# Patient Record
Sex: Female | Born: 1985 | Race: White | Hispanic: No | Smoking: Never smoker
Health system: Southern US, Community
[De-identification: ages and names within clinical notes are randomized; demographics above are authoritative.]

## PROBLEM LIST (undated history)

## (undated) DIAGNOSIS — F99 Mental disorder, not otherwise specified: Secondary | ICD-10-CM

## (undated) DIAGNOSIS — J45909 Unspecified asthma, uncomplicated: Secondary | ICD-10-CM

## (undated) DIAGNOSIS — F32A Depression, unspecified: Secondary | ICD-10-CM

## (undated) DIAGNOSIS — T7840XA Allergy, unspecified, initial encounter: Secondary | ICD-10-CM

## (undated) DIAGNOSIS — Z9889 Other specified postprocedural states: Secondary | ICD-10-CM

## (undated) DIAGNOSIS — F419 Anxiety disorder, unspecified: Secondary | ICD-10-CM

## (undated) DIAGNOSIS — R87629 Unspecified abnormal cytological findings in specimens from vagina: Secondary | ICD-10-CM

## (undated) HISTORY — DX: Unspecified asthma, uncomplicated: J45.909

## (undated) HISTORY — PX: WISDOM TOOTH EXTRACTION: SHX21

## (undated) HISTORY — DX: Allergy, unspecified, initial encounter: T78.40XA

## (undated) HISTORY — DX: Unspecified abnormal cytological findings in specimens from vagina: R87.629

## (undated) HISTORY — DX: Depression, unspecified: F32.A

## (undated) HISTORY — DX: Anxiety disorder, unspecified: F41.9

## (undated) HISTORY — DX: Mental disorder, not otherwise specified: F99

## (undated) HISTORY — PX: BUNIONECTOMY: SHX129

## (undated) HISTORY — DX: Other specified postprocedural states: Z98.890

---

## 2009-09-20 ENCOUNTER — Ambulatory Visit: Payer: Self-pay | Admitting: Psychiatry

## 2009-09-25 ENCOUNTER — Ambulatory Visit (HOSPITAL_COMMUNITY): Admission: RE | Admit: 2009-09-25 | Discharge: 2009-09-25 | Payer: Self-pay | Admitting: Psychiatry

## 2009-09-26 ENCOUNTER — Other Ambulatory Visit (HOSPITAL_COMMUNITY): Admission: RE | Admit: 2009-09-26 | Discharge: 2009-10-05 | Payer: Self-pay | Admitting: Psychiatry

## 2009-09-28 ENCOUNTER — Ambulatory Visit: Payer: Self-pay | Admitting: Psychiatry

## 2010-06-29 ENCOUNTER — Emergency Department (HOSPITAL_COMMUNITY)
Admission: EM | Admit: 2010-06-29 | Discharge: 2010-06-29 | Disposition: A | Discharge status: Home / Self Care | Payer: PRIVATE HEALTH INSURANCE | Attending: Emergency Medicine | Admitting: Emergency Medicine

## 2010-06-29 DIAGNOSIS — R0609 Other forms of dyspnea: Secondary | ICD-10-CM | POA: Insufficient documentation

## 2010-06-29 DIAGNOSIS — F319 Bipolar disorder, unspecified: Secondary | ICD-10-CM | POA: Insufficient documentation

## 2010-06-29 DIAGNOSIS — R0989 Other specified symptoms and signs involving the circulatory and respiratory systems: Secondary | ICD-10-CM | POA: Insufficient documentation

## 2010-06-29 DIAGNOSIS — J45909 Unspecified asthma, uncomplicated: Secondary | ICD-10-CM | POA: Insufficient documentation

## 2010-06-29 DIAGNOSIS — J3489 Other specified disorders of nose and nasal sinuses: Secondary | ICD-10-CM | POA: Insufficient documentation

## 2010-06-29 DIAGNOSIS — Z79899 Other long term (current) drug therapy: Secondary | ICD-10-CM | POA: Insufficient documentation

## 2018-05-06 ENCOUNTER — Ambulatory Visit (INDEPENDENT_AMBULATORY_CARE_PROVIDER_SITE_OTHER): Payer: BC Managed Care – PPO | Admitting: Advanced Practice Midwife

## 2018-05-06 ENCOUNTER — Encounter: Payer: Self-pay | Admitting: Advanced Practice Midwife

## 2018-05-06 VITALS — BP 133/85 | HR 102 | Ht 65.0 in | Wt 252.0 lb

## 2018-05-06 DIAGNOSIS — N898 Other specified noninflammatory disorders of vagina: Secondary | ICD-10-CM | POA: Diagnosis not present

## 2018-05-06 DIAGNOSIS — N760 Acute vaginitis: Secondary | ICD-10-CM

## 2018-05-06 DIAGNOSIS — Z1151 Encounter for screening for human papillomavirus (HPV): Secondary | ICD-10-CM | POA: Diagnosis not present

## 2018-05-06 DIAGNOSIS — Z01419 Encounter for gynecological examination (general) (routine) without abnormal findings: Secondary | ICD-10-CM | POA: Diagnosis not present

## 2018-05-06 DIAGNOSIS — Z3009 Encounter for other general counseling and advice on contraception: Secondary | ICD-10-CM | POA: Insufficient documentation

## 2018-05-06 DIAGNOSIS — Z124 Encounter for screening for malignant neoplasm of cervix: Secondary | ICD-10-CM | POA: Diagnosis not present

## 2018-05-06 DIAGNOSIS — Z113 Encounter for screening for infections with a predominantly sexual mode of transmission: Secondary | ICD-10-CM | POA: Diagnosis not present

## 2018-05-06 DIAGNOSIS — B9689 Other specified bacterial agents as the cause of diseases classified elsewhere: Secondary | ICD-10-CM

## 2018-05-06 MED ORDER — NORGESTIMATE-ETH ESTRADIOL 0.25-35 MG-MCG PO TABS: 1.0000 | ORAL_TABLET | Freq: Every day | ORAL | 11 refills | Status: DC

## 2018-05-06 NOTE — Patient Instructions (Signed)
Preventive Care 18-39 Years, Female Preventive care refers to lifestyle choices and visits with your health care provider that can promote health and wellness. What does preventive care include?   A yearly physical exam. This is also called an annual well check.  Dental exams once or twice a year.  Routine eye exams. Ask your health care provider how often you should have your eyes checked.  Personal lifestyle choices, including: ? Daily care of your teeth and gums. ? Regular physical activity. ? Eating a healthy diet. ? Avoiding tobacco and drug use. ? Limiting alcohol use. ? Practicing safe sex. ? Taking vitamin and mineral supplements as recommended by your health care provider. What happens during an annual well check? The services and screenings done by your health care provider during your annual well check will depend on your age, overall health, lifestyle risk factors, and family history of disease. Counseling Your health care provider may ask you questions about your:  Alcohol use.  Tobacco use.  Drug use.  Emotional well-being.  Home and relationship well-being.  Sexual activity.  Eating habits.  Work and work environment.  Method of birth control.  Menstrual cycle.  Pregnancy history. Screening You may have the following tests or measurements:  Height, weight, and BMI.  Diabetes screening. This is done by checking your blood sugar (glucose) after you have not eaten for a while (fasting).  Blood pressure.  Lipid and cholesterol levels. These may be checked every 5 years starting at age 20.  Skin check.  Hepatitis C blood test.  Hepatitis B blood test.  Sexually transmitted disease (STD) testing.  BRCA-related cancer screening. This may be done if you have a family history of breast, ovarian, tubal, or peritoneal cancers.  Pelvic exam and Pap test. This may be done every 3 years starting at age 21. Starting at age 30, this may be done every 5  years if you have a Pap test in combination with an HPV test. Discuss your test results, treatment options, and if necessary, the need for more tests with your health care provider. Vaccines Your health care provider may recommend certain vaccines, such as:  Influenza vaccine. This is recommended every year.  Tetanus, diphtheria, and acellular pertussis (Tdap, Td) vaccine. You may need a Td booster every 10 years.  Varicella vaccine. You may need this if you have not been vaccinated.  HPV vaccine. If you are 26 or younger, you may need three doses over 6 months.  Measles, mumps, and rubella (MMR) vaccine. You may need at least one dose of MMR. You may also need a second dose.  Pneumococcal 13-valent conjugate (PCV13) vaccine. You may need this if you have certain conditions and were not previously vaccinated.  Pneumococcal polysaccharide (PPSV23) vaccine. You may need one or two doses if you smoke cigarettes or if you have certain conditions.  Meningococcal vaccine. One dose is recommended if you are age 19-21 years and a first-year college student living in a residence hall, or if you have one of several medical conditions. You may also need additional booster doses.  Hepatitis A vaccine. You may need this if you have certain conditions or if you travel or work in places where you may be exposed to hepatitis A.  Hepatitis B vaccine. You may need this if you have certain conditions or if you travel or work in places where you may be exposed to hepatitis B.  Haemophilus influenzae type b (Hib) vaccine. You may need this if you   have certain risk factors. Talk to your health care provider about which screenings and vaccines you need and how often you need them. This information is not intended to replace advice given to you by your health care provider. Make sure you discuss any questions you have with your health care provider. Document Released: 05/14/2001 Document Revised: 10/29/2016  Document Reviewed: 01/17/2015 Elsevier Interactive Patient Education  2019 [employer].

## 2018-05-06 NOTE — Progress Notes (Signed)
GYNECOLOGY ANNUAL PREVENTATIVE CARE ENCOUNTER NOTE  Subjective:   Yolanda Tyler is a 33 y.o. G0P0000 female here for a routine annual gynecologic exam.  Current complaints: none.   Denies abnormal vaginal bleeding, discharge, pelvic pain, problems with intercourse or other gynecologic concerns.    Patient endorses positive relationships and happiness regarding her sexual health. She has separate female and female partners, not currently in a relationship but has regular known partners. Not sure of partner testing status. She is a non-smoker, does not vape. Denies IPV.  Patient does not currently use birth control but is interested in starting it. She does not consider IUDs an option as she thinks her female partner will not have a positive response to it.  Gynecologic History Patient's last menstrual period was 04/22/2018 (within days). Contraception: none Last Pap: Remote. Results were: normal with negative HPV, Patient states prior pap was HPV positive Last mammogram: N/A age 33.   Obstetric History OB History  Gravida Para Term Preterm AB Living  0 0 0 0 0 0  SAB TAB Ectopic Multiple Live Births  0 0 0 0 0    Past Medical History:  Diagnosis Date  . History of colposcopy   . Mental disorder    depression and anxiety  . Vaginal Pap smear, abnormal     Past Surgical History:  Procedure Laterality Date  . BUNIONECTOMY    . WISDOM TOOTH EXTRACTION      Current Outpatient Medications on File Prior to Visit  Medication Sig Dispense Refill  . buPROPion (WELLBUTRIN XL) 300 MG 24 hr tablet Take 300 mg by mouth daily.    Marland Kitchen. desvenlafaxine (PRISTIQ) 50 MG 24 hr tablet Take 50 mg by mouth daily.    Marland Kitchen. lamoTRIgine (LAMICTAL) 100 MG tablet Take 100 mg by mouth daily.     No current facility-administered medications on file prior to visit.     Allergies  Allergen Reactions  . Penicillins Hives    Social History:  reports that she has never smoked. She has never used  smokeless tobacco. She reports previous alcohol use. She reports previous drug use.  History reviewed. No pertinent family history.  The following portions of the patient's history were reviewed and updated as appropriate: allergies, current medications, past family history, past medical history, past social history, past surgical history and problem list.  Review of Systems Pertinent items noted in HPI and remainder of comprehensive ROS otherwise negative.   Objective:  BP 133/85   Pulse (!) 102   Ht 5\' 5"  (1.651 m)   Wt 252 lb (114.3 kg)   LMP 04/22/2018 (Within Days)   BMI 41.93 kg/m  CONSTITUTIONAL: Well-developed, well-nourished female in no acute distress.  HENT:  Normocephalic, atraumatic, External right and left ear normal. Oropharynx is clear and moist EYES: Conjunctivae and EOM are normal. Pupils are equal, round, and reactive to light. No scleral icterus.  NECK: Normal range of motion, supple, no masses.  Normal thyroid.  SKIN: Skin is warm and dry. No rash noted. Not diaphoretic. No erythema. No pallor. MUSCULOSKELETAL: Normal range of motion. No tenderness.  No cyanosis, clubbing, or edema.  2+ distal pulses. NEUROLOGIC: Alert and oriented to person, place, and time. Normal reflexes, muscle tone coordination. No cranial nerve deficit noted. PSYCHIATRIC: Normal mood and affect. Normal behavior. Normal judgment and thought content. CARDIOVASCULAR: Normal heart rate noted, regular rhythm RESPIRATORY: Clear to auscultation bilaterally. Effort and breath sounds normal, no problems with respiration noted. BREASTS: Symmetric  in size. No masses, skin changes, nipple drainage, or lymphadenopathy. ABDOMEN: Soft, normal bowel sounds, no distention noted.  No tenderness, rebound or guarding.  PELVIC: Normal appearing external genitalia; normal appearing vaginal mucosa and cervix.  No abnormal discharge noted.  Pap smear obtained.  Normal uterine size, no other palpable masses, no  uterine or adnexal tenderness.    Assessment and Plan:  1. Well woman exam with routine gynecological exam - Reviewed all forms of birth control options available based on history and BMI as advised by CDC, focusing on IUD, OCPs and condoms per patient request. Risks and benefits reviewed.  Questions were answered.  Information was given to patient to review.  - Rx Sprintec - Cytology - PAP( [place]) - Hepatitis B surface antigen - Hepatitis C antibody - HIV Antibody (routine testing w rflx) - RPR - Cytology - PAP  Will follow up results of pap smear and manage accordingly. Routine preventative health maintenance measures emphasized. Please refer to After Visit Summary for other counseling recommendations.   Clayton BiblesSamantha Jakerria Kingbird, Certified Nurse Midwife [facility], [facility]

## 2018-05-06 NOTE — Progress Notes (Signed)
Would like STD testing Last PAP was 2014 was normal, had previous Pap that was abnormal had a colposcopy done and had normal Pap after.

## 2018-05-07 LAB — HEPATITIS B SURFACE ANTIGEN: Hepatitis B Surface Ag: NEGATIVE

## 2018-05-07 LAB — HIV ANTIBODY (ROUTINE TESTING W REFLEX): HIV Screen 4th Generation wRfx: NONREACTIVE

## 2018-05-07 LAB — HEPATITIS C ANTIBODY

## 2018-05-07 LAB — RPR: RPR Ser Ql: NONREACTIVE

## 2018-05-11 ENCOUNTER — Telehealth: Payer: Self-pay

## 2018-05-11 LAB — CYTOLOGY - PAP
ADEQUACY: ABSENT
Bacterial vaginitis: POSITIVE — AB
Candida vaginitis: NEGATIVE
Chlamydia: NEGATIVE
Diagnosis: NEGATIVE
HPV: NOT DETECTED
Neisseria Gonorrhea: NEGATIVE
Trichomonas: NEGATIVE

## 2018-05-11 MED ORDER — METRONIDAZOLE 500 MG PO TABS: 500.0000 mg | ORAL_TABLET | Freq: Two times a day (BID) | ORAL | 0 refills | Status: DC

## 2018-05-11 NOTE — Telephone Encounter (Signed)
-----   Message from Calvert Cantor, [place] sent at 05/11/2018  9:47 AM EST ----- Regarding: FW: Please notify patient of BV and ask if she'd like treatment. If so she can have flagyl or she can just manage with lactobacillus supplement. Thanks! ----- Message ----- From: [facility] Lab Results In Sent: 05/07/2018   5:41 AM EST To: Calvert Cantor, CNM

## 2018-05-11 NOTE — Telephone Encounter (Signed)
Patient has BV. I have left a message for her to call us back regarding results.

## 2019-03-10 ENCOUNTER — Encounter: Payer: Self-pay | Admitting: Radiology

## 2019-12-29 ENCOUNTER — Ambulatory Visit (INDEPENDENT_AMBULATORY_CARE_PROVIDER_SITE_OTHER): Payer: BC Managed Care – PPO | Admitting: Advanced Practice Midwife

## 2019-12-29 ENCOUNTER — Encounter: Payer: Self-pay | Admitting: Advanced Practice Midwife

## 2019-12-29 ENCOUNTER — Other Ambulatory Visit: Payer: Self-pay

## 2019-12-29 VITALS — BP 148/83 | HR 88 | Ht 65.0 in | Wt 269.8 lb

## 2019-12-29 DIAGNOSIS — Z0000 Encounter for general adult medical examination without abnormal findings: Secondary | ICD-10-CM | POA: Diagnosis not present

## 2019-12-29 DIAGNOSIS — R030 Elevated blood-pressure reading, without diagnosis of hypertension: Secondary | ICD-10-CM

## 2019-12-29 DIAGNOSIS — Z6841 Body Mass Index (BMI) 40.0 and over, adult: Secondary | ICD-10-CM

## 2019-12-29 DIAGNOSIS — Z319 Encounter for procreative management, unspecified: Secondary | ICD-10-CM

## 2019-12-29 DIAGNOSIS — E66813 Obesity, class 3: Secondary | ICD-10-CM

## 2019-12-29 NOTE — Patient Instructions (Signed)
Preventive Care 21-33 Years Old, Female Preventive care refers to visits with your health care provider and lifestyle choices that can promote health and wellness. This includes:  A yearly physical exam. This may also be called an annual well check.  Regular dental visits and eye exams.  Immunizations.  Screening for certain conditions.  Healthy lifestyle choices, such as eating a healthy diet, getting regular exercise, not using drugs or products that contain nicotine and tobacco, and limiting alcohol use. What can I expect for my preventive care visit? Physical exam Your health care provider will check your:  Height and weight. This may be used to calculate body mass index (BMI), which tells if you are at a healthy weight.  Heart rate and blood pressure.  Skin for abnormal spots. Counseling Your health care provider may ask you questions about your:  Alcohol, tobacco, and drug use.  Emotional well-being.  Home and relationship well-being.  Sexual activity.  Eating habits.  Work and work environment.  Method of birth control.  Menstrual cycle.  Pregnancy history. What immunizations do I need?  Influenza (flu) vaccine  This is recommended every year. Tetanus, diphtheria, and pertussis (Tdap) vaccine  You may need a Td booster every 10 years. Varicella (chickenpox) vaccine  You may need this if you have not been vaccinated. Human papillomavirus (HPV) vaccine  If recommended by your health care provider, you may need three doses over 6 months. Measles, mumps, and rubella (MMR) vaccine  You may need at least one dose of MMR. You may also need a second dose. Meningococcal conjugate (MenACWY) vaccine  One dose is recommended if you are age 19-21 years and a first-year college student living in a residence hall, or if you have one of several medical conditions. You may also need additional booster doses. Pneumococcal conjugate (PCV13) vaccine  You may need  this if you have certain conditions and were not previously vaccinated. Pneumococcal polysaccharide (PPSV23) vaccine  You may need one or two doses if you smoke cigarettes or if you have certain conditions. Hepatitis A vaccine  You may need this if you have certain conditions or if you travel or work in places where you may be exposed to hepatitis A. Hepatitis B vaccine  You may need this if you have certain conditions or if you travel or work in places where you may be exposed to hepatitis B. Haemophilus influenzae type b (Hib) vaccine  You may need this if you have certain conditions. You may receive vaccines as individual doses or as more than one vaccine together in one shot (combination vaccines). Talk with your health care provider about the risks and benefits of combination vaccines. What tests do I need?  Blood tests  Lipid and cholesterol levels. These may be checked every 5 years starting at age 20.  Hepatitis C test.  Hepatitis B test. Screening  Diabetes screening. This is done by checking your blood sugar (glucose) after you have not eaten for a while (fasting).  Sexually transmitted disease (STD) testing.  BRCA-related cancer screening. This may be done if you have a family history of breast, ovarian, tubal, or peritoneal cancers.  Pelvic exam and Pap test. This may be done every 3 years starting at age 21. Starting at age 30, this may be done every 5 years if you have a Pap test in combination with an HPV test. Talk with your health care provider about your test results, treatment options, and if necessary, the need for more tests.   Follow these instructions at home: °Eating and drinking ° °· Eat a diet that includes fresh fruits and vegetables, whole grains, lean protein, and low-fat dairy. °· Take vitamin and mineral supplements as recommended by your health care provider. °· Do not drink alcohol if: °? Your health care provider tells you not to drink. °? You are  pregnant, may be pregnant, or are planning to become pregnant. °· If you drink alcohol: °? Limit how much you have to 0-1 drink a day. °? Be aware of how much alcohol is in your drink. In the [place] one drink equals one 12 oz bottle of beer (355 mL), one 5 oz glass of wine (148 mL), or one 1½ oz glass of hard liquor (44 mL). °Lifestyle °· Take daily care of your teeth and gums. °· Stay active. Exercise for at least 30 minutes on 5 or more days each week. °· Do not use any products that contain nicotine or tobacco, such as cigarettes, e-cigarettes, and chewing tobacco. If you need help quitting, ask your health care provider. °· If you are sexually active, practice safe sex. Use a condom or other form of birth control (contraception) in order to prevent pregnancy and STIs (sexually transmitted infections). If you plan to become pregnant, see your health care provider for a preconception visit. °What's next? °· Visit your health care provider once a year for a well check visit. °· Ask your health care provider how often you should have your eyes and teeth checked. °· Stay up to date on all vaccines. °This information is not intended to replace advice given to you by your health care provider. Make sure you discuss any questions you have with your health care provider. °Document Revised: 11/27/2017 Document Reviewed: 11/27/2017 °Elsevier Patient Education © 2020 Elsevier Inc. ° °

## 2019-12-29 NOTE — Progress Notes (Signed)
GYNECOLOGY ANNUAL PREVENTATIVE CARE ENCOUNTER NOTE  History:     Yolanda Tyler is a 34 y.o. G0P0000 female here for a routine annual gynecologic exam.  Current complaints: None.  Desires pregnancy in 2022. Female partner, monogamous x 2 years. Never took Sprintec due to [employer] and absence of female partner. Denies abnormal vaginal bleeding, discharge, pelvic pain, problems with intercourse or other gynecologic concerns.    Gynecologic History Patient's last menstrual period was 12/21/2019 (approximate). Contraception: none Last Pap: 05/06/2018. Results were: normal with negative HPV Last mammogram: N/A age 45.  Obstetric History OB History  Gravida Para Term Preterm AB Living  0 0 0 0 0 0  SAB TAB Ectopic Multiple Live Births  0 0 0 0 0    Past Medical History:  Diagnosis Date  . History of colposcopy   . Mental disorder    depression and anxiety  . Vaginal Pap smear, abnormal     Past Surgical History:  Procedure Laterality Date  . BUNIONECTOMY    . WISDOM TOOTH EXTRACTION      Current Outpatient Medications on File Prior to Visit  Medication Sig Dispense Refill  . buPROPion (WELLBUTRIN XL) 300 MG 24 hr tablet Take 300 mg by mouth daily.    Marland Kitchen desvenlafaxine (PRISTIQ) 50 MG 24 hr tablet Take 50 mg by mouth daily.    Marland Kitchen lamoTRIgine (LAMICTAL) 100 MG tablet Take 100 mg by mouth daily.    . traZODone (DESYREL) 100 MG tablet Take 100 mg by mouth at bedtime.    . metroNIDAZOLE (FLAGYL) 500 MG tablet Take 1 tablet (500 mg total) by mouth 2 (two) times daily. 14 tablet 0  . norgestimate-ethinyl estradiol (ORTHO-CYCLEN,SPRINTEC,PREVIFEM) 0.25-35 MG-MCG tablet Take 1 tablet by mouth daily. 1 Package 11   No current facility-administered medications on file prior to visit.    Allergies  Allergen Reactions  . Metformin And Related Diarrhea  . Penicillins Hives    Social History:  reports that she has never smoked. She has never used smokeless tobacco. She  reports previous alcohol use. She reports previous drug use.  No family history on file.  The following portions of the patient's history were reviewed and updated as appropriate: allergies, current medications, past family history, past medical history, past social history, past surgical history and problem list.  Review of Systems Pertinent items noted in HPI and remainder of comprehensive ROS otherwise negative.  Physical Exam:  BP (!) 148/83   Pulse 88   Ht 5\' 5"  (1.651 m)   Wt 269 lb 12.8 oz (122.4 kg)   LMP 12/21/2019 (Approximate)   BMI 44.90 kg/m  CONSTITUTIONAL: Well-developed, well-nourished female in no acute distress.  HENT:  Normocephalic, atraumatic, External right and left ear normal. Oropharynx is clear and moist EYES: Conjunctivae and EOM are normal. Pupils are equal, round, and reactive to light. No scleral icterus.  NECK: Normal range of motion, supple, no masses.  Normal thyroid.  SKIN: Skin is warm and dry. No rash noted. Not diaphoretic. No erythema. No pallor. MUSCULOSKELETAL: Normal range of motion. No tenderness.  No cyanosis, clubbing, or edema.  2+ distal pulses. NEUROLOGIC: Alert and oriented to person, place, and time. Normal reflexes, muscle tone coordination.  PSYCHIATRIC: Normal mood and affect. Normal behavior. Normal judgment and thought content. CARDIOVASCULAR: Normal heart rate noted, regular rhythm RESPIRATORY: Clear to auscultation bilaterally. Effort and breath sounds normal, no problems with respiration noted. BREASTS: Symmetric in size. No masses, tenderness, skin changes, nipple  drainage, or lymphadenopathy bilaterally. Performed in the presence of a chaperone. ABDOMEN: Soft, no distention noted.  No tenderness, rebound or guarding.    Assessment and Plan:    1. Well woman exam (no gynecological exam) - No concerning findings on physical exam - Pap not due, no GU complaints, pelvic deferred - Encouraged breast self exams - CBC - TSH -  Hemoglobin A1c - Comprehensive metabolic panel - Lipid panel - VITAMIN D 25 Hydroxy (Vit-D Deficiency, Fractures)   2. Class 3 severe obesity without serious comorbidity with body mass index (BMI) of 40.0 to 44.9 in adult, unspecified obesity type (HCC) - Needs PCP - Referral to Nutrition and Diabetes Services - Ambulatory referral to [facility]  3. Elevated blood pressure reading in office without diagnosis of hypertension   4. Patient desires pregnancy --Discussed methods for reducing risk factors before she starts TTC in 2022  Total visit time: 30 minutes. Greater than 50% of visit spent in counseling and coordination of care  Routine preventative health maintenance measures emphasized. Please refer to After Visit Summary for other counseling recommendations.      Clayton Bibles, MSN, CNM Certified Nurse Midwife, [facility] for [employer], [facility] 12/29/19 9:14 AM

## 2019-12-30 ENCOUNTER — Telehealth: Payer: Self-pay

## 2019-12-30 LAB — COMPREHENSIVE METABOLIC PANEL
ALT: 28 IU/L (ref 0–32)
AST: 17 IU/L (ref 0–40)
Albumin/Globulin Ratio: 1.7 (ref 1.2–2.2)
Albumin: 4.3 g/dL (ref 3.8–4.8)
Alkaline Phosphatase: 80 IU/L (ref 44–121)
BUN/Creatinine Ratio: 9 (ref 9–23)
BUN: 7 mg/dL (ref 6–20)
Bilirubin Total: 0.4 mg/dL (ref 0.0–1.2)
CO2: 23 mmol/L (ref 20–29)
Calcium: 9.2 mg/dL (ref 8.7–10.2)
Chloride: 99 mmol/L (ref 96–106)
Creatinine, Ser: 0.78 mg/dL (ref 0.57–1.00)
GFR calc Af Amer: 116 mL/min/{1.73_m2} (ref >59)
GFR calc non Af Amer: 100 mL/min/{1.73_m2} (ref >59)
Globulin, Total: 2.6 g/dL (ref 1.5–4.5)
Glucose: 107 mg/dL — ABNORMAL HIGH (ref 65–99)
Potassium: 4.2 mmol/L (ref 3.5–5.2)
Sodium: 137 mmol/L (ref 134–144)
Total Protein: 6.9 g/dL (ref 6.0–8.5)

## 2019-12-30 LAB — CBC
Hematocrit: 39.9 % (ref 34.0–46.6)
Hemoglobin: 12.7 g/dL (ref 11.1–15.9)
MCH: 27.7 pg (ref 26.6–33.0)
MCHC: 31.8 g/dL (ref 31.5–35.7)
MCV: 87 fL (ref 79–97)
Platelets: 339 10*3/uL (ref 150–450)
RBC: 4.58 x10E6/uL (ref 3.77–5.28)
RDW: 13.1 % (ref 11.7–15.4)
WBC: 7.2 10*3/uL (ref 3.4–10.8)

## 2019-12-30 LAB — LIPID PANEL
Chol/HDL Ratio: 3.5 ratio (ref 0.0–4.4)
Cholesterol, Total: 205 mg/dL — ABNORMAL HIGH (ref 100–199)
HDL: 58 mg/dL (ref >39)
LDL Chol Calc (NIH): 129 mg/dL — ABNORMAL HIGH (ref 0–99)
Triglycerides: 100 mg/dL (ref 0–149)
VLDL Cholesterol Cal: 18 mg/dL (ref 5–40)

## 2019-12-30 LAB — HEMOGLOBIN A1C
Est. average glucose Bld gHb Est-mCnc: 128 mg/dL
Hgb A1c MFr Bld: 6.1 % — ABNORMAL HIGH (ref 4.8–5.6)

## 2019-12-30 LAB — TSH: TSH: 1.37 u[IU]/mL (ref 0.450–4.500)

## 2019-12-30 LAB — VITAMIN D 25 HYDROXY (VIT D DEFICIENCY, FRACTURES): Vit D, 25-Hydroxy: 20.1 ng/mL — ABNORMAL LOW (ref 30.0–100.0)

## 2019-12-30 NOTE — Telephone Encounter (Signed)
Called patient and informed her of results. Patient voiced understanding.

## 2019-12-30 NOTE — Telephone Encounter (Signed)
-----   Message from Calvert Cantor, [place] sent at 12/30/2019  9:04 AM EDT ----- Prediabetic, elevated Cholesterol, already has appointment with Nutrition and referral to establish care with PCP. Can you plz offer daily Vitamin D3 1,000 units daily for low Vitamin D? Thanks! SW

## 2020-01-25 ENCOUNTER — Ambulatory Visit: Payer: BC Managed Care – PPO | Admitting: Skilled Nursing Facility1

## 2020-01-26 ENCOUNTER — Other Ambulatory Visit: Payer: Self-pay

## 2020-01-26 ENCOUNTER — Ambulatory Visit
Admission: RE | Admit: 2020-01-26 | Discharge: 2020-01-26 | Disposition: A | Discharge status: Home / Self Care | Payer: BC Managed Care – PPO | Source: Ambulatory Visit | Attending: Physician Assistant | Admitting: Physician Assistant

## 2020-01-26 ENCOUNTER — Encounter: Payer: Self-pay | Admitting: Physician Assistant

## 2020-01-26 ENCOUNTER — Ambulatory Visit: Payer: BC Managed Care – PPO | Admitting: Physician Assistant

## 2020-01-26 ENCOUNTER — Ambulatory Visit
Admission: RE | Admit: 2020-01-26 | Discharge: 2020-01-26 | Disposition: A | Discharge status: Home / Self Care | Payer: BC Managed Care – PPO | Attending: Physician Assistant | Admitting: Physician Assistant

## 2020-01-26 VITALS — BP 143/88 | HR 90 | Temp 98.2°F | Ht 65.5 in | Wt 262.0 lb

## 2020-01-26 DIAGNOSIS — Z227 Latent tuberculosis: Secondary | ICD-10-CM | POA: Insufficient documentation

## 2020-01-26 DIAGNOSIS — R7303 Prediabetes: Secondary | ICD-10-CM

## 2020-01-26 DIAGNOSIS — R030 Elevated blood-pressure reading, without diagnosis of hypertension: Secondary | ICD-10-CM

## 2020-01-26 DIAGNOSIS — E785 Hyperlipidemia, unspecified: Secondary | ICD-10-CM

## 2020-01-26 MED ORDER — METFORMIN HCL ER 500 MG PO TB24: ORAL_TABLET | ORAL | 0 refills | Status: DC

## 2020-01-26 NOTE — Progress Notes (Signed)
New patient visit   Patient: Yolanda Tyler   DOB: 1986/02/13   34 y.o. Female  MRN: [id number] Visit Date: 01/26/2020  Today's healthcare provider: Trey Sailors, PA-C   Chief Complaint  Patient presents with  . New Patient (Initial Visit)  I,Yolanda Tyler M Yolanda Tyler,acting as a scribe for Trey Sailors, PA-C.,have documented all relevant documentation on the behalf of Trey Sailors, PA-C,as directed by  Trey Sailors, PA-C while in the presence of Trey Sailors, PA-C.  Subjective    Yolanda Tyler is a 34 y.o. female who presents today as a new patient to establish care.  HPI  Patient reports she was seen by her OBGYN doctor and had blood work. She reports that her doctor told her Vitamin D was low and to started OTC vit D medication. Also her blood pressure was a little elevated, cholesterol,lipid and A1C was abnormal/ elevated.  She reports currently taking Vit D supplement OTC 2,000.  History of latent TB, back in [place], one month of treatment but moved to [facility] in March 2017 and stopped treatment .  She currently sees a psychiatrist with [facility] and sees [facility]  and is being treated with wellbutrin, lamictal, and prestiq. She is being treated for depression, dysthymia, anxiety. She sees this provider once a month.   She was recently seen by her OBGYN and diagnosed with high cholesterol, elevated BP and prediabetes. She has an upcoming appointment with nutrition on 01/28/2020 to get on weight loss program. She believes she had a previous trial of metformin but is unable to remember if it was IR or ER. She intends to become pregnant next year.   BP Readings from Last 5 Encounters:  01/26/20 (!) 143/88  12/29/19 (!) 148/83  05/06/18 133/85   Wt Readings from Last 3 Encounters:  01/26/20 262 lb (118.8 kg)  12/29/19 269 lb 12.8 oz (122.4 kg)  05/06/18 252 lb (114.3 kg)   BMI Readings from Last 5 Encounters:  01/26/20 42.94 kg/m  12/29/19 44.90  kg/m  05/06/18 41.93 kg/m     Last vitamin D Lab Results  Component Value Date   VD25OH 20.1 (L) 12/29/2019   Lab Results  Component Value Date   CHOL 205 (H) 12/29/2019   Lab Results  Component Value Date   HDL 58 12/29/2019   Lab Results  Component Value Date   LDLCALC 129 (H) 12/29/2019   Lab Results  Component Value Date   TRIG 100 12/29/2019   Lab Results  Component Value Date   CHOLHDL 3.5 12/29/2019   No results found for: LDLDIRECT Lab Results  Component Value Date   HGBA1C 6.1 (H) 12/29/2019       Past Medical History:  Diagnosis Date  . Allergy   . Anxiety   . Asthma   . Depression   . History of colposcopy   . Mental disorder    depression and anxiety  . Vaginal Pap smear, abnormal    Past Surgical History:  Procedure Laterality Date  . BUNIONECTOMY    . WISDOM TOOTH EXTRACTION     Family Status  Relation Name Status  . Mother  (Not Specified)  . Father  (Not Specified)  . MGF  (Not Specified)  . PGM  (Not Specified)   Family History  Problem Relation Age of Onset  . Graves' disease Mother   . Depression Father   . Bone cancer Maternal Grandfather   . Heart  disease Paternal Grandmother    Social History   Socioeconomic History  . Marital status: Media planner    Spouse name: Not on file  . Number of children: Not on file  . Years of education: Not on file  . Highest education level: Not on file  Occupational History  . Not on file  Tobacco Use  . Smoking status: Never Smoker  . Smokeless tobacco: Never Used  Vaping Use  . Vaping Use: Never used  Substance and Sexual Activity  . Alcohol use: Not Currently  . Drug use: Not Currently  . Sexual activity: Yes    Birth control/protection: None  Other Topics Concern  . Not on file  Social History Narrative  . Not on file   Social Determinants of Health   Financial Resource Strain:   . Difficulty of Paying Living Expenses: Not on file  Food Insecurity:   .  Worried About Programme researcher, broadcasting/film/video in the Last Year: Not on file  . Ran Out of Food in the Last Year: Not on file  Transportation Needs:   . Lack of Transportation (Medical): Not on file  . Lack of Transportation (Non-Medical): Not on file  Physical Activity:   . Days of Exercise per Week: Not on file  . Minutes of Exercise per Session: Not on file  Stress:   . Feeling of Stress : Not on file  Social Connections:   . Frequency of Communication with Friends and Family: Not on file  . Frequency of Social Gatherings with Friends and Family: Not on file  . Attends Religious Services: Not on file  . Active Member of Clubs or Organizations: Not on file  . Attends Banker Meetings: Not on file  . Marital Status: Not on file   Outpatient Medications Prior to Visit  Medication Sig  . buPROPion (WELLBUTRIN XL) 150 MG 24 hr tablet Take 150 mg by mouth daily.  Marland Kitchen desvenlafaxine (PRISTIQ) 100 MG 24 hr tablet Take 100 mg by mouth daily.  Marland Kitchen lamoTRIgine (LAMICTAL) 100 MG tablet Take 100 mg by mouth daily.  . traZODone (DESYREL) 100 MG tablet Take 100 mg by mouth at bedtime.  . traZODone (DESYREL) 25 mg TABS tablet   . buPROPion (WELLBUTRIN XL) 300 MG 24 hr tablet Take 300 mg by mouth daily. (Patient not taking: Reported on 01/26/2020)  . desvenlafaxine (PRISTIQ) 50 MG 24 hr tablet Take 50 mg by mouth daily. (Patient not taking: Reported on 01/26/2020)   No facility-administered medications prior to visit.   Allergies  Allergen Reactions  . Metformin And Related Diarrhea  . Penicillins Hives  . Clindamycin/Lincomycin Rash     There is no immunization history on file for this patient.  Health Maintenance  Topic Date Due  . COVID-19 Vaccine (1) Never done  . TETANUS/TDAP  Never done  . INFLUENZA VACCINE  10/31/2019  . PAP SMEAR-Modifier  05/07/2023  . Hepatitis C Screening  Completed  . HIV Screening  Completed    Patient Care Team: Maryella Shivers as PCP - General  (Physician Assistant)  Review of Systems  Constitutional: Negative.   HENT: Negative.   Eyes: Negative.   Respiratory: Negative.   Cardiovascular: Negative.   Gastrointestinal: Negative.   Endocrine: Negative.   Genitourinary: Negative.   Musculoskeletal: Negative.   Skin: Negative.   Allergic/Immunologic: Negative.   Neurological: Negative.   Hematological: Negative.   Psychiatric/Behavioral: Negative.       Objective    BP Marland Kitchen)  143/88 (BP Location: Left Arm, Patient Position: Sitting, Cuff Size: Large)   Pulse 90   Temp 98.2 F (36.8 C) (Oral)   Ht 5' 5.5" (1.664 m)   Wt 262 lb (118.8 kg)   LMP 01/12/2020   SpO2 100%   BMI 42.94 kg/m  Physical Exam Constitutional:      Appearance: Normal appearance.  HENT:     Right Ear: Tympanic membrane, ear canal and external ear normal.     Left Ear: Tympanic membrane, ear canal and external ear normal.  Cardiovascular:     Rate and Rhythm: Normal rate and regular rhythm.     Pulses: Normal pulses.     Heart sounds: Normal heart sounds.  Pulmonary:     Effort: Pulmonary effort is normal.     Breath sounds: Normal breath sounds.  Abdominal:     General: Abdomen is flat. Bowel sounds are normal.     Palpations: Abdomen is soft.  Skin:    General: Skin is warm and dry.  Neurological:     General: No focal deficit present.     Mental Status: She is alert and oriented to person, place, and time.  Psychiatric:        Mood and Affect: Mood normal.        Behavior: Behavior normal.      Depression Screen PHQ 2/9 Scores 01/28/2020 01/26/2020  PHQ - 2 Score 0 2  PHQ- 9 Score - 7   Results for orders placed or performed in visit on 01/26/20  QuantiFERON-TB Gold Plus  Result Value Ref Range   QuantiFERON Incubation Incubation performed.    QuantiFERON Criteria Comment    QuantiFERON TB1 Ag Value 0.03 IU/mL   QuantiFERON TB2 Ag Value 0.01 IU/mL   QuantiFERON Nil Value 0.02 IU/mL   QuantiFERON Mitogen Value >10.00 IU/mL    QuantiFERON-TB Gold Plus Negative Negative    Assessment & Plan     1. Prediabetes  Will start as below. She is meeting with nutritionist which I think is a wonderful idea. Counseled on metabolic disease with likely some contribution from psychiatric medication and how it contributes to the below issues. Will see her back in 3 months to recheck labs, weight, BP.  - metFORMIN (GLUCOPHAGE XR) 500 MG 24 hr tablet; Start with one pill daily for a week. Then can take one pill twice daily onward.  Dispense: 180 tablet; Refill: 0  2. Hyperlipidemia, unspecified hyperlipidemia type  See #1.  3. TB lung, latent  TB gold and chest xray negative.   - QuantiFERON-TB Gold Plus - DG Chest 2 View; Future  4. Morbid obesity (HCC)   5. Elevated BP without diagnosis of hypertension      I, Trey Sailors, PA-C, have reviewed all documentation for this visit. The documentation on 02/03/20 for the exam, diagnosis, procedures, and orders are all accurate and complete.  The entirety of the information documented in the History of Present Illness, Review of Systems and Physical Exam were personally obtained by me. Portions of this information were initially documented by Anson Oregon, CMA and reviewed by me for thoroughness and accuracy.     Maryella Shivers  [facility] [phone] (phone) [phone] (fax)  [facility]

## 2020-01-26 NOTE — Patient Instructions (Signed)
Prediabetes Prediabetes is the condition of having a blood sugar (blood glucose) level that is higher than it should be, but not high enough for you to be diagnosed with type 2 diabetes. Having prediabetes puts you at risk for developing type 2 diabetes (type 2 diabetes mellitus). Prediabetes may be called impaired glucose tolerance or impaired fasting glucose. Prediabetes usually does not cause symptoms. Your health care provider can diagnose this condition with blood tests. You may be tested for prediabetes if you are overweight and if you have at least one other risk factor for prediabetes. What is blood glucose, and how is it measured? Blood glucose refers to the amount of glucose in your bloodstream. Glucose comes from eating foods that contain sugars and starches (carbohydrates), which the body breaks down into glucose. Your blood glucose level may be measured in mg/dL (milligrams per deciliter) or mmol/L (millimoles per liter). Your blood glucose may be checked with one or more of the following blood tests:  A fasting blood glucose (FBG) test. You will not be allowed to eat (you will fast) for 8 hours or longer before a blood sample is taken. ? A normal range for FBG is 70-100 mg/dl (3.9-5.6 mmol/L).  An A1c (hemoglobin A1c) blood test. This test provides information about blood glucose control over the previous 2?3months.  An oral glucose tolerance test (OGTT). This test measures your blood glucose at two times: ? After fasting. This is your baseline level. ? Two hours after you drink a beverage that contains glucose. You may be diagnosed with prediabetes:  If your FBG is 100?125 mg/dL (5.6-6.9 mmol/L).  If your A1c level is 5.7?6.4%.  If your OGTT result is 140?199 mg/dL (7.8-11 mmol/L). These blood tests may be repeated to confirm your diagnosis. How can this condition affect me? The pancreas produces a hormone (insulin) that helps to move glucose from the bloodstream into cells.  When cells in the body do not respond properly to insulin that the body makes (insulin resistance), excess glucose builds up in the blood instead of going into cells. As a result, high blood glucose (hyperglycemia) can develop, which can cause many complications. Hyperglycemia is a symptom of prediabetes. Having high blood glucose for a long time is dangerous. Too much glucose in your blood can damage your nerves and blood vessels. Long-term damage can lead to complications from diabetes, which may include:  Heart disease.  Stroke.  Blindness.  Kidney disease.  Depression.  Poor circulation in the feet and legs, which could lead to surgical removal (amputation) in severe cases. What can increase my risk? Risk factors for prediabetes include:  Having a family member with type 2 diabetes.  Being overweight or obese.  Being older than age 45.  Being of American Indian, African-American, Hispanic/Latino, or Asian/Pacific Islander descent.  Having an inactive (sedentary) lifestyle.  Having a history of heart disease.  History of gestational diabetes or polycystic ovary syndrome (PCOS), in women.  Having low levels of good cholesterol (HDL-C) or high levels of blood fats (triglycerides).  Having high blood pressure. What actions can I take to prevent diabetes?      Be physically active. ? Do moderate-intensity physical activity for 30 or more minutes on 5 or more days of the week, or as much as told by your health care provider. This could be brisk walking, biking, or water aerobics. ? Ask your health care provider what activities are safe for you. A mix of physical activities may be best, such as   walking, swimming, cycling, and strength training.  Lose weight as told by your health care provider. ? Losing 5-7% of your body weight can reverse insulin resistance. ? Your health care provider can determine how much weight loss is best for you and can help you lose weight  safely.  Follow a healthy meal plan. This includes eating lean proteins, complex carbohydrates, fresh fruits and vegetables, low-fat dairy products, and healthy fats. ? Follow instructions from your health care provider about eating or drinking restrictions. ? Make an appointment to see a diet and nutrition specialist (registered dietitian) to help you create a healthy eating plan that is right for you.  Do not smoke or use any tobacco products, such as cigarettes, chewing tobacco, and e-cigarettes. If you need help quitting, ask your health care provider.  Take over-the-counter and prescription medicines as told by your health care provider. You may be prescribed medicines that help lower the risk of type 2 diabetes.  Keep all follow-up visits as told by your health care provider. This is important. Summary  Prediabetes is the condition of having a blood sugar (blood glucose) level that is higher than it should be, but not high enough for you to be diagnosed with type 2 diabetes.  Having prediabetes puts you at risk for developing type 2 diabetes (type 2 diabetes mellitus).  To help prevent type 2 diabetes, make lifestyle changes such as being physically active and eating a healthy diet. Lose weight as told by your health care provider. This information is not intended to replace advice given to you by your health care provider. Make sure you discuss any questions you have with your health care provider. Document Revised: 07/10/2018 Document Reviewed: 05/09/2015 Elsevier Patient Education  2020 Elsevier Inc.  

## 2020-01-27 ENCOUNTER — Encounter: Payer: Self-pay | Admitting: Physician Assistant

## 2020-01-28 ENCOUNTER — Encounter: Payer: Self-pay | Admitting: Skilled Nursing Facility1

## 2020-01-28 ENCOUNTER — Encounter: Payer: BC Managed Care – PPO | Attending: Advanced Practice Midwife | Admitting: Skilled Nursing Facility1

## 2020-01-28 ENCOUNTER — Other Ambulatory Visit: Payer: Self-pay

## 2020-01-28 DIAGNOSIS — Z6841 Body Mass Index (BMI) 40.0 and over, adult: Secondary | ICD-10-CM | POA: Insufficient documentation

## 2020-01-28 LAB — QUANTIFERON-TB GOLD PLUS
QuantiFERON Mitogen Value: >10 IU/mL
QuantiFERON Nil Value: 0.02 IU/mL
QuantiFERON TB1 Ag Value: 0.03 IU/mL
QuantiFERON TB2 Ag Value: 0.01 IU/mL
QuantiFERON-TB Gold Plus: NEGATIVE

## 2020-01-28 NOTE — Progress Notes (Signed)
°  Assessment:  Primary concerns today: weight management.    Allergies: raw apples, raw pears, grape skin, green banana, macadamia.  Pt states all fruits taste sour and all vegetables taste bitter (describing as gross). Pt states all beer tastes the same. Pt states she is also sensitive to texture. Pt state she has only ever grown up eating chicken tenders and potatoes. Pt states she does have severe depression but states it is under control. Pt admits as soon as she is told to try something new she will feel anxious. Pt states he does avoid planning meals because she does not want to eat because she feels she is fat. Pt states she is a creature of habit. Pt states she wants to lose 100 pounds. Pt states she has been on a perpetual diet since she was a child. Pt states she works a double 2-3 days a week. Pt states she has no self control on sweets. Pt states she wants to increase her activity.  Likes crunchy textures but not slimmy. Pt states she has also not tried a lot of vegetables or fruits.  Pt is willing to try new foods.  A1C 6.1   MEDICATIONS: see list   DIETARY INTAKE:  Usual eating pattern includes 3 meals and 2-3 snacks per day.  Everyday foods include none stated.  Avoided foods include non starchy vegetables.    24-hr recall:  B ( AM): 2 protein waffles + honey butter or breakfast burritos  Snk ( AM): pretzels + string cheese L ( PM): roast beef on tortilla and cheese Snk ( PM): pretzels D ( PM): velveeta  Snk ( PM): keto ice cream bar Beverages: iced coffee + sugar free creamer, water, soda   Usual physical activity: ADL's  Estimated energy needs: 1600 calories  Progress Towards Goal(s):  In progress.    Intervention:  Nutrition counsling. Dietitian educated pt on relationship with food and how mental readiness is important for food changes especially within the context of an anxious personality.   Goals: Get a pill for your vitamin D Get nature made for a  multivitamin; take with food on your stomach later in the day so after dinner Try to get up more often using your standing desk at work 3 works days walking 20 minutes  Aim for 2 of your cups of water Aim for a non starchy vegetable 2 days a week: choose from list Use teriyaki or balsamic vinaigrette or pesto to cover your vegetables; cook them enough to be tender but not soggy: try chopped sugar snap peas in noodles and teriyaki  Add onion to your foods   For next visit: relationship with food handouts Introduce more vegetables   Teaching Method Utilized:  Visual Auditory Hands on  Handouts given during visit include:  Detailed MyPlate  Barriers to learning/adherence to lifestyle change: anxiety/depression  Demonstrated degree of understanding via:  Teach Back   Monitoring/Evaluation:  Dietary intake, exercise,and body weight prn.

## 2020-02-03 DIAGNOSIS — R030 Elevated blood-pressure reading, without diagnosis of hypertension: Secondary | ICD-10-CM | POA: Insufficient documentation

## 2020-02-03 DIAGNOSIS — E1165 Type 2 diabetes mellitus with hyperglycemia: Secondary | ICD-10-CM | POA: Insufficient documentation

## 2020-02-03 DIAGNOSIS — E785 Hyperlipidemia, unspecified: Secondary | ICD-10-CM | POA: Insufficient documentation

## 2020-02-03 DIAGNOSIS — R7303 Prediabetes: Secondary | ICD-10-CM | POA: Insufficient documentation

## 2020-02-15 ENCOUNTER — Encounter: Payer: BC Managed Care – PPO | Attending: Advanced Practice Midwife | Admitting: Skilled Nursing Facility1

## 2020-02-15 ENCOUNTER — Other Ambulatory Visit: Payer: Self-pay

## 2020-02-15 NOTE — Progress Notes (Signed)
Assessment:  Primary concerns today: weight management.    Allergies: raw apples, raw pears, grape skin, green banana, macadamia.  (01/28/2020)Pt states all fruits taste sour and all vegetables taste bitter (describing as gross). Pt states all beer tastes the same. Pt states she is also sensitive to texture. Pt state she has only ever grown up eating chicken tenders and potatoes. Pt states she does have severe depression but states it is under control. Pt admits as soon as she is told to try something new she will feel anxious. Pt states he does avoid planning meals because she does not want to eat because she feels she is fat. Pt states she is a creature of habit. Pt states she wants to lose 100 pounds. Pt states she has been on a perpetual diet since she was a child. Pt states she works a double 2-3 days a week. Pt states she has no self control on sweets. Pt states she wants to increase her activity.  Likes crunchy textures but not slimmy. Pt states she has also not tried a lot of vegetables or fruits.  Pt is willing to try new foods.  A1C 6.1   Pt states she did not do good with trying new foods, doing better with water 1.5 bottles (45 total)., got a capsule multivitamin and vitamin D, did not do well the first week after appt with walking 3 times but did do it last week. Pt states she tried black beans and thought it went well. Pt states she wanted to make roast and wanted to put onions in it but her girlfriend told her she would not like it so don't do it. Pt states her girlfriend is open to couples therapy but has not looked up a therapist yet. Pt states she does not have self control over sweets: Pt then stated she did not eat all of the sweets in one day like she would have previously: Dietitian helped her to see she has made changes with self control then.   Body Composition Scale 02/15/2020  Current Body Weight 254.8  Total Body Fat % 43.9  Visceral Fat 13  Fat-Free Mass % 56   Total  Body Water % 42.5  Muscle-Mass lbs 34.1  BMI 41.6  Body Fat Displacement          Torso  lbs 69.4         Left Leg  lbs 13.8         Right Leg  lbs 13.8         Left Arm  lbs 6.9         Right Arm   lbs 6.9     MEDICATIONS: see list   DIETARY INTAKE:  Usual eating pattern includes 3 meals and 2-3 snacks per day.  Everyday foods include none stated.  Avoided foods include non starchy vegetables.    24-hr recall:  B ( AM): 2 protein waffles + honey butter or breakfast burritos   Snk ( AM): pretzels + string cheese L ( PM): roast beef on tortilla and cheese Snk ( PM): pretzels D ( PM): velveeta  Snk ( PM): keto ice cream bar Beverages: iced coffee + sugar free creamer, water, soda   Usual physical activity: Some walking   Estimated energy needs: 1600 calories  Progress Towards Goal(s):  In progress.    Intervention:  Nutrition counsling. Dietitian educated pt on relationship with food and how mental readiness is important for food changes especially within the  context of an anxious personality.  Encouraged patient to reject traditional diet mentality of "good" vs "bad" foods.  There are no good and bad foods, but rather food is fuel that we need for our bodies considering calorically dense verses nutrient dense foods.  When we don't get enough fuel, our bodies suffer the metabolic consequences.  Encouraged patient to eat whatever foods will satisfy them nutritionally. Avoid eating out of an emotional state; eating any food in this state regardless of its nutritional status would be inappropriate.  We will discuss nutritional values of foods at a subsequent appointment.  Encouraged patient to honor their body's internal hunger and fullness cues.  Throughout the day, check in mentally and rate hunger. Stop eating when satisfied not full regardless of how much food is left on the plate.  Get more if still hungry 20-30 minutes later.  The key is to honor satisfaction so throughout the  meal, rate fullness factor and stop when comfortably satisfied not physically full. The key is to honor hunger and fullness without any feelings of guilt or shame.  Pay attention to what the internal cues are, rather than any external factors. This will enhance the confidence you have in listening to your own body and following those internal cues enabling you to increase how often you eat when you are hungry not out of appetite and stop when you are satisfied not full.   Goals: Continue: Try to get up more often using your standing desk at work Continue: 3 works days walking 20 minutes  Awesome getting in your water! Aim for a non starchy vegetable 2 days a week: choose from list Use teriyaki or balsamic vinaigrette or pesto to cover your vegetables; cook them enough to be tender but not soggy: try chopped sugar snap peas in noodles and teriyaki  Add onion to your foods Get a therapist for yourself and a couples therapist  use your should I eat sheet and mindful meals sheet  For next visit: Introduce more vegetables   Teaching Method Utilized:  Visual Auditory Hands on  Handouts given during visit include:  Detailed MyPlate  Barriers to learning/adherence to lifestyle change: anxiety/depression  Demonstrated degree of understanding via:  Teach Back   Monitoring/Evaluation:  Dietary intake, exercise,and body weight prn.

## 2020-03-01 ENCOUNTER — Encounter: Payer: BC Managed Care – PPO | Attending: Advanced Practice Midwife | Admitting: Skilled Nursing Facility1

## 2020-03-01 ENCOUNTER — Other Ambulatory Visit: Payer: Self-pay

## 2020-03-01 NOTE — Progress Notes (Signed)
Assessment:  Primary concerns today: weight management.    Allergies: raw apples, raw pears, grape skin, green banana, macadamia.  Pt states she is working on a to do list to help her remember. Pt was proud of herself for making changes. Pt tried some new foods: [place] foods,  french onion soup, raspberry and blueberry smoothie: none of this interested her. She liked artichoke dip and broccoli florets and sweetpotato. Pt states green paper was okay. Pt states she did a 5k. Pt states she is drinking more water.  Pt states she doesn't like meal planning stating she doesn't want to spend lot of time thinking around food and eating stating she realized how her parents treated her as child making her think it is not healthy to think about food throughout the day. Dietitian educated pt planning ahead for recipe and grocery is healthy. Pt states her outlook for her weight and food has changed positively realizing she wasted her time worring about her weight in her childhood.  Goals Go grocery shopping today. Try chili chicken and red lentil pasta Try avocado. Keep on doing AWESOME changes you have already made( trying vegtables, drinking water, walking more)    (01/28/2020)Pt states all fruits taste sour and all vegetables taste bitter (describing as gross). Pt states all beer tastes the same. Pt states she is also sensitive to texture. Pt state she has only ever grown up eating chicken tenders and potatoes. Pt states she does have severe depression but states it is under control. Pt admits as soon as she is told to try something new she will feel anxious. Pt states he does avoid planning meals because she does not want to eat because she feels she is fat. Pt states she is a creature of habit. Pt states she wants to lose 100 pounds. Pt states she has been on a perpetual diet since she was a child. Pt states she works a double 2-3 days a week. Pt states she has no self control on sweets. Pt states she  wants to increase her activity.  Likes crunchy textures but not slimmy. Pt states she has also not tried a lot of vegetables or fruits.  Pt is willing to try new foods.  A1C 6.1    Body Composition Scale 02/15/2020  Current Body Weight 254.8  Total Body Fat % 43.9  Visceral Fat 13  Fat-Free Mass % 56   Total Body Water % 42.5  Muscle-Mass lbs 34.1  BMI 41.6  Body Fat Displacement          Torso  lbs 69.4         Left Leg  lbs 13.8         Right Leg  lbs 13.8         Left Arm  lbs 6.9         Right Arm   lbs 6.9     MEDICATIONS: see list   DIETARY INTAKE:  Usual eating pattern includes 3 meals and 2-3 snacks per day.  Everyday foods include none stated.  Avoided foods include non starchy vegetables.    24-hr recall:  B ( AM): 2 protein waffles + honey butter or breakfast burritos   Snk ( AM): pretzels + string cheese L ( PM): roast beef on tortilla and cheese Snk ( PM): pretzels D ( PM): velveeta  Snk ( PM): keto ice cream bar Beverages: iced coffee + sugar free creamer, water, soda   Usual physical activity: Some walking  Estimated energy needs: 1600 calories  Progress Towards Goal(s):  In progress.    Intervention:  Nutrition counsling. Dietitian educated pt on relationship with food and how mental readiness is important for food changes especially within the context of an anxious personality.  Encouraged patient to reject traditional diet mentality of "good" vs "bad" foods.  There are no good and bad foods, but rather food is fuel that we need for our bodies considering calorically dense verses nutrient dense foods.  When we don't get enough fuel, our bodies suffer the metabolic consequences.  Encouraged patient to eat whatever foods will satisfy them nutritionally. Avoid eating out of an emotional state; eating any food in this state regardless of its nutritional status would be inappropriate.  We will discuss nutritional values of foods at a subsequent  appointment.  Encouraged patient to honor their body's internal hunger and fullness cues.  Throughout the day, check in mentally and rate hunger. Stop eating when satisfied not full regardless of how much food is left on the plate.  Get more if still hungry 20-30 minutes later.  The key is to honor satisfaction so throughout the meal, rate fullness factor and stop when comfortably satisfied not physically full. The key is to honor hunger and fullness without any feelings of guilt or shame.  Pay attention to what the internal cues are, rather than any external factors. This will enhance the confidence you have in listening to your own body and following those internal cues enabling you to increase how often you eat when you are hungry not out of appetite and stop when you are satisfied not full.   For next visit: Introduce more vegetables   Teaching Method Utilized:  Visual Auditory Hands on  Handouts given during visit include:  Detailed MyPlate  Barriers to learning/adherence to lifestyle change: anxiety/depression  Demonstrated degree of understanding via:  Teach Back   Monitoring/Evaluation:  Dietary intake, exercise,and body weight prn.

## 2020-03-07 NOTE — Progress Notes (Signed)
Established patient visit   Patient: Yolanda Tyler   DOB: 14-Feb-1986   34 y.o. Female  MRN: [id number] Visit Date: 03/08/2020  Today's healthcare provider: Trey Sailors, PA-C   Chief Complaint  Patient presents with  . Follow-up    Pre diabetes  I,Yolanda Tyler,acting as a scribe for Trey Sailors, PA-C.,have documented all relevant documentation on the behalf of Trey Sailors, PA-C,as directed by  Trey Sailors, PA-C while in the presence of Trey Sailors, PA-C.  Subjective    HPI HPI    Follow-up     Additional comments: Pre diabetes       Last edited by Margo Common, CMA on 03/08/2020  8:18 AM. (History)      Pre-diabetes Patient presents today for 6 week pre diabetes follow-up. Patient was started on Metformin at last visit. She reports that her fasting A1C readings is 109-114. She was started on metformin 500 mg and titrated up to one pill BID which she is taking. Has some mild GI distress. Saw nutritionist and is working on incorporating vegetables into her diet.   Lab Results  Component Value Date   HGBA1C 5.8 (A) 03/08/2020   Wt Readings from Last 3 Encounters:  03/08/20 251 lb 6.4 oz (114 kg)  02/15/20 254 lb 12.8 oz (115.6 kg)  01/26/20 262 lb (118.8 kg)   In the interim, she has had a negative CXR and negative TB GOLD. 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 She was even considered for treatment but moved and ultimately did not start this.       Medications: Outpatient Medications Prior to Visit  Medication Sig  . buPROPion (WELLBUTRIN XL) 150 MG 24 hr tablet Take 150 mg by mouth daily.  Marland Kitchen buPROPion (WELLBUTRIN XL) 300 MG 24 hr tablet Take 300 mg by mouth daily. (Patient not taking: Reported on 01/26/2020)  . desvenlafaxine (PRISTIQ) 100 MG 24 hr tablet Take 100 mg by mouth daily.  Marland Kitchen desvenlafaxine (PRISTIQ) 50 MG 24 hr tablet Take 50 mg by mouth daily. (Patient not taking: Reported  on 01/26/2020)  . lamoTRIgine (LAMICTAL) 100 MG tablet Take 100 mg by mouth daily.  . metFORMIN (GLUCOPHAGE XR) 500 MG 24 hr tablet Start with one pill daily for a week. Then can take one pill twice daily onward.  . traZODone (DESYREL) 100 MG tablet Take 100 mg by mouth at bedtime.  . traZODone (DESYREL) 25 mg TABS tablet    No facility-administered medications prior to visit.    Review of Systems  Constitutional: Negative.   Respiratory: Negative.   Cardiovascular: Negative.   Hematological: Negative.       Objective    BP 124/74 (BP Location: Left Arm, Patient Position: Sitting, Cuff Size: Large)   Pulse (!) 143   Temp 98.3 F (36.8 C) (Oral)   Wt 251 lb 6.4 oz (114 kg)   LMP 03/07/2020 (Exact Date)   SpO2 99%   BMI 41.20 kg/m    Physical Exam Constitutional:      Appearance: Normal appearance.  Cardiovascular:     Rate and Rhythm: Normal rate and regular rhythm.     Heart sounds: Normal heart sounds.  Pulmonary:     Effort: Pulmonary effort is normal.     Breath sounds: Normal breath sounds.  Skin:    General: Skin is warm and dry.  Neurological:     General:  No focal deficit present.     Mental Status: She is alert and oriented to person, place, and time. Mental status is at baseline.  Psychiatric:        Mood and Affect: Mood normal.        Behavior: Behavior normal.       No results found for any visits on 03/08/20.  Assessment & Plan    1. Prediabetes  Improving, congratulated on hard work. F/u 6 months.   - POCT glycosylated hemoglobin (Hb A1C)  2. Morbid obesity (HCC)  See above.   3. Elevated BP without diagnosis of hypertension  Normotensive today.  4. Positive TB test  With most recent negative TB test and CXR I am inclined to think previous test was false positive. However, she is anticipating becoming pregnant so will have her see ID to get cleared.   - Ambulatory referral to Infectious Disease   No follow-ups on file.       ITrey Sailors, PA-C, have reviewed all documentation for this visit. The documentation on 03/08/20 for the exam, diagnosis, procedures, and orders are all accurate and complete.  The entirety of the information documented in the History of Present Illness, Review of Systems and Physical Exam were personally obtained by me. Portions of this information were initially documented by [facility] and reviewed by me for thoroughness and accuracy.     Maryella Shivers  [facility] ([facility]) [phone] (phone) [phone] (fax)  [facility]

## 2020-03-08 ENCOUNTER — Other Ambulatory Visit: Payer: Self-pay

## 2020-03-08 ENCOUNTER — Encounter: Payer: Self-pay | Admitting: Physician Assistant

## 2020-03-08 ENCOUNTER — Ambulatory Visit: Payer: BC Managed Care – PPO | Admitting: Physician Assistant

## 2020-03-08 VITALS — BP 124/74 | HR 143 | Temp 98.3°F | Wt 251.4 lb

## 2020-03-08 DIAGNOSIS — R7303 Prediabetes: Secondary | ICD-10-CM

## 2020-03-08 DIAGNOSIS — R7611 Nonspecific reaction to tuberculin skin test without active tuberculosis: Secondary | ICD-10-CM | POA: Diagnosis not present

## 2020-03-08 DIAGNOSIS — R030 Elevated blood-pressure reading, without diagnosis of hypertension: Secondary | ICD-10-CM

## 2020-03-08 LAB — POCT GLYCOSYLATED HEMOGLOBIN (HGB A1C)
Est. average glucose Bld gHb Est-mCnc: 120
Hemoglobin A1C: 5.8 % — AB (ref 4.0–5.6)

## 2020-03-21 ENCOUNTER — Encounter: Payer: Self-pay | Admitting: Internal Medicine

## 2020-03-21 ENCOUNTER — Ambulatory Visit: Payer: BC Managed Care – PPO | Admitting: Internal Medicine

## 2020-03-21 ENCOUNTER — Other Ambulatory Visit: Payer: Self-pay

## 2020-03-21 VITALS — BP 117/83 | HR 94 | Temp 98.0°F | Ht 65.0 in | Wt 253.0 lb

## 2020-03-21 DIAGNOSIS — R899 Unspecified abnormal finding in specimens from other organs, systems and tissues: Secondary | ICD-10-CM

## 2020-03-21 NOTE — Patient Instructions (Signed)
No need for treatment  Repeat testing in future if needed

## 2020-03-21 NOTE — Progress Notes (Signed)
[facility]  Patient Active Problem List   Diagnosis Date Noted  . Prediabetes 02/03/2020  . Hyperlipidemia 02/03/2020  . Elevated BP without diagnosis of hypertension 02/03/2020  . Morbid obesity (HCC) 12/29/2019  . Birth control counseling 05/06/2018  . Well woman exam with routine gynecological exam 05/06/2018      Subjective:    Patient ID: Yolanda Tyler, female    DOB: 1985/08/09, 34 y.o.   MRN: [id number]  Chief Complaint  Patient presents with  . New Patient (Initial Visit)    HPI:  Yolanda Tyler is a 34 y.o. 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 No family members dx'ed with tb No hx ivdu, incarceration, homelessness No travel to outside countries endemic area.   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 Metformin And Related Diarrhea  . Penicillins Hives  . Clindamycin/Lincomycin Rash      Outpatient Medications Prior to Visit  Medication Sig Dispense Refill  . buPROPion (WELLBUTRIN XL) 150 MG 24 hr tablet Take 150 mg by mouth daily.    Marland Kitchen desvenlafaxine (PRISTIQ) 100 MG 24 hr tablet Take 100 mg by mouth daily.    Marland Kitchen lamoTRIgine (LAMICTAL) 100 MG tablet Take 100 mg by mouth daily.    . metFORMIN (GLUCOPHAGE XR) 500 MG 24 hr tablet Start with one pill daily for a week. Then can take one pill twice daily onward. 180 tablet 0  . traZODone (DESYREL) 100 MG tablet Take 100 mg by mouth at bedtime.    Marland Kitchen buPROPion (WELLBUTRIN XL) 300 MG 24 hr tablet Take 300 mg by mouth daily. (Patient not taking:  Reported on 01/26/2020)    . desvenlafaxine (PRISTIQ) 50 MG 24 hr tablet Take 50 mg by mouth daily. (Patient not taking: Reported on 03/21/2020)    . traZODone (DESYREL) 25 mg TABS tablet  (Patient not taking: Reported on 03/21/2020)     No facility-administered medications prior to visit.     Past Medical History:  Diagnosis Date  . Allergy   . Anxiety   . Asthma   . Depression   . History of colposcopy   . Mental disorder    depression and anxiety  . Vaginal Pap smear, abnormal       Past Surgical History:  Procedure Laterality Date  . BUNIONECTOMY    . WISDOM TOOTH EXTRACTION        Family History  Problem Relation Age of Onset  . Graves' disease Mother   . Depression Father   . Bone cancer Maternal Grandfather   . Heart disease Paternal Grandmother       Social History   Socioeconomic History  . Marital status: Media planner    Spouse name: Not on file  . Number of children: Not on file  . Years of education: Not on file  . Highest education level: Not on file  Occupational History  . Not on file  Tobacco Use  . Smoking status: Never Smoker  . Smokeless tobacco: Never Used  Vaping Use  .  Vaping Use: Never used  Substance and Sexual Activity  . Alcohol use: Not Currently  . Drug use: Not Currently  . Sexual activity: Yes    Birth control/protection: None  Other Topics Concern  . Not on file  Social History Narrative  . Not on file   Social Determinants of Health   Financial Resource Strain: Not on file  Food Insecurity: Not on file  Transportation Needs: Not on file  Physical Activity: Not on file  Stress: Not on file  Social Connections: Not on file  Intimate Partner Violence: Not on file      Review of Systems   negative 11 point ros unless mentioned above  Objective:    BP 117/83   Pulse 94   Temp 98 F (36.7 C)   Ht 5\' 5"  (1.651 m)   Wt 253 lb (114.8 kg)   LMP 03/07/2020 (Exact Date)   BMI 42.10 kg/m  Nursing note  and vital signs reviewed.  Physical Exam    well developed, fully conversant, no acute distress Heent: normocephalic; per; conj clear; eomi; oropharynx clear Neck supple cv rrr no mrg Lungs clear; normal respiratory effort abd s/nt Ext no edema Skin no rash msk no back tenderness or peripheral joint swelling/tenderness Neuro cn2-12 intact; strength/reflex/gait normal Psych alert/oriented   Labs: Reviewed  Micro:  Serology:  Imaging: Reviewed  Assessment & Plan:   Patient Active Problem List   Diagnosis Date Noted  . Prediabetes 02/03/2020  . Hyperlipidemia 02/03/2020  . Elevated BP without diagnosis of hypertension 02/03/2020  . Morbid obesity (HCC) 12/29/2019  . Birth control counseling 05/06/2018  . Well woman exam with routine gynecological exam 05/06/2018     Problem List Items Addressed This Visit   None   Visit Diagnoses    Abnormal laboratory test    -  Primary      #history of positive quantiferon gold -test positive 2017 but negative 2021. 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 She is very low risk for active tb development even if exposed. We discussed 20 % of quantiferon gold conversion/reversion  Given negative test recently/low risk and not working in medical field, will not repeat another "tie break test" like t-spot.   If there is concern down the line or requirement to screen again, consider getting t-spot and decide on treatment at that time  Patient agreeable   I am having 2022. Currey maintain her lamoTRIgine, buPROPion, desvenlafaxine, traZODone, desvenlafaxine, traZODone, buPROPion, and metFORMIN.   No orders of the defined types were placed in this encounter.    Follow-up: No follow-ups on file.      Maryanna Shape, MD [facility] -- -- pager   [phone] cell 03/21/2020, 9:27 AM

## 2020-04-03 ENCOUNTER — Ambulatory Visit
Admission: EM | Admit: 2020-04-03 | Discharge: 2020-04-03 | Disposition: A | Discharge status: Home / Self Care | Payer: BC Managed Care – PPO | Attending: Family Medicine | Admitting: Family Medicine

## 2020-04-03 DIAGNOSIS — W540XXA Bitten by dog, initial encounter: Secondary | ICD-10-CM

## 2020-04-03 DIAGNOSIS — S41152A Open bite of left upper arm, initial encounter: Secondary | ICD-10-CM

## 2020-04-03 DIAGNOSIS — Z23 Encounter for immunization: Secondary | ICD-10-CM

## 2020-04-03 MED ORDER — METRONIDAZOLE 500 MG PO TABS
500.0000 mg | ORAL_TABLET | Freq: Two times a day (BID) | ORAL | 0 refills | Status: AC
Start: 2020-04-03 — End: 2020-04-10

## 2020-04-03 MED ORDER — DOXYCYCLINE HYCLATE 100 MG PO CAPS: 100.0000 mg | ORAL_CAPSULE | Freq: Two times a day (BID) | ORAL | 0 refills | Status: DC

## 2020-04-03 MED ORDER — TETANUS-DIPHTH-ACELL PERTUSSIS 5-2.5-18.5 LF-MCG/0.5 IM SUSY
0.5000 mL | PREFILLED_SYRINGE | Freq: Once | INTRAMUSCULAR | Status: AC
  Administered 2020-04-03: 0.5 mL via INTRAMUSCULAR

## 2020-04-03 NOTE — Discharge Instructions (Addendum)
Complete both antibiotics as prescribed. Change dressing twice daily. If signs of infection develop, return for evaluation

## 2020-04-03 NOTE — ED Triage Notes (Signed)
Pt have a dog bite to L arm. 1 puncture wound noted.  Bleeding controlled in triage. Pt sts it was her dog and dog is up to date on vaccines.

## 2020-04-03 NOTE — ED Provider Notes (Signed)
Renaldo Fiddler    CSN: [id number] Arrival date & time: 04/03/20  1126      History   Chief Complaint Chief Complaint  Patient presents with  . Animal Bite    HPI Yolanda Tyler is a 35 y.o. female.   HPI  Patient presents today for evaluation of a left upper extremity dog bite.  Patient no dogs were fighting and she was pulling them apart and one inadvertently bit her.  These are her home pets and all are up-to-date on their vaccines.  Bite injury occurred less than 3 hours ago.  Patient is overdue for tetanus vaccine.  Past Medical History:  Diagnosis Date  . Allergy   . Anxiety   . Asthma   . Depression   . History of colposcopy   . Mental disorder    depression and anxiety  . Vaginal Pap smear, abnormal     Patient Active Problem List   Diagnosis Date Noted  . Prediabetes 02/03/2020  . Hyperlipidemia 02/03/2020  . Elevated BP without diagnosis of hypertension 02/03/2020  . Morbid obesity (HCC) 12/29/2019  . Birth control counseling 05/06/2018  . Well woman exam with routine gynecological exam 05/06/2018    Past Surgical History:  Procedure Laterality Date  . BUNIONECTOMY    . WISDOM TOOTH EXTRACTION      OB History    Gravida  0   Para  0   Term  0   Preterm  0   AB  0   Living  0     SAB  0   IAB  0   Ectopic  0   Multiple  0   Live Births  0            Home Medications    Prior to Admission medications   Medication Sig Start Date End Date Taking? Authorizing Provider  buPROPion (WELLBUTRIN XL) 150 MG 24 hr tablet Take 150 mg by mouth daily. 11/21/19   [provider]  buPROPion (WELLBUTRIN XL) 300 MG 24 hr tablet Take 300 mg by mouth daily. Patient not taking: Reported on 01/26/2020    [provider]  desvenlafaxine (PRISTIQ) 100 MG 24 hr tablet Take 100 mg by mouth daily. 11/21/19   [provider]  desvenlafaxine (PRISTIQ) 50 MG 24 hr tablet Take 50 mg by mouth daily. Patient not taking:  Reported on 03/21/2020    [provider]  lamoTRIgine (LAMICTAL) 100 MG tablet Take 100 mg by mouth daily.    [provider]  metFORMIN (GLUCOPHAGE XR) 500 MG 24 hr tablet Start with one pill daily for a week. Then can take one pill twice daily onward. 01/26/20   Trey Sailors, PA-C  traZODone (DESYREL) 100 MG tablet Take 100 mg by mouth at bedtime.    [provider]  traZODone (DESYREL) 25 mg TABS tablet  06/07/19   [provider]    Family History Family History  Problem Relation Age of Onset  . Graves' disease Mother   . Depression Father   . Bone cancer Maternal Grandfather   . Heart disease Paternal Grandmother     Social History Social History   Tobacco Use  . Smoking status: Never Smoker  . Smokeless tobacco: Never Used  Vaping Use  . Vaping Use: Never used  Substance Use Topics  . Alcohol use: Not Currently  . Drug use: Not Currently     Allergies   Metformin and related, Penicillins, and Clindamycin/lincomycin  Review of Systems Review of Systems Pertinent negatives listed in HPI  Physical Exam Triage Vital Signs ED Triage Vitals  Enc Vitals Group     BP 04/03/20 1215 139/85     Pulse Rate 04/03/20 1215 80     Resp --      Temp 04/03/20 1215 98.2 F (36.8 C)     Temp Source 04/03/20 1215 Oral     SpO2 04/03/20 1215 97 %     Weight --      Height --      Head Circumference --      Peak Flow --      Pain Score 04/03/20 1219 1     Pain Loc --      Pain Edu? --      Excl. in GC? --    No data found.  Updated Vital Signs BP 139/85   Pulse 80   Temp 98.2 F (36.8 C) (Oral)   LMP 03/07/2020 (Exact Date)   SpO2 97%   Visual Acuity Right Eye Distance:   Left Eye Distance:   Bilateral Distance:    Right Eye Near:   Left Eye Near:    Bilateral Near:     Physical Exam HENT:     Head: Normocephalic.  Cardiovascular:     Rate and Rhythm: Normal rate and regular rhythm.  Musculoskeletal:        Arms:  Skin:    Capillary Refill: Capillary refill takes less than 2 seconds.     Findings: Erythema present.  Neurological:     General: No focal deficit present.     Mental Status: She is oriented to person, place, and time.  Psychiatric:        Attention and Perception: Attention and perception normal.      UC Treatments / Results  Labs (all labs ordered are listed, but only abnormal results are displayed) Labs Reviewed - No data to display  EKG   Radiology No results found.  Procedures Procedures (including critical care time)  Medications Ordered in UC Medications  Tdap (BOOSTRIX) injection 0.5 mL (0.5 mLs Intramuscular Given 04/03/20 1253)    Initial Impression / Assessment and Plan / UC Course  I have reviewed the triage vital signs and the nursing notes.  Pertinent labs & imaging results that were available during my care of the patient were reviewed by me and considered in my medical decision making (see chart for details).    Covering with both metronidazole and doxycycline.  Patient has a penicillin allergy.  Tdap vaccine updated.  Wound dressed advised to change dressing twice daily and monitor for signs of infection.  If signs of infection develop return to urgent care immediately.  Pleat entire course of treatment.  Patient verbalized understanding and agreement with plan. Final Clinical Impressions(s) / UC Diagnoses   Final diagnoses:  Dog bite of arm, left, initial encounter  Bite wound of left upper arm, initial encounter  Need for Tdap vaccination     Discharge Instructions     Complete both antibiotics as prescribed. Change dressing twice daily. If signs of infection develop, return for evaluation    ED Prescriptions    Medication Sig Dispense Auth. Provider   doxycycline (VIBRAMYCIN) 100 MG capsule Take 1 capsule (100 mg total) by mouth 2 (two) times daily. 20 capsule Bing Neighbors, FNP   metroNIDAZOLE (FLAGYL) 500 MG tablet Take 1  tablet (500 mg total) by mouth 2 (two) times daily for 7 days.  14 tablet Scot Jun, FNP     PDMP not reviewed this encounter.   Scot Jun, FNP 04/03/20 [phone]

## 2020-04-04 ENCOUNTER — Ambulatory Visit: Payer: BC Managed Care – PPO | Admitting: Skilled Nursing Facility1

## 2020-04-05 ENCOUNTER — Telehealth: Payer: BC Managed Care – PPO | Admitting: Nurse Practitioner

## 2020-04-05 DIAGNOSIS — R059 Cough, unspecified: Secondary | ICD-10-CM

## 2020-04-05 MED ORDER — ALBUTEROL SULFATE HFA 108 (90 BASE) MCG/ACT IN AERS: 2.0000 | INHALATION_SPRAY | Freq: Four times a day (QID) | RESPIRATORY_TRACT | 0 refills | Status: DC | PRN

## 2020-04-05 NOTE — Progress Notes (Signed)
E-Visit for Corona Virus Screening  We are sorry you are not feeling well. We are here to help!  You have tested positive for COVID-19, meaning that you were infected with the novel coronavirus and could give the virus to others.  It is vitally important that you stay home so you do not spread it to others.      Please continue isolation at home, for at least 10 days since the start of your symptoms and until you have had 24 hours with no fever (without taking a fever reducer) and with improving of symptoms.  If you have no symptoms but tested positive (or all symptoms resolve after 5 days and you have no fever) you can leave your house but continue to wear a mask around others for an additional 5 days. If you have a fever,continue to stay home until you have had 24 hours of no fever. Most cases improve 5-10 days from onset but we have seen a small number of patients who have gotten worse after the 10 days.  Please be sure to watch for worsening symptoms and remain taking the proper precautions.   Go to the nearest hospital ED for assessment if fever/cough/breathlessness are severe or illness seems like a threat to life.    The following symptoms may appear 2-14 days after exposure: . Fever . Cough . Shortness of breath or difficulty breathing . Chills . Repeated shaking with chills . Muscle pain . Headache . Sore throat . New loss of taste or smell . Fatigue . Congestion or runny nose . Nausea or vomiting . Diarrhea  You have been enrolled in [facility] Monitoring for COVID-19. Daily you will receive a questionnaire within the MyChart website. Our COVID-19 response team will be monitoring your responses daily.  You can use medication such as A prescription inhaler called Albuterol MDI 90 mcg /actuation 2 puffs every 4 hours as needed for shortness of breath, wheezing, cough  PROVIDERS:  As always, outpatients with mild to moderate symptoms due to COVID-19 should be referred in the  first week after symptom onset by sending an email to: [email or web address].  Please include the patient's name, dob, and best call back number in the e-mail.  For patients referred 8-9 days after symptom onset please type "same day" in the subject line of the email.  If sending the e-mail from a non- account type "SECURE" in the subject line of the e-mail.     You may also take acetaminophen (Tylenol) as needed for fever.  HOME CARE: . Only take medications as instructed by your medical team. . Drink plenty of fluids and get plenty of rest. . A steam or ultrasonic humidifier can help if you have congestion.   GET HELP RIGHT AWAY IF YOU HAVE EMERGENCY WARNING SIGNS.  Call 911 or proceed to your closest emergency facility if: . You develop worsening high fever. . Trouble breathing . Bluish lips or face . Persistent pain or pressure in the chest . New confusion . Inability to wake or stay awake . You cough up blood. . Your symptoms become more severe . Inability to hold down food or fluids  This list is not all possible symptoms. Contact your medical provider for any symptoms that are severe or concerning to you.    Your e-visit answers were reviewed by a board certified advanced clinical practitioner to complete your personal care plan.  Depending on the condition, your plan could have included both over the counter  or prescription medications.  If there is a problem please reply once you have received a response from your provider.  Your safety is important to [place].  If you have drug allergies check your prescription carefully.    You can use MyChart to ask questions about today's visit, request a non-urgent call back, or ask for a work or school excuse for 24 hours related to this e-Visit. If it has been greater than 24 hours you will need to follow up with your provider, or enter a new e-Visit to address those concerns. You will get an e-mail in the next two days  asking about your experience.  I hope that your e-visit has been valuable and will speed your recovery. Thank you for using e-visits.   5-10 minutes spent reviewing and documenting in chart.

## 2020-04-07 ENCOUNTER — Encounter: Payer: Self-pay | Admitting: Physician Assistant

## 2020-04-07 ENCOUNTER — Other Ambulatory Visit: Payer: Self-pay | Admitting: Physician Assistant

## 2020-04-07 DIAGNOSIS — R7303 Prediabetes: Secondary | ICD-10-CM

## 2020-04-12 ENCOUNTER — Other Ambulatory Visit: Payer: Self-pay

## 2020-04-12 ENCOUNTER — Other Ambulatory Visit (HOSPITAL_COMMUNITY)
Admission: RE | Admit: 2020-04-12 | Discharge: 2020-04-12 | Disposition: A | Discharge status: Home / Self Care | Payer: BC Managed Care – PPO | Source: Ambulatory Visit | Attending: Family Medicine | Admitting: Family Medicine

## 2020-04-12 ENCOUNTER — Ambulatory Visit (INDEPENDENT_AMBULATORY_CARE_PROVIDER_SITE_OTHER): Payer: BC Managed Care – PPO

## 2020-04-12 VITALS — BP 148/85 | HR 89

## 2020-04-12 DIAGNOSIS — N898 Other specified noninflammatory disorders of vagina: Secondary | ICD-10-CM | POA: Diagnosis present

## 2020-04-12 DIAGNOSIS — Z113 Encounter for screening for infections with a predominantly sexual mode of transmission: Secondary | ICD-10-CM

## 2020-04-12 NOTE — Progress Notes (Signed)
SUBJECTIVE:  35 y.o. female complains of white vaginal discharge for one day(s) and vaginal itching. Denies abnormal vaginal bleeding or significant pelvic pain or fever. No UTI symptoms. Denies history of known exposure to STD.  No LMP recorded.  OBJECTIVE:  She appears well, afebrile. Urine dipstick: not done.  ASSESSMENT:  Vaginal Discharge Vaginal Itching   PLAN:  GC, chlamydia, trichomonas, BVAG, CVAG probe sent to lab. Treatment: To be determined once lab results are received ROV prn if symptoms persist or worsen.  **Pt request full STI/STD screen**

## 2020-04-12 NOTE — Progress Notes (Signed)
Attestation of Attending Supervision of clinical support staff: I agree with the care provided to this patient and was available for any consultation.  I have reviewed the CMA's note and chart, and I agree with the management and plan.  Burl Tauzin Niles Elizah Lydon, MD, MPH, ABFM Attending Physician [facility]  

## 2020-04-13 ENCOUNTER — Other Ambulatory Visit: Payer: Self-pay

## 2020-04-13 ENCOUNTER — Telehealth: Payer: Self-pay

## 2020-04-13 ENCOUNTER — Other Ambulatory Visit: Payer: Self-pay | Admitting: Physician Assistant

## 2020-04-13 DIAGNOSIS — R7303 Prediabetes: Secondary | ICD-10-CM

## 2020-04-13 LAB — RPR: RPR Ser Ql: NONREACTIVE

## 2020-04-13 LAB — CERVICOVAGINAL ANCILLARY ONLY
Bacterial Vaginitis (gardnerella): NEGATIVE
Candida Glabrata: NEGATIVE
Candida Vaginitis: POSITIVE — AB
Chlamydia: NEGATIVE
Comment: NEGATIVE
Comment: NEGATIVE
Comment: NEGATIVE
Comment: NEGATIVE
Comment: NEGATIVE
Comment: NORMAL
Neisseria Gonorrhea: NEGATIVE
Trichomonas: NEGATIVE

## 2020-04-13 LAB — HEPATITIS C ANTIBODY: Hep C Virus Ab: <0.1 s/co ratio (ref 0.0–0.9)

## 2020-04-13 LAB — HIV ANTIBODY (ROUTINE TESTING W REFLEX): HIV Screen 4th Generation wRfx: NONREACTIVE

## 2020-04-13 LAB — HEPATITIS B SURFACE ANTIGEN: Hepatitis B Surface Ag: NEGATIVE

## 2020-04-13 MED ORDER — FLUCONAZOLE 150 MG PO TABS
150.0000 mg | ORAL_TABLET | Freq: Once | ORAL | 3 refills | Status: DC
Start: 2020-04-13 — End: 2020-04-14

## 2020-04-13 NOTE — Telephone Encounter (Signed)
Pt given results, medication sent to pharmacy. Pt verbalizes understanding.

## 2020-04-13 NOTE — Telephone Encounter (Signed)
-----   Message from Federico Flake, MD sent at 04/13/2020 12:47 PM EST ----- + yeast. Call patient with results and offer yeast treatment if desired. Given itching reported on 1/12 would recommend treatment

## 2020-04-14 ENCOUNTER — Other Ambulatory Visit: Payer: Self-pay | Admitting: *Deleted

## 2020-04-14 MED ORDER — FLUCONAZOLE 150 MG PO TABS: 150.0000 mg | ORAL_TABLET | Freq: Once | ORAL | 3 refills | Status: AC

## 2020-04-14 NOTE — Progress Notes (Signed)
Pt states pharmacy did not receive it, will send it in again.

## 2020-04-26 ENCOUNTER — Encounter: Payer: BC Managed Care – PPO | Attending: Advanced Practice Midwife | Admitting: Skilled Nursing Facility1

## 2020-04-26 ENCOUNTER — Other Ambulatory Visit: Payer: Self-pay

## 2020-04-26 DIAGNOSIS — E669 Obesity, unspecified: Secondary | ICD-10-CM | POA: Diagnosis present

## 2020-04-26 NOTE — Progress Notes (Signed)
Assessment:  Primary concerns today: weight management.    Allergies: raw apples, raw pears, grape skin, green banana, macadamia.  Pt states her outlook for her weight and food has changed positively realizing she wasted her time worring about her weight in her childhood.   Pt states she will be stopping the aderall soon. Pt states after having covid in December she struggled with motivation and then a bite by her dog led to an ED visit. Pt states she has not had the best environment for making changes. Pt state she has plans to try onions and carrots and beef tips and has bought broccoli. Pt state she is concerned with her weight loss of 10 pounds having not eaten sweet snacks. Pt states she went for a 3 mile walk that went well and she is happy with herself. Pt states one of her goals is to cook non starchy vegetable 1 times a week and eat them 3 times a week and 1 outdoor physical activity a week and step goal of 5000 steps. Realized lost 20 pound without adderall. Pt states her blood pressure has gotten better as well as her A1C (6.1 down to 5.8). Pt states she will find a therapist this week.    Goals Work on your goals you created for the new year Keep on doing AWESOME changes you have already made( trying vegtables, drinking water, walking more)    Body Composition Scale 02/15/2020  Current Body Weight 254.8  Total Body Fat % 43.9  Visceral Fat 13  Fat-Free Mass % 56   Total Body Water % 42.5  Muscle-Mass lbs 34.1  BMI 41.6  Body Fat Displacement          Torso  lbs 69.4         Left Leg  lbs 13.8         Right Leg  lbs 13.8         Left Arm  lbs 6.9         Right Arm   lbs 6.9     MEDICATIONS: see list   DIETARY INTAKE:  Usual eating pattern includes 3 meals and 2-3 snacks per day.  Everyday foods include none stated.  Avoided foods include non starchy vegetables.    24-hr recall:  B ( AM): 2 protein waffles + honey butter or breakfast burritos   Snk ( AM):  pretzels + string cheese L ( PM): roast beef on tortilla and cheese Snk ( PM): pretzels D ( PM): velveeta  Snk ( PM): keto ice cream bar Beverages: iced coffee + sugar free creamer, water, soda   Usual physical activity: Some walking   Estimated energy needs: 1600 calories  Progress Towards Goal(s):  In progress.    Intervention:  Nutrition counsling. Dietitian educated pt on relationship with food and how mental readiness is important for food changes especially within the context of an anxious personality.  Encouraged patient to reject traditional diet mentality of "good" vs "bad" foods.  There are no good and bad foods, but rather food is fuel that we need for our bodies considering calorically dense verses nutrient dense foods.  When we don't get enough fuel, our bodies suffer the metabolic consequences.  Encouraged patient to eat whatever foods will satisfy them nutritionally. Avoid eating out of an emotional state; eating any food in this state regardless of its nutritional status would be inappropriate.  We will discuss nutritional values of foods at a subsequent appointment.  Encouraged patient  to honor their body's internal hunger and fullness cues.  Throughout the day, check in mentally and rate hunger. Stop eating when satisfied not full regardless of how much food is left on the plate.  Get more if still hungry 20-30 minutes later.  The key is to honor satisfaction so throughout the meal, rate fullness factor and stop when comfortably satisfied not physically full. The key is to honor hunger and fullness without any feelings of guilt or shame.  Pay attention to what the internal cues are, rather than any external factors. This will enhance the confidence you have in listening to your own body and following those internal cues enabling you to increase how often you eat when you are hungry not out of appetite and stop when you are satisfied not full.   For next visit: Introduce more  vegetables   Teaching Method Utilized:  Visual Auditory Hands on  Handouts given during visit include:  Detailed MyPlate  Barriers to learning/adherence to lifestyle change: anxiety/depression  Demonstrated degree of understanding via:  Teach Back   Monitoring/Evaluation:  Dietary intake, exercise,and body weight prn.

## 2020-04-27 ENCOUNTER — Other Ambulatory Visit: Payer: Self-pay | Admitting: Physician Assistant

## 2020-04-27 DIAGNOSIS — R7303 Prediabetes: Secondary | ICD-10-CM

## 2020-06-21 ENCOUNTER — Ambulatory Visit: Payer: BC Managed Care – PPO | Admitting: Skilled Nursing Facility1

## 2020-06-30 ENCOUNTER — Other Ambulatory Visit: Payer: Self-pay | Admitting: Physician Assistant

## 2020-06-30 DIAGNOSIS — R7303 Prediabetes: Secondary | ICD-10-CM

## 2020-07-01 ENCOUNTER — Other Ambulatory Visit: Payer: Self-pay | Admitting: Nurse Practitioner

## 2020-07-03 MED ORDER — METFORMIN HCL ER 500 MG PO TB24: ORAL_TABLET | ORAL | 0 refills | Status: DC

## 2020-09-06 ENCOUNTER — Ambulatory Visit: Payer: Self-pay | Admitting: Physician Assistant

## 2020-09-18 ENCOUNTER — Ambulatory Visit: Payer: BC Managed Care – PPO | Admitting: Family Medicine

## 2020-09-18 ENCOUNTER — Encounter: Payer: Self-pay | Admitting: Family Medicine

## 2020-09-18 ENCOUNTER — Other Ambulatory Visit: Payer: Self-pay

## 2020-09-18 VITALS — BP 126/81 | HR 93 | Temp 98.4°F | Resp 16 | Ht 65.5 in | Wt 239.9 lb

## 2020-09-18 DIAGNOSIS — E669 Obesity, unspecified: Secondary | ICD-10-CM | POA: Diagnosis not present

## 2020-09-18 DIAGNOSIS — F329 Major depressive disorder, single episode, unspecified: Secondary | ICD-10-CM | POA: Insufficient documentation

## 2020-09-18 DIAGNOSIS — R030 Elevated blood-pressure reading, without diagnosis of hypertension: Secondary | ICD-10-CM | POA: Diagnosis not present

## 2020-09-18 DIAGNOSIS — F419 Anxiety disorder, unspecified: Secondary | ICD-10-CM | POA: Insufficient documentation

## 2020-09-18 DIAGNOSIS — Z6839 Body mass index (BMI) 39.0-39.9, adult: Secondary | ICD-10-CM

## 2020-09-18 DIAGNOSIS — R7303 Prediabetes: Secondary | ICD-10-CM | POA: Diagnosis not present

## 2020-09-18 DIAGNOSIS — E559 Vitamin D deficiency, unspecified: Secondary | ICD-10-CM | POA: Insufficient documentation

## 2020-09-18 DIAGNOSIS — K5904 Chronic idiopathic constipation: Secondary | ICD-10-CM

## 2020-09-18 DIAGNOSIS — F331 Major depressive disorder, recurrent, moderate: Secondary | ICD-10-CM

## 2020-09-18 DIAGNOSIS — E785 Hyperlipidemia, unspecified: Secondary | ICD-10-CM | POA: Diagnosis not present

## 2020-09-18 DIAGNOSIS — F32A Depression, unspecified: Secondary | ICD-10-CM | POA: Insufficient documentation

## 2020-09-18 DIAGNOSIS — W57XXXA Bitten or stung by nonvenomous insect and other nonvenomous arthropods, initial encounter: Secondary | ICD-10-CM | POA: Insufficient documentation

## 2020-09-18 LAB — POCT GLYCOSYLATED HEMOGLOBIN (HGB A1C)
Est. average glucose Bld gHb Est-mCnc: 111
Hemoglobin A1C: 5.5 % (ref 4.0–5.6)

## 2020-09-18 NOTE — Assessment & Plan Note (Signed)
Continue OTC vitamin D supplement Recheck vitamin D level

## 2020-09-18 NOTE — Assessment & Plan Note (Signed)
Followed by psychiatry No changes to medications Starting therapy this week

## 2020-09-18 NOTE — Assessment & Plan Note (Signed)
Reviewed last lipid panel Not currently on a statin Recheck FLP and CMP Discussed diet and exercise  

## 2020-09-18 NOTE — Patient Instructions (Signed)
Prediabetes Eating Plan °Prediabetes is a condition that causes blood sugar (glucose) levels to be higher than normal. This increases the risk for developing type 2 diabetes (type 2 diabetes mellitus). Working with a health care provider or nutrition specialist (dietitian) to make diet and lifestyle changes can help prevent the onset of diabetes. These changes may help you: °Control your blood glucose levels. °Improve your cholesterol levels. °Manage your blood pressure. °What are tips for following this plan? °Reading food labels °Read food labels to check the amount of fat, salt (sodium), and sugar in prepackaged foods. Avoid foods that have: °Saturated fats. °Trans fats. °Added sugars. °Avoid foods that have more than 300 milligrams (mg) of sodium per serving. Limit your sodium intake to less than 2,300 mg each day. °Shopping °Avoid buying pre-made and processed foods. °Avoid buying drinks with added sugar. °Cooking °Cook with olive oil. Do not use butter, lard, or ghee. °Bake, broil, grill, steam, or boil foods. Avoid frying. °Meal planning ° °Work with your dietitian to create an eating plan that is right for you. This may include tracking how many calories you take in each day. Use a food diary, notebook, or mobile application to track what you eat at each meal. °Consider following a Mediterranean diet. This includes: °Eating several servings of fresh fruits and vegetables each day. °Eating fish at least twice a week. °Eating one serving each day of whole grains, beans, nuts, and seeds. °Using olive oil instead of other fats. °Limiting alcohol. °Limiting red meat. °Using nonfat or low-fat dairy products. °Consider following a plant-based diet. This includes dietary choices that focus on eating mostly vegetables and fruit, grains, beans, nuts, and seeds. °If you have high blood pressure, you may need to limit your sodium intake or follow a diet such as the DASH (Dietary Approaches to Stop Hypertension) eating  plan. The DASH diet aims to lower high blood pressure. °Lifestyle °Set weight loss goals with help from your health care team. It is recommended that most people with prediabetes lose 7% of their body weight. °Exercise for at least 30 minutes 5 or more days a week. °Attend a support group or seek support from a mental health counselor. °Take over-the-counter and prescription medicines only as told by your health care provider. °What foods are recommended? °Fruits °Berries. Bananas. Apples. Oranges. Grapes. Papaya. Mango. Pomegranate. Kiwi. Grapefruit. Cherries. °Vegetables °Lettuce. Spinach. Peas. Beets. Cauliflower. Cabbage. Broccoli. Carrots. Tomatoes. Squash. Eggplant. Herbs. Peppers. Onions. Cucumbers. Brussels sprouts. °Grains °Whole grains, such as whole-wheat or whole-grain breads, crackers, cereals, and pasta. Unsweetened oatmeal. Bulgur. Barley. Quinoa. Brown rice. Corn or whole-wheat flour tortillas or taco shells. °Meats and other proteins °Seafood. Poultry without skin. Lean cuts of pork and beef. Tofu. Eggs. Nuts. Beans. °Dairy °Low-fat or fat-free dairy products, such as yogurt, cottage cheese, and cheese. °Beverages °Water. Tea. Coffee. Sugar-free or diet soda. Seltzer water. Low-fat or nonfat milk. Milk alternatives, such as soy or almond milk. °Fats and oils °Olive oil. Canola oil. Sunflower oil. Grapeseed oil. Avocado. Walnuts. °Sweets and desserts °Sugar-free or low-fat pudding. Sugar-free or low-fat ice cream and other frozen treats. °Seasonings and condiments °Herbs. Sodium-free spices. Mustard. Relish. Low-salt, low-sugar ketchup. Low-salt, low-sugar barbecue sauce. Low-fat or fat-free mayonnaise. °The items listed above may not be a complete list of recommended foods and beverages. Contact a dietitian for more information. °What foods are not recommended? °Fruits °Fruits canned with syrup. °Vegetables °Canned vegetables. Frozen vegetables with butter or cream sauce. °Grains °Refined white  flour and flour   products, such as bread, pasta, snack foods, and cereals. °Meats and other proteins °Fatty cuts of meat. Poultry with skin. Breaded or fried meat. Processed meats. °Dairy °Full-fat yogurt, cheese, or milk. °Beverages °Sweetened drinks, such as iced tea and soda. °Fats and oils °Butter. Lard. Ghee. °Sweets and desserts °Baked goods, such as cake, cupcakes, pastries, cookies, and cheesecake. °Seasonings and condiments °Spice mixes with added salt. Ketchup. Barbecue sauce. Mayonnaise. °The items listed above may not be a complete list of foods and beverages that are not recommended. Contact a dietitian for more information. °Where to find more information °American Diabetes Association: www.diabetes.org °Summary °You may need to make diet and lifestyle changes to help prevent the onset of diabetes. These changes can help you control blood sugar, improve cholesterol levels, and manage blood pressure. °Set weight loss goals with help from your health care team. It is recommended that most people with prediabetes lose 7% of their body weight. °Consider following a Mediterranean diet. This includes eating plenty of fresh fruits and vegetables, whole grains, beans, nuts, seeds, fish, and low-fat dairy, and using olive oil instead of other fats. °This information is not intended to replace advice given to you by your health care provider. Make sure you discuss any questions you have with your health care provider. °Document Revised: 06/17/2019 Document Reviewed: 06/17/2019 °Elsevier Patient Education © 2022 [employer] ° °

## 2020-09-18 NOTE — Assessment & Plan Note (Signed)
Previously improved when started metformin, but now back to her infrequent and hard bowel movements Trial of daily MiraLAX and titrate to 1 soft bowel movement daily

## 2020-09-18 NOTE — Assessment & Plan Note (Signed)
Improved and normal today Continue lifestyle interventions

## 2020-09-18 NOTE — Assessment & Plan Note (Signed)
>>  ASSESSMENT AND PLAN FOR PREDIABETES WRITTEN ON 09/18/2020 10:11 AM BY Erasmo Downer, MD  Improvement in A1c back to normal range today Continue metformin at current dose Discussed low-carb diet and exercise

## 2020-09-18 NOTE — Progress Notes (Signed)
Established patient visit   Patient: Yolanda Tyler   DOB: Sep 21, 1985   35 y.o. Female  MRN: [id number] Visit Date: 09/18/2020  Today's healthcare provider: Shirlee Latch, MD   Chief Complaint  Patient presents with   Hyperglycemia   Hyperlipidemia   Hypertension   Rash    Patient C/O itching, bruising, scarring with mosquito bites.   Subjective    Hyperglycemia Associated symptoms include a rash. Pertinent negatives include no abdominal pain, chest pain, chills, congestion, coughing, fatigue, fever, headaches, nausea, sore throat or vomiting.  Hyperlipidemia Pertinent negatives include no chest pain or shortness of breath.  Hypertension Pertinent negatives include no chest pain, headaches, palpitations or shortness of breath.  HPI     Rash    Additional comments: Patient C/O itching, bruising, scarring with mosquito bites.      Last edited by Myles Lipps, CMA on 09/18/2020  8:26 AM.      Prediabetes, Follow-up  Lab Results  Component Value Date   HGBA1C 5.5 09/18/2020   HGBA1C 5.8 (A) 03/08/2020   HGBA1C 6.1 (H) 12/29/2019   GLUCOSE 107 (H) 12/29/2019    Last seen for this6 months ago.  Management since that visit includes no changes. Current symptoms include none and have been stable.  Prior visit with dietician: no Current diet: in general, a "healthy" diet   Current exercise: walking   Allergic Reaction She states that she has an allergic reaction to mosquitos. When she is bitten, she experiences a painful rash.  Try daily OTC antihistamine and OTC topical steroids locally for reactions   Pertinent Labs:    Component Value Date/Time   CHOL 205 (H) 12/29/2019 0938   TRIG 100 12/29/2019 0938   CHOLHDL 3.5 12/29/2019 0938   CREATININE 0.78 12/29/2019 0938    Wt Readings from Last 3 Encounters:  09/18/20 239 lb 14.4 oz (108.8 kg)  03/21/20 253 lb (114.8 kg)  03/08/20 251 lb 6.4 oz (114 kg)     ----------------------------------------------------------------------------------------- Lipid/Cholesterol, Follow-up  Last lipid panel Other pertinent labs  Lab Results  Component Value Date   CHOL 205 (H) 12/29/2019   HDL 58 12/29/2019   LDLCALC 129 (H) 12/29/2019   TRIG 100 12/29/2019   CHOLHDL 3.5 12/29/2019   Lab Results  Component Value Date   ALT 28 12/29/2019   AST 17 12/29/2019   PLT 339 12/29/2019   TSH 1.370 12/29/2019     She was last seen for this 6 months ago.  Management since that visit includes no changes.  She reports  not on oral medication  Symptoms: No chest pain No chest pressure/discomfort  No dyspnea No lower extremity edema  No numbness or tingling of extremity No orthopnea  No palpitations No paroxysmal nocturnal dyspnea  No speech difficulty No syncope   Current diet: in general, an "unhealthy" diet Current exercise: walking  The ASCVD Risk score Denman George DC Jr., et al., 2013) failed to calculate for the following reasons:   The 2013 ASCVD risk score is only valid for ages 3 to 37  --------------------------------------------------------------------------------------------------- Follow up for elevated blood pressure  The patient was last seen for this 6 months ago. Changes made at last visit include no changes.  She feels that condition is Unchanged.  -----------------------------------------------------------------------------------------  Patient Active Problem List   Diagnosis Date Noted   MDD (major depressive disorder) 09/18/2020   Chronic idiopathic constipation 09/18/2020   Avitaminosis D 09/18/2020   Mosquito bite 09/18/2020   Prediabetes  02/03/2020   Hyperlipidemia 02/03/2020   Elevated BP without diagnosis of hypertension 02/03/2020   Morbid obesity (HCC) 12/29/2019   Birth control counseling 05/06/2018   Social History   Tobacco Use   Smoking status: Never   Smokeless tobacco: Never  Vaping Use   Vaping Use:  Never used  Substance Use Topics   Alcohol use: Not Currently   Drug use: Not Currently   Allergies  Allergen Reactions   Metformin And Related Diarrhea   Penicillins Hives   Clindamycin/Lincomycin Rash       Medications: Outpatient Medications Prior to Visit  Medication Sig   albuterol (VENTOLIN HFA) 108 (90 Base) MCG/ACT inhaler Inhale 2 puffs into the lungs every 6 (six) hours as needed for wheezing or shortness of breath.   buPROPion (WELLBUTRIN XL) 300 MG 24 hr tablet Take 300 mg by mouth daily.   desvenlafaxine (PRISTIQ) 100 MG 24 hr tablet Take 100 mg by mouth daily.   desvenlafaxine (PRISTIQ) 50 MG 24 hr tablet Take 50 mg by mouth daily.   hydrocortisone 2.5 % lotion Apply topically.   ketoconazole (NIZORAL) 2 % shampoo Apply topically daily.   lamoTRIgine (LAMICTAL) 100 MG tablet Take 100 mg by mouth daily.   metFORMIN (GLUCOPHAGE-XR) 500 MG 24 hr tablet One twice daily   traZODone (DESYREL) 100 MG tablet Take 100 mg by mouth at bedtime.   [DISCONTINUED] buPROPion (WELLBUTRIN XL) 150 MG 24 hr tablet Take 150 mg by mouth daily.   [DISCONTINUED] buPROPion (WELLBUTRIN XL) 300 MG 24 hr tablet Take 300 mg by mouth daily. (Patient not taking: Reported on 01/26/2020)   [DISCONTINUED] desvenlafaxine (PRISTIQ) 50 MG 24 hr tablet Take 50 mg by mouth daily. (Patient not taking: Reported on 03/21/2020)   [DISCONTINUED] doxycycline (VIBRAMYCIN) 100 MG capsule Take 1 capsule (100 mg total) by mouth 2 (two) times daily.   [DISCONTINUED] traZODone (DESYREL) 25 mg TABS tablet  (Patient not taking: Reported on 03/21/2020)   No facility-administered medications prior to visit.    Review of Systems  Constitutional:  Negative for activity change, appetite change, chills, fatigue and fever.  HENT:  Negative for congestion, ear pain, rhinorrhea, sinus pain and sore throat.   Eyes:  Negative for visual disturbance.  Respiratory:  Negative for cough, chest tightness, shortness of breath and  wheezing.   Cardiovascular:  Negative for chest pain, palpitations and leg swelling.  Gastrointestinal:  Negative for abdominal pain, blood in stool, diarrhea, nausea and vomiting.  Genitourinary:  Negative for dysuria, flank pain, frequency and urgency.  Skin:  Positive for rash.  Neurological:  Negative for dizziness and headaches.       Objective    BP 126/81 (BP Location: Right Arm, Patient Position: Sitting, Cuff Size: Large)   Pulse 93   Temp 98.4 F (36.9 C) (Oral)   Resp 16   Ht 5' 5.5" (1.664 m)   Wt 239 lb 14.4 oz (108.8 kg)   LMP 09/01/2020 (Exact Date)   SpO2 99%   BMI 39.31 kg/m  BP Readings from Last 3 Encounters:  09/18/20 126/81  04/12/20 (!) 148/85  04/03/20 139/85   Wt Readings from Last 3 Encounters:  09/18/20 239 lb 14.4 oz (108.8 kg)  03/21/20 253 lb (114.8 kg)  03/08/20 251 lb 6.4 oz (114 kg)    Physical Exam Vitals reviewed.  Constitutional:      General: She is not in acute distress.    Appearance: Normal appearance. She is well-developed. She is not diaphoretic.  HENT:  Head: Normocephalic and atraumatic.  Eyes:     General: No scleral icterus.    Conjunctiva/sclera: Conjunctivae normal.  Neck:     Thyroid: No thyromegaly.  Cardiovascular:     Rate and Rhythm: Normal rate and regular rhythm.     Pulses: Normal pulses.     Heart sounds: Normal heart sounds. No murmur heard. Pulmonary:     Effort: Pulmonary effort is normal. No respiratory distress.     Breath sounds: Normal breath sounds. No wheezing, rhonchi or rales.  Musculoskeletal:     Cervical back: Neck supple.     Right lower leg: No edema.     Left lower leg: No edema.  Lymphadenopathy:     Cervical: No cervical adenopathy.  Skin:    General: Skin is warm and dry.     Findings: No rash.  Neurological:     Mental Status: She is alert and oriented to person, place, and time. Mental status is at baseline.  Psychiatric:        Mood and Affect: Mood normal.         Behavior: Behavior normal.      Results for orders placed or performed in visit on 09/18/20  POCT glycosylated hemoglobin (Hb A1C)  Result Value Ref Range   Hemoglobin A1C 5.5 4.0 - 5.6 %   Est. average glucose Bld gHb Est-mCnc 111     Assessment & Plan     Problem List Items Addressed This Visit       Digestive   Chronic idiopathic constipation    Previously improved when started metformin, but now back to her infrequent and hard bowel movements Trial of daily MiraLAX and titrate to 1 soft bowel movement daily         Musculoskeletal and Integument   Mosquito bite    Patient with local allergic reactions to mosquito bites Can use daily OTC antihistamine and topical corticosteroid as needed         Other   Morbid obesity (HCC)    Discussed importance of healthy weight management Discussed diet and exercise        Prediabetes - Primary    Improvement in A1c back to normal range today Continue metformin at current dose Discussed low-carb diet and exercise       Relevant Orders   POCT glycosylated hemoglobin (Hb A1C) (Completed)   Hyperlipidemia    Reviewed last lipid panel Not currently on a statin Recheck FLP and CMP Discussed diet and exercise        Relevant Orders   Comprehensive metabolic panel   Lipid panel   Elevated BP without diagnosis of hypertension    Improved and normal today Continue lifestyle interventions       MDD (major depressive disorder)    Followed by psychiatry No changes to medications Starting therapy this week       Relevant Medications   buPROPion (WELLBUTRIN XL) 300 MG 24 hr tablet   desvenlafaxine (PRISTIQ) 50 MG 24 hr tablet   Avitaminosis D    Continue OTC vitamin D supplement Recheck vitamin D level       Relevant Orders   VITAMIN D 25 Hydroxy (Vit-D Deficiency, Fractures)     Return in about 6 months (around 03/20/2021) for CPE.       Danelle Earthly [place],acting as a Neurosurgeon for Shirlee Latch,  MD.,have documented all relevant documentation on the behalf of Shirlee Latch, MD,as directed by  Shirlee Latch, MD while in the presence of  Shirlee LatchAngela Alisson Rozell, MD.   I, Shirlee LatchAngela Darlyne Schmiesing, MD, have reviewed all documentation for this visit. The documentation on 09/18/20 for the exam, diagnosis, procedures, and orders are all accurate and complete.   Tian Mcmurtrey, Marzella SchleinAngela M, MD, MPH [facility]

## 2020-09-18 NOTE — Assessment & Plan Note (Signed)
Improvement in A1c back to normal range today Continue metformin at current dose Discussed low-carb diet and exercise

## 2020-09-18 NOTE — Assessment & Plan Note (Signed)
Discussed importance of healthy weight management Discussed diet and exercise  

## 2020-09-18 NOTE — Assessment & Plan Note (Signed)
Patient with local allergic reactions to mosquito bites Can use daily OTC antihistamine and topical corticosteroid as needed

## 2020-09-19 LAB — LIPID PANEL
Chol/HDL Ratio: 3 ratio (ref 0.0–4.4)
Cholesterol, Total: 215 mg/dL — ABNORMAL HIGH (ref 100–199)
HDL: 71 mg/dL (ref >39)
LDL Chol Calc (NIH): 116 mg/dL — ABNORMAL HIGH (ref 0–99)
Triglycerides: 165 mg/dL — ABNORMAL HIGH (ref 0–149)
VLDL Cholesterol Cal: 28 mg/dL (ref 5–40)

## 2020-09-19 LAB — COMPREHENSIVE METABOLIC PANEL
ALT: 37 IU/L — ABNORMAL HIGH (ref 0–32)
AST: 22 IU/L (ref 0–40)
Albumin/Globulin Ratio: 1.8 (ref 1.2–2.2)
Albumin: 4.2 g/dL (ref 3.8–4.8)
Alkaline Phosphatase: 74 IU/L (ref 44–121)
BUN/Creatinine Ratio: 13 (ref 9–23)
BUN: 11 mg/dL (ref 6–20)
Bilirubin Total: 0.4 mg/dL (ref 0.0–1.2)
CO2: 22 mmol/L (ref 20–29)
Calcium: 9.1 mg/dL (ref 8.7–10.2)
Chloride: 101 mmol/L (ref 96–106)
Creatinine, Ser: 0.82 mg/dL (ref 0.57–1.00)
Globulin, Total: 2.4 g/dL (ref 1.5–4.5)
Glucose: 103 mg/dL — ABNORMAL HIGH (ref 65–99)
Potassium: 4.1 mmol/L (ref 3.5–5.2)
Sodium: 139 mmol/L (ref 134–144)
Total Protein: 6.6 g/dL (ref 6.0–8.5)
eGFR: 97 mL/min/{1.73_m2} (ref >59)

## 2020-09-19 LAB — VITAMIN D 25 HYDROXY (VIT D DEFICIENCY, FRACTURES): Vit D, 25-Hydroxy: 59.3 ng/mL (ref 30.0–100.0)

## 2020-10-13 ENCOUNTER — Other Ambulatory Visit: Payer: Self-pay | Admitting: Family Medicine

## 2020-10-13 DIAGNOSIS — R7303 Prediabetes: Secondary | ICD-10-CM

## 2021-01-16 ENCOUNTER — Telehealth: Payer: Self-pay

## 2021-01-16 NOTE — Telephone Encounter (Signed)
Left patient a message on VM .  Appt is  04/04/21 at 9:20 a.m.

## 2021-01-16 NOTE — Telephone Encounter (Signed)
Copied from CRM [id number]. Topic: Appointment Scheduling - Scheduling Inquiry for Clinic >> Jan 15, 2021  4:04 PM Yolanda Tyler A wrote: Reason for CRM: Patient would like to be seen by [facility]. Suzie Portela for their physical   Please contact further

## 2021-01-28 ENCOUNTER — Other Ambulatory Visit: Payer: Self-pay | Admitting: Family Medicine

## 2021-01-28 DIAGNOSIS — R7303 Prediabetes: Secondary | ICD-10-CM

## 2021-01-28 NOTE — Telephone Encounter (Signed)
Requested Prescriptions  Pending Prescriptions Disp Refills  . metFORMIN (GLUCOPHAGE-XR) 500 MG 24 hr tablet [Pharmacy Med Name: METFORMIN HCL ER 500 MG TABLET] 180 tablet 0    Sig: TAKE 1 TABLET BY MOUTH TWICE A DAY     Endocrinology:  Diabetes - Biguanides Passed - 01/28/2021  1:09 PM      Passed - Cr in normal range and within 360 days    Creatinine, Ser  Date Value Ref Range Status  09/18/2020 0.82 0.57 - 1.00 mg/dL Final         Passed - HBA1C is between 0 and 7.9 and within 180 days    Hemoglobin A1C  Date Value Ref Range Status  09/18/2020 5.5 4.0 - 5.6 % Final   Hgb A1c MFr Bld  Date Value Ref Range Status  12/29/2019 6.1 (H) 4.8 - 5.6 % Final    Comment:             Prediabetes: 5.7 - 6.4          Diabetes: >6.4          Glycemic control for adults with diabetes: <7.0          Passed - eGFR in normal range and within 360 days    GFR calc Af Amer  Date Value Ref Range Status  12/29/2019 116 >59 mL/min/1.73 Final    Comment:    **[facility] currently reports eGFR in compliance with the current**   recommendations of the [employer]. [facility] will   update reporting as new guidelines are published from the NKF-ASN   Task force.    GFR calc non Af Amer  Date Value Ref Range Status  12/29/2019 100 >59 mL/min/1.73 Final   eGFR  Date Value Ref Range Status  09/18/2020 97 >59 mL/min/1.73 Final         Passed - Valid encounter within last 6 months    Recent Outpatient Visits          4 months ago Prediabetes   [facility] [place], Dionne Bucy, MD   10 months ago Prediabetes   [facility], Wendee Beavers, [place]   1 year ago Prediabetes   [place], Wendee Beavers, [place]      Future Appointments            In 2 months Gwyneth Sprout, [place], [place]

## 2021-02-12 ENCOUNTER — Encounter (HOSPITAL_BASED_OUTPATIENT_CLINIC_OR_DEPARTMENT_OTHER): Payer: Self-pay | Admitting: Emergency Medicine

## 2021-02-12 ENCOUNTER — Emergency Department (HOSPITAL_BASED_OUTPATIENT_CLINIC_OR_DEPARTMENT_OTHER): Payer: BC Managed Care – PPO

## 2021-02-12 ENCOUNTER — Other Ambulatory Visit: Payer: Self-pay

## 2021-02-12 ENCOUNTER — Ambulatory Visit: Payer: Self-pay

## 2021-02-12 ENCOUNTER — Emergency Department (HOSPITAL_BASED_OUTPATIENT_CLINIC_OR_DEPARTMENT_OTHER)
Admission: EM | Admit: 2021-02-12 | Discharge: 2021-02-12 | Disposition: A | Discharge status: Home / Self Care | Payer: BC Managed Care – PPO | Attending: Emergency Medicine | Admitting: Emergency Medicine

## 2021-02-12 DIAGNOSIS — J45909 Unspecified asthma, uncomplicated: Secondary | ICD-10-CM | POA: Diagnosis not present

## 2021-02-12 DIAGNOSIS — W01198A Fall on same level from slipping, tripping and stumbling with subsequent striking against other object, initial encounter: Secondary | ICD-10-CM | POA: Diagnosis not present

## 2021-02-12 DIAGNOSIS — S0990XA Unspecified injury of head, initial encounter: Secondary | ICD-10-CM | POA: Insufficient documentation

## 2021-02-12 DIAGNOSIS — R7303 Prediabetes: Secondary | ICD-10-CM | POA: Insufficient documentation

## 2021-02-12 DIAGNOSIS — Z7984 Long term (current) use of oral hypoglycemic drugs: Secondary | ICD-10-CM | POA: Insufficient documentation

## 2021-02-12 DIAGNOSIS — Z79899 Other long term (current) drug therapy: Secondary | ICD-10-CM | POA: Diagnosis not present
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
## 2021-02-12 NOTE — Telephone Encounter (Signed)
Spoke with patient on the phone and reviewed over triage note below, since patient has suffered a head injury I think it is in her best interest to seek immediate attention in the ED for further imaging and evaluation. There is currently no openings in office, patient agreed to go to ED. KW

## 2021-02-12 NOTE — Discharge Instructions (Signed)
You have been diagnosed with a concussion.  °Ibuprofen or Tylenol for pain °Rest, ice on head.  Stay in a quiet, non-simulating, dark environment. No TV, computer use, video games until headache is resolved completely.  °Follow up with your primary care physician if headache persists.  Return to the emergency department if patient becomes lethargic, begins vomiting or other change in mental status. ° °HEAD INJURY °If any of the following occur notify your physician or go to °the Hospital Emergency Department: ° Increased drowsiness, stupor or loss of consciousness ° Temperature above 100º F ° Vomiting ° Severe headache ° Blood or clear fluid dripping from the nose or ears ° Stiffness of the neck ° Dizziness or blurred vision ° Any other unusual symptoms ° °PRECAUTIONS ° Keep head elevated at all times for the first 24 hours °(Elevate mattress if pillow is ineffective) ° Do not take sedatives, narcotics or alcohol ° Avoid aspirin. Use only acetaminophen (e.g. Tylenol) or °ibuprofen (e.g. Advil) for relief of pain. Follow directions °on the bottle for dosage. ° Use ice packs for comfort ° °

## 2021-02-16 ENCOUNTER — Encounter: Payer: Self-pay | Admitting: Family Medicine

## 2021-04-04 ENCOUNTER — Encounter: Payer: Self-pay | Admitting: Family Medicine

## 2021-04-04 ENCOUNTER — Ambulatory Visit (INDEPENDENT_AMBULATORY_CARE_PROVIDER_SITE_OTHER): Payer: BC Managed Care – PPO | Admitting: Family Medicine

## 2021-04-04 ENCOUNTER — Other Ambulatory Visit: Payer: Self-pay

## 2021-04-04 VITALS — BP 129/87 | HR 83 | Resp 16 | Ht 65.0 in | Wt 247.0 lb

## 2021-04-04 DIAGNOSIS — F331 Major depressive disorder, recurrent, moderate: Secondary | ICD-10-CM

## 2021-04-04 DIAGNOSIS — K5904 Chronic idiopathic constipation: Secondary | ICD-10-CM | POA: Diagnosis not present

## 2021-04-04 DIAGNOSIS — R7303 Prediabetes: Secondary | ICD-10-CM

## 2021-04-04 DIAGNOSIS — R233 Spontaneous ecchymoses: Secondary | ICD-10-CM | POA: Insufficient documentation

## 2021-04-04 DIAGNOSIS — Z0000 Encounter for general adult medical examination without abnormal findings: Secondary | ICD-10-CM | POA: Diagnosis not present

## 2021-04-04 DIAGNOSIS — E785 Hyperlipidemia, unspecified: Secondary | ICD-10-CM

## 2021-04-04 DIAGNOSIS — E559 Vitamin D deficiency, unspecified: Secondary | ICD-10-CM

## 2021-04-04 NOTE — Assessment & Plan Note (Signed)
Repeat lab work given fatigue and weight gain Has been on OTC medication previous vitamin deficient
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
## 2021-04-04 NOTE — Progress Notes (Signed)
Complete physical exam   Patient: Yolanda Tyler   DOB: 25-Aug-1985   36 y.o. Female  MRN: [id number] Visit Date: 04/04/2021  Today's healthcare provider: Jacky Kindle, FNP   Chief Complaint  Patient presents with   Annual Exam   Subjective     HPI  Yolanda Tyler is a 36 y.o. female who presents today for a complete physical exam.  She reports consuming a general diet. Gym/ health club routine includes cardio. She generally feels well. She reports sleeping fairly well. She does not have additional problems to discuss today.  Last Reported  Pap-05/06/2018  Past Medical History:  Diagnosis Date   Allergy    Anxiety    Asthma    Depression    History of colposcopy    Mental disorder    depression and anxiety   Vaginal Pap smear, abnormal    Past Surgical History:  Procedure Laterality Date   BUNIONECTOMY     WISDOM TOOTH EXTRACTION     Social History   Socioeconomic History   Marital status: Media planner    Spouse name: Not on file   Number of children: Not on file   Years of education: Not on file   Highest education level: Not on file  Occupational History   Not on file  Tobacco Use   Smoking status: Never   Smokeless tobacco: Never  Vaping Use   Vaping Use: Never used  Substance and Sexual Activity   Alcohol use: Not Currently   Drug use: Not Currently   Sexual activity: Yes    Birth control/protection: None  Other Topics Concern   Not on file  Social History Narrative   Not on file   Social Determinants of Health   Financial Resource Strain: Not on file  Food Insecurity: Not on file  Transportation Needs: Not on file  Physical Activity: Not on file  Stress: Not on file  Social Connections: Not on file  Intimate Partner Violence: Not on file   Family Status  Relation Name Status   Mother  (Not Specified)   Father  (Not Specified)   MGF  (Not Specified)   PGM  (Not Specified)   Family History  Problem Relation Age of Onset    Graves' disease Mother    Depression Father    Bone cancer Maternal Grandfather    Heart disease Paternal Grandmother    Allergies  Allergen Reactions   Metformin And Related Diarrhea   Penicillins Hives   Clindamycin/Lincomycin Rash    Patient Care Team: Jacky Kindle, FNP as PCP - General (Family Medicine)   Medications: Outpatient Medications Prior to Visit  Medication Sig   albuterol (VENTOLIN HFA) 108 (90 Base) MCG/ACT inhaler Inhale 2 puffs into the lungs every 6 (six) hours as needed for wheezing or shortness of breath.   buPROPion (WELLBUTRIN XL) 150 MG 24 hr tablet Take 150 mg by mouth daily.   desvenlafaxine (PRISTIQ) 100 MG 24 hr tablet Take 100 mg by mouth daily.   desvenlafaxine (PRISTIQ) 50 MG 24 hr tablet Take 50 mg by mouth daily.   hydrocortisone 2.5 % lotion Apply topically.   ketoconazole (NIZORAL) 2 % shampoo Apply topically daily.   lamoTRIgine (LAMICTAL) 100 MG tablet Take 100 mg by mouth daily.   lamoTRIgine (LAMICTAL) 200 MG tablet Take 200 mg by mouth daily.   metFORMIN (GLUCOPHAGE-XR) 500 MG 24 hr tablet TAKE 1 TABLET BY MOUTH TWICE A DAY   methylphenidate (  RITALIN) 20 MG tablet Take 30 mg by mouth daily.   traZODone (DESYREL) 100 MG tablet Take 100 mg by mouth at bedtime.   [DISCONTINUED] buPROPion (WELLBUTRIN XL) 300 MG 24 hr tablet Take 300 mg by mouth daily.   No facility-administered medications prior to visit.    Review of Systems  Constitutional:  Positive for fatigue and unexpected weight change.  Gastrointestinal:  Positive for constipation.  Musculoskeletal:  Positive for neck stiffness.  Hematological:  Bruises/bleeds easily.  Psychiatric/Behavioral:  Positive for decreased concentration.   All other systems reviewed and are negative.    Objective    BP 129/87    Pulse 83    Resp 16    Ht 5\' 5"  (1.651 m)    Wt 247 lb (112 kg)    LMP 03/29/2021 (Exact Date)    SpO2 99%    BMI 41.10 kg/m    Physical Exam Vitals and nursing note  reviewed.  Constitutional:      General: She is awake. She is not in acute distress.    Appearance: Normal appearance. She is well-developed and well-groomed. She is obese. She is not ill-appearing, toxic-appearing or diaphoretic.  HENT:     Head: Normocephalic and atraumatic.     Jaw: There is normal jaw occlusion. No trismus, tenderness, swelling or pain on movement.     Right Ear: Hearing, tympanic membrane, ear canal and external ear normal. There is no impacted cerumen.     Left Ear: Hearing, tympanic membrane, ear canal and external ear normal. There is no impacted cerumen.     Nose: Nose normal. No congestion or rhinorrhea.     Right Turbinates: Not enlarged, swollen or pale.     Left Turbinates: Not enlarged, swollen or pale.     Right Sinus: No maxillary sinus tenderness or frontal sinus tenderness.     Left Sinus: No maxillary sinus tenderness or frontal sinus tenderness.     Mouth/Throat:     Lips: Pink.     Mouth: Mucous membranes are moist. No injury.     Tongue: No lesions.     Pharynx: Oropharynx is clear. Uvula midline. No pharyngeal swelling, oropharyngeal exudate, posterior oropharyngeal erythema or uvula swelling.     Tonsils: No tonsillar exudate or tonsillar abscesses.  Eyes:     General: Lids are normal. Lids are everted, no foreign bodies appreciated. Vision grossly intact. Gaze aligned appropriately. No allergic shiner or visual field deficit.       Right eye: No discharge.        Left eye: No discharge.     Extraocular Movements: Extraocular movements intact.     Conjunctiva/sclera: Conjunctivae normal.     Right eye: Right conjunctiva is not injected. No exudate.    Left eye: Left conjunctiva is not injected. No exudate.    Pupils: Pupils are equal, round, and reactive to light.  Neck:     Thyroid: No thyroid mass, thyromegaly or thyroid tenderness.     Vascular: No carotid bruit.     Trachea: Trachea normal.  Cardiovascular:     Rate and Rhythm: Normal  rate and regular rhythm.     Pulses: Normal pulses.          Carotid pulses are 2+ on the right side and 2+ on the left side.      Radial pulses are 2+ on the right side and 2+ on the left side.       Dorsalis pedis pulses are 2+ on  the right side and 2+ on the left side.       Posterior tibial pulses are 2+ on the right side and 2+ on the left side.     Heart sounds: Normal heart sounds, S1 normal and S2 normal. No murmur heard.   No friction rub. No gallop.  Pulmonary:     Effort: Pulmonary effort is normal. No respiratory distress.     Breath sounds: Normal breath sounds and air entry. No stridor. No wheezing, rhonchi or rales.  Chest:     Chest wall: No tenderness.     Comments: Breasts: breasts appear normal, no suspicious masses, no skin or nipple changes or axillary nodes, symmetric fibrous changes in both upper outer quadrants, right breast normal without mass, skin or nipple changes or axillary nodes, left breast normal without mass, skin or nipple changes or axillary nodes, risk and benefit of breast self-exam was discussed  Abdominal:     General: Abdomen is flat. Bowel sounds are normal. There is no distension.     Palpations: Abdomen is soft. There is no mass.     Tenderness: There is no abdominal tenderness. There is no right CVA tenderness, left CVA tenderness, guarding or rebound.     Hernia: No hernia is present.  Genitourinary:    Comments: Exam deferred; denies complaints Musculoskeletal:        General: No swelling, tenderness, deformity or signs of injury. Normal range of motion.     Cervical back: Full passive range of motion without pain, normal range of motion and neck supple. No edema, rigidity or tenderness. No muscular tenderness.     Right lower leg: No edema.     Left lower leg: No edema.  Lymphadenopathy:     Cervical: No cervical adenopathy.     Right cervical: No superficial, deep or posterior cervical adenopathy.    Left cervical: No superficial, deep or  posterior cervical adenopathy.  Skin:    General: Skin is warm and dry.     Capillary Refill: Capillary refill takes less than 2 seconds.     Coloration: Skin is not jaundiced or pale.     Findings: No bruising, erythema, lesion or rash.  Neurological:     General: No focal deficit present.     Mental Status: She is alert and oriented to person, place, and time. Mental status is at baseline.     GCS: GCS eye subscore is 4. GCS verbal subscore is 5. GCS motor subscore is 6.     Sensory: Sensation is intact. No sensory deficit.     Motor: Motor function is intact. No weakness.     Coordination: Coordination is intact. Coordination normal.     Gait: Gait is intact. Gait normal.  Psychiatric:        Attention and Perception: Attention and perception normal.        Mood and Affect: Mood and affect normal.        Speech: Speech normal.        Behavior: Behavior normal. Behavior is cooperative.        Thought Content: Thought content normal.        Cognition and Memory: Cognition and memory normal.        Judgment: Judgment normal.      Last depression screening scores PHQ 2/9 Scores 04/04/2021 09/18/2020 03/21/2020  PHQ - 2 Score 6 6 0  PHQ- 9 Score 18 19 -   Last fall risk screening Fall Risk  04/04/2021  Falls in the past year? 1  Number falls in past yr: 0  Injury with Fall? 1  Risk for fall due to : -  Follow up -   Last Audit-C alcohol use screening Alcohol Use Disorder Test (AUDIT) 04/04/2021  1. How often do you have a drink containing alcohol? 1  2. How many drinks containing alcohol do you have on a typical day when you are drinking? 0  3. How often do you have six or more drinks on one occasion? 1  AUDIT-C Score 2  4. How often during the last year have you found that you were not able to stop drinking once you had started? -  5. How often during the last year have you failed to do what was normally expected from you because of drinking? -  6. How often during the last year  have you needed a first drink in the morning to get yourself going after a heavy drinking session? -  7. How often during the last year have you had a feeling of guilt of remorse after drinking? -  8. How often during the last year have you been unable to remember what happened the night before because you had been drinking? -  9. Have you or someone else been injured as a result of your drinking? -  10. Has a relative or friend or a doctor or another health worker been concerned about your drinking or suggested you cut down? -  Alcohol Use Disorder Identification Test Final Score (AUDIT) -   A score of 3 or more in women, and 4 or more in men indicates increased risk for alcohol abuse, EXCEPT if all of the points are from question 1   No results found for any visits on 04/04/21.  Assessment & Plan    Routine Health Maintenance and Physical Exam  Exercise Activities and Dietary recommendations  Goals   None     Immunization History  Administered Date(s) Administered   Influenza-Unspecified 03/02/2019, 03/23/2020   PFIZER(Purple Top)SARS-COV-2 Vaccination 06/05/2019, 06/26/2019, 01/29/2020   Tdap 04/03/2020    Health Maintenance  Topic Date Due   COVID-19 Vaccine (4 - Booster for Pfizer series) 03/25/2020   INFLUENZA VACCINE  10/30/2020   PAP SMEAR-Modifier  05/07/2023   TETANUS/TDAP  04/03/2030   Hepatitis C Screening  Completed   HIV Screening  Completed   Pneumococcal Vaccine 44-66 Years old  Aged Out   HPV VACCINES  Aged Out    Discussed health benefits of physical activity, and encouraged her to engage in regular exercise appropriate for her age and condition.  Problem List Items Addressed This Visit       Digestive   Chronic idiopathic constipation    Reinforced additional of vegetables/added fiber to diet and increase in water; add in purposeful exercise Encourage Miralax to assist        Other   Morbid obesity (HCC)    HLD Depression BMI 41 Discussed  importance of healthy weight management Discussed diet and exercise       Relevant Orders   Comprehensive metabolic panel   Prediabetes    Reports constipation since start of metformin Repeat A1c given increase in weight Plan to make medication changes following lab return       Relevant Orders   Hemoglobin A1c   Hyperlipidemia    Previously elevated LDL Repeated lipid panel  recommend diet low in saturated fat and regular exercise - 30 min at least 5 times per  week       Relevant Orders   Lipid panel   MDD (major depressive disorder)    F/b psych; CBT weekly Continue medication regimen as prescribed       Avitaminosis D    Repeat lab work given fatigue and weight gain Has been on OTC medication previous vitamin deficient       Relevant Orders   25-Hydroxyvitamin D Lcms D2+D3   Annual physical exam - Primary    UTD on dental and eye exam; plans to seek sperm options for pregnancy- has not established appt with current OB Things to do to keep yourself healthy  - Exercise at least 30-45 minutes a day, 3-4 days a week.  - Eat a low-fat diet with lots of fruits and vegetables, up to 7-9 servings per day.  - Seatbelts can save your life. Wear them always.  - Smoke detectors on every level of your home, check batteries every year.  - Eye Doctor - have an eye exam every 1-2 years  - Safe sex - if you may be exposed to STDs, use a condom.  - Alcohol -  If you drink, do it moderately, less than 2 drinks per day.  - Health Care Power of Attorney. Choose someone to speak for you if you are not able.  - Depression is common in our stressful world.If you're feeling down or losing interest in things you normally enjoy, please come in for a visit.  - Violence - If anyone is threatening or hurting you, please call immediately.        Bruises easily    Multiple bruises on lower extremities CBC check      Relevant Orders   CBC with Differential/Platelet     Return in  about 1 year (around 04/04/2022) for annual examination.     Leilani MerlI, Lotta Frankenfield T Kalena Mander, FNP, have reviewed all documentation for this visit. The documentation on 04/04/21 for the exam, diagnosis, procedures, and orders are all accurate and complete.    Jacky KindleElise T Damontay Alred, FNP  [facility] [phone] (phone) [phone] (fax)  [facility]

## 2021-04-04 NOTE — Assessment & Plan Note (Signed)
UTD on dental and eye exam; plans to seek sperm options for pregnancy- has not established appt with current OB Things to do to keep yourself healthy  - Exercise at least 30-45 minutes a day, 3-4 days a week.  - Eat a low-fat diet with lots of fruits and vegetables, up to 7-9 servings per day.  - Seatbelts can save your life. Wear them always.  - Smoke detectors on every level of your home, check batteries every year.  - Eye Doctor - have an eye exam every 1-2 years  - Safe sex - if you may be exposed to STDs, use a condom.  - Alcohol -  If you drink, do it moderately, less than 2 drinks per day.  - Health Care Power of Attorney. Choose someone to speak for you if you are not able.  - Depression is common in our stressful world.If you're feeling down or losing interest in things you normally enjoy, please come in for a visit.  - Violence - If anyone is threatening or hurting you, please call immediately.

## 2021-04-04 NOTE — Assessment & Plan Note (Signed)
HLD Depression BMI 41 Discussed importance of healthy weight management Discussed diet and exercise

## 2021-04-04 NOTE — Assessment & Plan Note (Signed)
F/b psych; CBT weekly Continue medication regimen as prescribed

## 2021-04-04 NOTE — Assessment & Plan Note (Signed)
Previously elevated LDL Repeated lipid panel  recommend diet low in saturated fat and regular exercise - 30 min at least 5 times per week

## 2021-04-04 NOTE — Assessment & Plan Note (Signed)
Reinforced additional of vegetables/added fiber to diet and increase in water; add in purposeful exercise Encourage Miralax to assist

## 2021-04-04 NOTE — Assessment & Plan Note (Signed)
Reports constipation since start of metformin Repeat A1c given increase in weight Plan to make medication changes following lab return

## 2021-04-04 NOTE — Assessment & Plan Note (Signed)
>>  ASSESSMENT AND PLAN FOR PREDIABETES WRITTEN ON 04/04/2021 10:41 AM BY PAYNE, ELISE T, FNP  Reports constipation since start of metformin Repeat A1c given increase in weight Plan to make medication changes following lab return

## 2021-04-06 ENCOUNTER — Encounter: Payer: Self-pay | Admitting: Family Medicine

## 2021-04-07 LAB — COMPREHENSIVE METABOLIC PANEL
ALT: 21 IU/L (ref 0–32)
AST: 20 IU/L (ref 0–40)
Albumin/Globulin Ratio: 1.8 (ref 1.2–2.2)
Albumin: 4.4 g/dL (ref 3.8–4.8)
Alkaline Phosphatase: 71 IU/L (ref 44–121)
BUN/Creatinine Ratio: 13 (ref 9–23)
BUN: 11 mg/dL (ref 6–20)
Bilirubin Total: 0.4 mg/dL (ref 0.0–1.2)
CO2: 25 mmol/L (ref 20–29)
Calcium: 9.2 mg/dL (ref 8.7–10.2)
Chloride: 102 mmol/L (ref 96–106)
Creatinine, Ser: 0.83 mg/dL (ref 0.57–1.00)
Globulin, Total: 2.4 g/dL (ref 1.5–4.5)
Glucose: 98 mg/dL (ref 70–99)
Potassium: 4.5 mmol/L (ref 3.5–5.2)
Sodium: 139 mmol/L (ref 134–144)
Total Protein: 6.8 g/dL (ref 6.0–8.5)
eGFR: 95 mL/min/{1.73_m2} (ref >59)

## 2021-04-07 LAB — LIPID PANEL
Chol/HDL Ratio: 2.9 ratio (ref 0.0–4.4)
Cholesterol, Total: 207 mg/dL — ABNORMAL HIGH (ref 100–199)
HDL: 72 mg/dL (ref >39)
LDL Chol Calc (NIH): 120 mg/dL — ABNORMAL HIGH (ref 0–99)
Triglycerides: 84 mg/dL (ref 0–149)
VLDL Cholesterol Cal: 15 mg/dL (ref 5–40)

## 2021-04-07 LAB — CBC WITH DIFFERENTIAL/PLATELET
Basophils Absolute: 0.1 10*3/uL (ref 0.0–0.2)
Basos: 1 %
EOS (ABSOLUTE): 0.1 10*3/uL (ref 0.0–0.4)
Eos: 2 %
Hematocrit: 36.5 % (ref 34.0–46.6)
Hemoglobin: 12.3 g/dL (ref 11.1–15.9)
Immature Grans (Abs): 0 10*3/uL (ref 0.0–0.1)
Immature Granulocytes: 0 %
Lymphocytes Absolute: 2.1 10*3/uL (ref 0.7–3.1)
Lymphs: 37 %
MCH: 28.5 pg (ref 26.6–33.0)
MCHC: 33.7 g/dL (ref 31.5–35.7)
MCV: 85 fL (ref 79–97)
Monocytes Absolute: 0.5 10*3/uL (ref 0.1–0.9)
Monocytes: 8 %
Neutrophils Absolute: 3 10*3/uL (ref 1.4–7.0)
Neutrophils: 52 %
Platelets: 343 10*3/uL (ref 150–450)
RBC: 4.32 x10E6/uL (ref 3.77–5.28)
RDW: 11.8 % (ref 11.7–15.4)
WBC: 5.8 10*3/uL (ref 3.4–10.8)

## 2021-04-07 LAB — HEMOGLOBIN A1C
Est. average glucose Bld gHb Est-mCnc: 117 mg/dL
Hgb A1c MFr Bld: 5.7 % — ABNORMAL HIGH (ref 4.8–5.6)

## 2021-04-07 LAB — 25-HYDROXY VITAMIN D LCMS D2+D3
25-Hydroxy, Vitamin D-2: <1 ng/mL
25-Hydroxy, Vitamin D-3: 55 ng/mL
25-Hydroxy, Vitamin D: 55 ng/mL

## 2021-04-09 ENCOUNTER — Other Ambulatory Visit: Payer: Self-pay | Admitting: Family Medicine

## 2021-04-10 ENCOUNTER — Other Ambulatory Visit: Payer: Self-pay | Admitting: Family Medicine

## 2021-04-10 MED ORDER — ALBUTEROL SULFATE HFA 108 (90 BASE) MCG/ACT IN AERS: 2.0000 | INHALATION_SPRAY | Freq: Four times a day (QID) | RESPIRATORY_TRACT | 0 refills | Status: DC | PRN

## 2021-04-10 MED ORDER — METFORMIN HCL ER 750 MG PO TB24: 750.0000 mg | ORAL_TABLET | Freq: Every day | ORAL | 0 refills | Status: DC

## 2021-04-19 ENCOUNTER — Other Ambulatory Visit: Payer: Self-pay

## 2021-04-19 ENCOUNTER — Ambulatory Visit
Admission: RE | Admit: 2021-04-19 | Discharge: 2021-04-19 | Disposition: A | Discharge status: Home / Self Care | Payer: BC Managed Care – PPO | Source: Ambulatory Visit | Attending: Student | Admitting: Student

## 2021-04-19 VITALS — BP 137/82 | HR 105 | Temp 98.1°F | Resp 18

## 2021-04-19 DIAGNOSIS — J069 Acute upper respiratory infection, unspecified: Secondary | ICD-10-CM | POA: Diagnosis not present

## 2021-04-19 DIAGNOSIS — J4521 Mild intermittent asthma with (acute) exacerbation: Secondary | ICD-10-CM | POA: Diagnosis not present

## 2021-04-19 MED ORDER — PREDNISONE 10 MG (21) PO TBPK: ORAL_TABLET | Freq: Every day | ORAL | 0 refills | Status: DC

## 2021-04-19 NOTE — ED Provider Notes (Signed)
[name]    CSN: [id number] Arrival date & time: 04/19/21  0854      History   Chief Complaint Chief Complaint  Patient presents with   Cough   Asthma    HPI Yolanda Tyler is a 36 y.o. female presenting with asthma exacerbation related to virus.  Medical history asthma.  Describes symptoms for 2 days, including cough and lungs "crackling" in the morning.  States she is using her albuterol inhaler 2 times a day with 1 to 2 puffs, minimal improvement with this.  Also has attempted Mucinex with some relief.  States temperature is running 99 at home, subjective chills.  Denies nausea, vomiting, diarrhea.  Denies shortness of breath, chest pain, dizziness.  Denies known sick exposures but did attend a conference last weekend.  HPI  Past Medical History:  Diagnosis Date   Allergy    Anxiety    Asthma    Depression    History of colposcopy    Mental disorder    depression and anxiety   Vaginal Pap smear, abnormal     Patient Active Problem List   Diagnosis Date Noted   Annual physical exam 04/04/2021   Bruises easily 04/04/2021   MDD (major depressive disorder) 09/18/2020   Chronic idiopathic constipation 09/18/2020   Avitaminosis D 09/18/2020   Prediabetes 02/03/2020   Hyperlipidemia 02/03/2020   Elevated BP without diagnosis of hypertension 02/03/2020   Morbid obesity ([place]) 12/29/2019   Birth control counseling 05/06/2018    Past Surgical History:  Procedure Laterality Date   BUNIONECTOMY     WISDOM TOOTH EXTRACTION      OB History     Gravida  0   Para  0   Term  0   Preterm  0   AB  0   Living  0      SAB  0   IAB  0   Ectopic  0   Multiple  0   Live Births  0            Home Medications    Prior to Admission medications   Medication Sig Start Date End Date Taking? Authorizing Provider  predniSONE (STERAPRED UNI-PAK 21 TAB) 10 MG (21) TBPK tablet Take by mouth daily. Take 6 tabs by mouth daily  for 2 days, then 5  tabs for 2 days, then 4 tabs for 2 days, then 3 tabs for 2 days, 2 tabs for 2 days, then 1 tab by mouth daily for 2 days 04/19/21  Yes Phillip Heal, Sherlon Handing, PA-C  albuterol (VENTOLIN HFA) 108 (90 Base) MCG/ACT inhaler Inhale 2 puffs into the lungs every 6 (six) hours as needed for wheezing or shortness of breath. 04/10/21   Gwyneth Sprout, FNP  buPROPion (WELLBUTRIN XL) 150 MG 24 hr tablet Take 150 mg by mouth daily. 12/19/20   [provider]  desvenlafaxine (PRISTIQ) 100 MG 24 hr tablet Take 100 mg by mouth daily. 11/21/19   [provider]  desvenlafaxine (PRISTIQ) 50 MG 24 hr tablet Take 50 mg by mouth daily.    [provider]  hydrocortisone 2.5 % lotion Apply topically. 07/01/20   [provider]  ketoconazole (NIZORAL) 2 % shampoo Apply topically daily. 08/06/20   [provider]  lamoTRIgine (LAMICTAL) 100 MG tablet Take 100 mg by mouth daily.    [provider]  lamoTRIgine (LAMICTAL) 200 MG tablet Take 200 mg by mouth daily. 03/20/21   [provider]  metFORMIN (GLUCOPHAGE XR) 750 MG 24 hr tablet Take 1 tablet (750 mg total) by mouth daily with breakfast. 04/10/21   Gwyneth Sprout, FNP  methylphenidate (RITALIN) 20 MG tablet Take 30 mg by mouth daily. 01/28/21   [provider]  traZODone (DESYREL) 100 MG tablet Take 100 mg by mouth at bedtime.    [provider]    Family History Family History  Problem Relation Age of Onset   Graves' disease Mother    Depression Father    Bone cancer Maternal Grandfather    Heart disease Paternal Grandmother     Social History Social History   Tobacco Use   Smoking status: Never   Smokeless tobacco: Never  Vaping Use   Vaping Use: Never used  Substance Use Topics   Alcohol use: Not Currently   Drug use: Not Currently     Allergies   Metformin and related, Penicillins, and Clindamycin/lincomycin   Review of Systems Review of Systems  HENT:  Positive for  congestion.   Respiratory:  Positive for cough and wheezing.   All other systems reviewed and are negative.   Physical Exam Triage Vital Signs ED Triage Vitals  Enc Vitals Group     BP 04/19/21 0905 137/82     Pulse Rate 04/19/21 0905 (!) 105     Resp 04/19/21 0905 18     Temp 04/19/21 0905 98.1 F (36.7 C)     Temp src --      SpO2 04/19/21 0905 96 %     Weight --      Height --      Head Circumference --      Peak Flow --      Pain Score 04/19/21 0904 0     Pain Loc --      Pain Edu? --      Excl. in [place]? --    No data found.  Updated Vital Signs BP 137/82 (BP Location: Left Arm)    Pulse (!) 105    Temp 98.1 F (36.7 C)    Resp 18    LMP 03/29/2021 (Exact Date)    SpO2 96%   Visual Acuity Right Eye Distance:   Left Eye Distance:   Bilateral Distance:    Right Eye Near:   Left Eye Near:    Bilateral Near:     Physical Exam Vitals reviewed.  Constitutional:      General: She is not in acute distress.    Appearance: Normal appearance. She is not ill-appearing.  HENT:     Head: Normocephalic and atraumatic.     Right Ear: Tympanic membrane, ear canal and external ear normal. No tenderness. No middle ear effusion. There is no impacted cerumen. Tympanic membrane is not perforated, erythematous, retracted or bulging.     Left Ear: Tympanic membrane, ear canal and external ear normal. No tenderness.  No middle ear effusion. There is no impacted cerumen. Tympanic membrane is not perforated, erythematous, retracted or bulging.     Nose: Nose normal. No congestion.     Mouth/Throat:     Mouth: Mucous membranes are moist.     Pharynx: Uvula midline. No oropharyngeal exudate or posterior oropharyngeal erythema.  Eyes:     Extraocular Movements: Extraocular movements intact.     Pupils: Pupils are equal, round, and reactive to light.  Cardiovascular:     Rate and Rhythm: Regular rhythm. Tachycardia present.     Heart sounds: Normal heart sounds.  Pulmonary:  Effort:  Pulmonary effort is normal.     Breath sounds: Normal breath sounds. No decreased breath sounds, wheezing, rhonchi or rales.  Abdominal:     Palpations: Abdomen is soft.     Tenderness: There is no abdominal tenderness. There is no guarding or rebound.  Lymphadenopathy:     Cervical: No cervical adenopathy.     Right cervical: No superficial cervical adenopathy.    Left cervical: No superficial cervical adenopathy.  Neurological:     General: No focal deficit present.     Mental Status: She is alert and oriented to person, place, and time.  Psychiatric:        Mood and Affect: Mood normal.        Behavior: Behavior normal.        Thought Content: Thought content normal.        Judgment: Judgment normal.     UC Treatments / Results  Labs (all labs ordered are listed, but only abnormal results are displayed) Labs Reviewed - No data to display  EKG   Radiology No results found.  Procedures Procedures (including critical care time)  Medications Ordered in UC Medications - No data to display  Initial Impression / Assessment and Plan / UC Course  I have reviewed the triage vital signs and the nursing notes.  Pertinent labs & imaging results that were available during my care of the patient were reviewed by me and considered in my medical decision making (see chart for details).      This patient is a very pleasant 36 y.o. year old female presenting with asthma exacerbation related to virus. Borderline tachy at 105, afebrile.   Asthma currently poorly controlled on albuterol inhaler only. Prednisone taper sent.  Declines covid, influenza testing.   ED return precautions discussed. Patient verbalizes understanding and agreement.   Coding Level 4 for acute illness with systemic symptoms, and prescription drug management   Final Clinical Impressions(s) / UC Diagnoses   Final diagnoses:  Viral URI with cough  Mild intermittent asthma with acute exacerbation      Discharge Instructions      -Prednisone taper for cough/bronchitis. I recommend taking this in the morning as it could give you energy.  Limit NSAIDs like ibuprofen and alleve while taking this medication as they can increase your risk of stomach upset and even GI bleeding when in combination with a steroid. You can continue tylenol (acetaminophen) up to 1000mg  3x daily. -Albuterol inhaler as needed for cough, wheezing, shortness of breath, 1 to 2 puffs every 6 hours as needed. -With a virus, you're typically contagious for 5-7 days, or as long as you're having fevers.  -Follow-up if symptoms getting worse instead of better, like shortness of breath, you start coughing up red or brown sputum, etc.      ED Prescriptions     Medication Sig Dispense Auth. Provider   predniSONE (STERAPRED UNI-PAK 21 TAB) 10 MG (21) TBPK tablet Take by mouth daily. Take 6 tabs by mouth daily  for 2 days, then 5 tabs for 2 days, then 4 tabs for 2 days, then 3 tabs for 2 days, 2 tabs for 2 days, then 1 tab by mouth daily for 2 days 42 tablet Hazel Sams, PA-C      PDMP not reviewed this encounter.   Hazel Sams, PA-C 04/19/21 [phone]

## 2021-04-19 NOTE — Discharge Instructions (Addendum)
-  Prednisone taper for cough/bronchitis. I recommend taking this in the morning as it could give you energy.  Limit NSAIDs like ibuprofen and alleve while taking this medication as they can increase your risk of stomach upset and even GI bleeding when in combination with a steroid. You can continue tylenol (acetaminophen) up to 1000mg  3x daily. -Albuterol inhaler as needed for cough, wheezing, shortness of breath, 1 to 2 puffs every 6 hours as needed. -With a virus, you're typically contagious for 5-7 days, or as long as you're having fevers.  -Follow-up if symptoms getting worse instead of better, like shortness of breath, you start coughing up red or brown sputum, etc.

## 2021-04-19 NOTE — ED Triage Notes (Addendum)
Pt here with hx of asthma. Has had a cough and crackling in chest for 2 days. Has taken inhaler with no change. Does have some body aches as well.

## 2021-04-22 ENCOUNTER — Telehealth: Payer: BC Managed Care – PPO | Admitting: Family

## 2021-04-22 DIAGNOSIS — J4520 Mild intermittent asthma, uncomplicated: Secondary | ICD-10-CM | POA: Diagnosis not present

## 2021-04-22 DIAGNOSIS — R051 Acute cough: Secondary | ICD-10-CM

## 2021-04-22 MED ORDER — AZITHROMYCIN 250 MG PO TABS: ORAL_TABLET | ORAL | 0 refills | Status: DC

## 2021-04-22 MED ORDER — BENZONATATE 100 MG PO CAPS: 100.0000 mg | ORAL_CAPSULE | Freq: Three times a day (TID) | ORAL | 0 refills | Status: DC | PRN

## 2021-04-22 NOTE — Progress Notes (Signed)
We are sorry that you are not feeling well.  Here is how we plan to help!  Based on your presentation I believe you most likely have A cough due to bacteria.  When patients have a fever and a productive cough with a change in color or increased sputum production, we are concerned about bacterial bronchitis.  If left untreated it can progress to pneumonia.  If your symptoms do not improve with your treatment plan it is important that you contact your provider.   I have prescribed Azithromyin 250 mg: two tablets now and then one tablet daily for 4 additonal days    In addition you may use A non-prescription cough medication called Robitussin DAC. Take 2 teaspoons every 8 hours or Delsym: take 2 teaspoons every 12 hours., A non-prescription cough medication called Mucinex DM: take 2 tablets every 12 hours., and A prescription cough medication called Tessalon Perles 100mg. You may take 1-2 capsules every 8 hours as needed for your cough.    From your responses in the eVisit questionnaire you describe inflammation in the upper respiratory tract which is causing a significant cough.  This is commonly called Bronchitis and has four common causes:   Allergies Viral Infections Acid Reflux Bacterial Infection Allergies, viruses and acid reflux are treated by controlling symptoms or eliminating the cause. An example might be a cough caused by taking certain blood pressure medications. You stop the cough by changing the medication. Another example might be a cough caused by acid reflux. Controlling the reflux helps control the cough.  USE OF BRONCHODILATOR ("RESCUE") INHALERS: There is a risk from using your bronchodilator too frequently.  The risk is that over-reliance on a medication which only relaxes the muscles surrounding the breathing tubes can reduce the effectiveness of medications prescribed to reduce swelling and congestion of the tubes themselves.  Although you feel brief relief from the  bronchodilator inhaler, your asthma may actually be worsening with the tubes becoming more swollen and filled with mucus.  This can delay other crucial treatments, such as oral steroid medications. If you need to use a bronchodilator inhaler daily, several times per day, you should discuss this with your provider.  There are probably better treatments that could be used to keep your asthma under control.     HOME CARE Only take medications as instructed by your medical team. Complete the entire course of an antibiotic. Drink plenty of fluids and get plenty of rest. Avoid close contacts especially the very young and the elderly Cover your mouth if you cough or cough into your sleeve. Always remember to wash your hands A steam or ultrasonic humidifier can help congestion.   GET HELP RIGHT AWAY IF: You develop worsening fever. You become short of breath You cough up blood. Your symptoms persist after you have completed your treatment plan MAKE SURE YOU  Understand these instructions. Will watch your condition. Will get help right away if you are not doing well or get worse.    Thank you for choosing an e-visit.  Your e-visit answers were reviewed by a board certified advanced clinical practitioner to complete your personal care plan. Depending upon the condition, your plan could have included both over the counter or prescription medications.  Please review your pharmacy choice. Make sure the pharmacy is open so you can pick up prescription now. If there is a problem, you may contact your provider through MyChart messaging and have the prescription routed to another pharmacy.  Your safety is   important to us. If you have drug allergies check your prescription carefully.   For the next 24 hours you can use MyChart to ask questions about today's visit, request a non-urgent call back, or ask for a work or school excuse. You will get an email in the next two days asking about your experience. I  hope that your e-visit has been valuable and will speed your recovery.   Approximately 5 minutes was spent documenting and reviewing patient's chart.   

## 2021-04-25 ENCOUNTER — Other Ambulatory Visit: Payer: Self-pay | Admitting: Family Medicine

## 2021-04-25 DIAGNOSIS — R7303 Prediabetes: Secondary | ICD-10-CM

## 2021-04-25 NOTE — Telephone Encounter (Signed)
Dose inconsistent with current med list, please assess. Requested Prescriptions  Pending Prescriptions Disp Refills   metFORMIN (GLUCOPHAGE-XR) 500 MG 24 hr tablet [Pharmacy Med Name: METFORMIN HCL ER 500 MG TABLET] 180 tablet 0    Sig: TAKE 1 TABLET BY MOUTH TWICE A DAY     Endocrinology:  Diabetes - Biguanides Passed - 04/25/2021  1:34 AM      Passed - Cr in normal range and within 360 days    Creatinine, Ser  Date Value Ref Range Status  04/04/2021 0.83 0.57 - 1.00 mg/dL Final         Passed - HBA1C is between 0 and 7.9 and within 180 days    Hgb A1c MFr Bld  Date Value Ref Range Status  04/04/2021 5.7 (H) 4.8 - 5.6 % Final    Comment:             Prediabetes: 5.7 - 6.4          Diabetes: >6.4          Glycemic control for adults with diabetes: <7.0          Passed - eGFR in normal range and within 360 days    GFR calc Af Amer  Date Value Ref Range Status  12/29/2019 116 >59 mL/min/1.73 Final    Comment:    **[facility] currently reports eGFR in compliance with the current**   recommendations of the [employer]. [facility] will   update reporting as new guidelines are published from the NKF-ASN   Task force.    GFR calc non Af Amer  Date Value Ref Range Status  12/29/2019 100 >59 mL/min/1.73 Final   eGFR  Date Value Ref Range Status  04/04/2021 95 >59 mL/min/1.73 Final         Passed - Valid encounter within last 6 months    Recent Outpatient Visits          3 weeks ago Annual physical exam   [facility] Gwyneth Sprout, FNP   7 months ago Prediabetes   [facility], Dionne Bucy, [place]   1 year ago Prediabetes   [facility] Trinna Post, [place]   1 year ago [place], [place], [place]

## 2021-05-03 ENCOUNTER — Other Ambulatory Visit: Payer: Self-pay | Admitting: Family Medicine

## 2021-05-28 ENCOUNTER — Encounter (HOSPITAL_BASED_OUTPATIENT_CLINIC_OR_DEPARTMENT_OTHER): Payer: Self-pay | Admitting: Obstetrics and Gynecology

## 2021-05-28 ENCOUNTER — Emergency Department (HOSPITAL_BASED_OUTPATIENT_CLINIC_OR_DEPARTMENT_OTHER): Payer: BC Managed Care – PPO

## 2021-05-28 ENCOUNTER — Inpatient Hospital Stay (HOSPITAL_BASED_OUTPATIENT_CLINIC_OR_DEPARTMENT_OTHER)
Admission: EM | Admit: 2021-05-28 | Discharge: 2021-05-31 | DRG: 418 | Disposition: A | Discharge status: Home / Self Care | Payer: BC Managed Care – PPO | Attending: Student | Admitting: Student

## 2021-05-28 ENCOUNTER — Other Ambulatory Visit: Payer: Self-pay

## 2021-05-28 ENCOUNTER — Inpatient Hospital Stay (HOSPITAL_COMMUNITY): Payer: BC Managed Care – PPO

## 2021-05-28 DIAGNOSIS — K851 Biliary acute pancreatitis without necrosis or infection: Secondary | ICD-10-CM | POA: Diagnosis not present

## 2021-05-28 DIAGNOSIS — Z6841 Body Mass Index (BMI) 40.0 and over, adult: Secondary | ICD-10-CM | POA: Diagnosis not present

## 2021-05-28 DIAGNOSIS — Z881 Allergy status to other antibiotic agents status: Secondary | ICD-10-CM

## 2021-05-28 DIAGNOSIS — Z7984 Long term (current) use of oral hypoglycemic drugs: Secondary | ICD-10-CM

## 2021-05-28 DIAGNOSIS — D509 Iron deficiency anemia, unspecified: Secondary | ICD-10-CM | POA: Diagnosis present

## 2021-05-28 DIAGNOSIS — Z79899 Other long term (current) drug therapy: Secondary | ICD-10-CM

## 2021-05-28 DIAGNOSIS — F329 Major depressive disorder, single episode, unspecified: Secondary | ICD-10-CM | POA: Diagnosis present

## 2021-05-28 DIAGNOSIS — F419 Anxiety disorder, unspecified: Secondary | ICD-10-CM | POA: Diagnosis present

## 2021-05-28 DIAGNOSIS — Z880 Allergy status to penicillin: Secondary | ICD-10-CM | POA: Diagnosis not present

## 2021-05-28 DIAGNOSIS — D72829 Elevated white blood cell count, unspecified: Secondary | ICD-10-CM | POA: Diagnosis not present

## 2021-05-28 DIAGNOSIS — R7303 Prediabetes: Secondary | ICD-10-CM | POA: Diagnosis present

## 2021-05-28 DIAGNOSIS — Z818 Family history of other mental and behavioral disorders: Secondary | ICD-10-CM

## 2021-05-28 DIAGNOSIS — R748 Abnormal levels of other serum enzymes: Secondary | ICD-10-CM | POA: Diagnosis not present

## 2021-05-28 DIAGNOSIS — J4520 Mild intermittent asthma, uncomplicated: Secondary | ICD-10-CM | POA: Diagnosis not present

## 2021-05-28 DIAGNOSIS — R7989 Other specified abnormal findings of blood chemistry: Secondary | ICD-10-CM | POA: Diagnosis present

## 2021-05-28 DIAGNOSIS — J45909 Unspecified asthma, uncomplicated: Secondary | ICD-10-CM | POA: Diagnosis present

## 2021-05-28 DIAGNOSIS — Z20822 Contact with and (suspected) exposure to covid-19: Secondary | ICD-10-CM | POA: Diagnosis present

## 2021-05-28 DIAGNOSIS — K5900 Constipation, unspecified: Secondary | ICD-10-CM | POA: Diagnosis present

## 2021-05-28 DIAGNOSIS — K8020 Calculus of gallbladder without cholecystitis without obstruction: Secondary | ICD-10-CM | POA: Diagnosis not present

## 2021-05-28 DIAGNOSIS — D649 Anemia, unspecified: Secondary | ICD-10-CM | POA: Diagnosis not present

## 2021-05-28 DIAGNOSIS — F331 Major depressive disorder, recurrent, moderate: Secondary | ICD-10-CM | POA: Diagnosis not present

## 2021-05-28 DIAGNOSIS — Z888 Allergy status to other drugs, medicaments and biological substances status: Secondary | ICD-10-CM | POA: Diagnosis not present

## 2021-05-28 DIAGNOSIS — F32A Depression, unspecified: Secondary | ICD-10-CM | POA: Diagnosis not present

## 2021-05-28 DIAGNOSIS — E876 Hypokalemia: Secondary | ICD-10-CM | POA: Diagnosis not present

## 2021-05-28 DIAGNOSIS — R1011 Right upper quadrant pain: Secondary | ICD-10-CM

## 2021-05-28 DIAGNOSIS — E1165 Type 2 diabetes mellitus with hyperglycemia: Secondary | ICD-10-CM | POA: Diagnosis present

## 2021-05-28 DIAGNOSIS — R1013 Epigastric pain: Secondary | ICD-10-CM | POA: Diagnosis not present

## 2021-05-28 LAB — PREGNANCY, URINE: Preg Test, Ur: NEGATIVE

## 2021-05-28 LAB — CBC
HCT: 40.4 % (ref 36.0–46.0)
Hemoglobin: 12.8 g/dL (ref 12.0–15.0)
MCH: 27.6 pg (ref 26.0–34.0)
MCHC: 31.7 g/dL (ref 30.0–36.0)
MCV: 87.1 fL (ref 80.0–100.0)
Platelets: 353 10*3/uL (ref 150–400)
RBC: 4.64 MIL/uL (ref 3.87–5.11)
RDW: 13.3 % (ref 11.5–15.5)
WBC: 14 10*3/uL — ABNORMAL HIGH (ref 4.0–10.5)
nRBC: 0 % (ref 0.0–0.2)

## 2021-05-28 LAB — COMPREHENSIVE METABOLIC PANEL
ALT: 520 U/L — ABNORMAL HIGH (ref 0–44)
AST: 599 U/L — ABNORMAL HIGH (ref 15–41)
Albumin: 4.3 g/dL (ref 3.5–5.0)
Alkaline Phosphatase: 85 U/L (ref 38–126)
Anion gap: 11 (ref 5–15)
BUN: 11 mg/dL (ref 6–20)
CO2: 25 mmol/L (ref 22–32)
Calcium: 9.4 mg/dL (ref 8.9–10.3)
Chloride: 100 mmol/L (ref 98–111)
Creatinine, Ser: 0.72 mg/dL (ref 0.44–1.00)
GFR, Estimated: >60 mL/min (ref >60)
Glucose, Bld: 130 mg/dL — ABNORMAL HIGH (ref 70–99)
Potassium: 3.5 mmol/L (ref 3.5–5.1)
Sodium: 136 mmol/L (ref 135–145)
Total Bilirubin: 1.8 mg/dL — ABNORMAL HIGH (ref 0.3–1.2)
Total Protein: 7.2 g/dL (ref 6.5–8.1)

## 2021-05-28 LAB — URINALYSIS, ROUTINE W REFLEX MICROSCOPIC
Glucose, UA: NEGATIVE mg/dL
Hgb urine dipstick: NEGATIVE
Ketones, ur: NEGATIVE mg/dL
Leukocytes,Ua: NEGATIVE
Nitrite: NEGATIVE
Specific Gravity, Urine: 1.02 (ref 1.005–1.030)
pH: 6 (ref 5.0–8.0)

## 2021-05-28 LAB — RESP PANEL BY RT-PCR (FLU A&B, COVID) ARPGX2
Influenza A by PCR: NEGATIVE
Influenza B by PCR: NEGATIVE
SARS Coronavirus 2 by RT PCR: NEGATIVE

## 2021-05-28 LAB — LIPASE, BLOOD: Lipase: >10000 U/L — ABNORMAL HIGH (ref 11–51)

## 2021-05-28 MED ORDER — ONDANSETRON HCL 4 MG/2ML IJ SOLN
4.0000 mg | Freq: Four times a day (QID) | INTRAMUSCULAR | Status: DC | PRN
Start: 2021-05-28 — End: 2021-05-31
  Administered 2021-05-30: 4 mg via INTRAVENOUS
  Filled 2021-05-28: qty 2

## 2021-05-28 MED ORDER — BUPROPION HCL ER (XL) 150 MG PO TB24
150.0000 mg | ORAL_TABLET | Freq: Every day | ORAL | Status: DC
  Administered 2021-05-29 – 2021-05-31 (×2): 150 mg via ORAL
  Filled 2021-05-28 (×2): qty 1

## 2021-05-28 MED ORDER — FENTANYL CITRATE PF 50 MCG/ML IJ SOSY
50.0000 ug | PREFILLED_SYRINGE | INTRAMUSCULAR | Status: DC | PRN
  Administered 2021-05-28 – 2021-05-30 (×2): 100 ug via INTRAVENOUS
  Filled 2021-05-28 (×2): qty 1
  Filled 2021-05-28: qty 2

## 2021-05-28 MED ORDER — FENTANYL CITRATE PF 50 MCG/ML IJ SOSY
50.0000 ug | PREFILLED_SYRINGE | Freq: Once | INTRAMUSCULAR | Status: AC
  Administered 2021-05-28: 50 ug via INTRAVENOUS
  Filled 2021-05-28: qty 1

## 2021-05-28 MED ORDER — CIPROFLOXACIN IN D5W 400 MG/200ML IV SOLN
400.0000 mg | Freq: Once | INTRAVENOUS | Status: AC
  Administered 2021-05-28: 400 mg via INTRAVENOUS
  Filled 2021-05-28: qty 200

## 2021-05-28 MED ORDER — SODIUM CHLORIDE 0.9 % IV BOLUS
1000.0000 mL | Freq: Once | INTRAVENOUS | Status: AC
  Administered 2021-05-28: 1000 mL via INTRAVENOUS

## 2021-05-28 MED ORDER — SODIUM CHLORIDE 0.9 % IV BOLUS
500.0000 mL | Freq: Once | INTRAVENOUS | Status: DC
Start: 2021-05-28 — End: 2021-05-28

## 2021-05-28 MED ORDER — ALBUTEROL SULFATE HFA 108 (90 BASE) MCG/ACT IN AERS: 2.0000 | INHALATION_SPRAY | Freq: Four times a day (QID) | RESPIRATORY_TRACT | Status: DC | PRN

## 2021-05-28 MED ORDER — ONDANSETRON HCL 4 MG/2ML IJ SOLN
4.0000 mg | Freq: Once | INTRAMUSCULAR | Status: AC | PRN
  Administered 2021-05-28: 4 mg via INTRAVENOUS
  Filled 2021-05-28: qty 2

## 2021-05-28 MED ORDER — SODIUM CHLORIDE 0.9 % IV SOLN: INTRAVENOUS | Status: DC | PRN

## 2021-05-28 MED ORDER — METRONIDAZOLE 500 MG/100ML IV SOLN
500.0000 mg | Freq: Two times a day (BID) | INTRAVENOUS | Status: DC
  Administered 2021-05-29 – 2021-05-31 (×5): 500 mg via INTRAVENOUS
  Filled 2021-05-28 (×5): qty 100

## 2021-05-28 MED ORDER — TRAZODONE HCL 50 MG PO TABS
100.0000 mg | ORAL_TABLET | Freq: Every day | ORAL | Status: DC
  Administered 2021-05-28 – 2021-05-30 (×3): 100 mg via ORAL
  Filled 2021-05-28 (×3): qty 2

## 2021-05-28 MED ORDER — SODIUM CHLORIDE 0.9 % IV SOLN: INTRAVENOUS | Status: DC

## 2021-05-28 MED ORDER — DULOXETINE HCL 20 MG PO CPEP
40.0000 mg | ORAL_CAPSULE | Freq: Two times a day (BID) | ORAL | Status: DC
Start: 2021-05-28 — End: 2021-05-31
  Administered 2021-05-28 – 2021-05-31 (×5): 40 mg via ORAL
  Filled 2021-05-28 (×6): qty 2

## 2021-05-28 MED ORDER — ALBUTEROL SULFATE (2.5 MG/3ML) 0.083% IN NEBU
2.5000 mg | INHALATION_SOLUTION | Freq: Four times a day (QID) | RESPIRATORY_TRACT | Status: DC | PRN
Start: 2021-05-28 — End: 2021-05-31

## 2021-05-28 MED ORDER — METRONIDAZOLE 500 MG/100ML IV SOLN
500.0000 mg | Freq: Once | INTRAVENOUS | Status: AC
  Administered 2021-05-28: 500 mg via INTRAVENOUS
  Filled 2021-05-28: qty 100

## 2021-05-28 MED ORDER — FENTANYL CITRATE PF 50 MCG/ML IJ SOSY
100.0000 ug | PREFILLED_SYRINGE | Freq: Once | INTRAMUSCULAR | Status: AC
  Administered 2021-05-28: 100 ug via INTRAVENOUS
  Filled 2021-05-28: qty 2

## 2021-05-28 MED ORDER — CIPROFLOXACIN IN D5W 400 MG/200ML IV SOLN
400.0000 mg | Freq: Two times a day (BID) | INTRAVENOUS | Status: DC
  Administered 2021-05-29 – 2021-05-30 (×3): 400 mg via INTRAVENOUS
  Filled 2021-05-28 (×3): qty 200

## 2021-05-28 MED ORDER — LAMOTRIGINE 100 MG PO TABS
200.0000 mg | ORAL_TABLET | Freq: Every day | ORAL | Status: DC
  Administered 2021-05-29 – 2021-05-31 (×2): 200 mg via ORAL
  Filled 2021-05-28 (×2): qty 2

## 2021-05-28 MED ORDER — IOHEXOL 300 MG/ML  SOLN
100.0000 mL | Freq: Once | INTRAMUSCULAR | Status: AC | PRN
  Administered 2021-05-28: 100 mL via INTRAVENOUS

## 2021-05-28 MED ORDER — GADOBUTROL 1 MMOL/ML IV SOLN
10.0000 mL | Freq: Once | INTRAVENOUS | Status: AC | PRN
  Administered 2021-05-28: 10 mL via INTRAVENOUS

## 2021-05-28 NOTE — ED Provider Notes (Addendum)
[facility] EMERGENCY DEPT Provider Note   CSN: [id number] Arrival date & time: 05/28/21  1310     History  Chief Complaint  Patient presents with   Abdominal Pain    Yolanda Tyler is a 36 y.o. female reports herself is otherwise healthy no daily medication use presented today for abdominal pain.  Patient reports a "bubble" sensation in her epigastrium which traveled to her left lower abdomen before then moving to her right upper abdomen.  Patient denies fall/injury, fever, chills, chest pain, cough/hemoptysis, history of blood clot, exam swarming use, recent surgery/immobilization, extremity swelling/color change, history of cancer, diarrhea, dysuria/hematuria or any additional concerns.  HPI     Home Medications Prior to Admission medications   Medication Sig Start Date End Date Taking? Authorizing Provider  albuterol (VENTOLIN HFA) 108 (90 Base) MCG/ACT inhaler INHALE 2 PUFFS BY MOUTH EVERY 6 HOURS AS NEEDED FOR WHEEZE OR SHORTNESS OF BREATH 05/03/21   Gwyneth Sprout, FNP  azithromycin (ZITHROMAX) 250 MG tablet Take 500 mg once, then 250 mg for four days 04/22/21   Evelina Dun A, FNP  benzonatate (TESSALON PERLES) 100 MG capsule Take 1 capsule (100 mg total) by mouth 3 (three) times daily as needed. 04/22/21   Evelina Dun A, FNP  buPROPion (WELLBUTRIN XL) 150 MG 24 hr tablet Take 150 mg by mouth daily. 12/19/20   [provider]  desvenlafaxine (PRISTIQ) 100 MG 24 hr tablet Take 100 mg by mouth daily. 11/21/19   [provider]  desvenlafaxine (PRISTIQ) 50 MG 24 hr tablet Take 50 mg by mouth daily.    [provider]  hydrocortisone 2.5 % lotion Apply topically. 07/01/20   [provider]  ketoconazole (NIZORAL) 2 % shampoo Apply topically daily. 08/06/20   [provider]  lamoTRIgine (LAMICTAL) 100 MG tablet Take 100 mg by mouth daily.    [provider]  lamoTRIgine (LAMICTAL) 200 MG tablet Take 200 mg by mouth  daily. 03/20/21   [provider]  metFORMIN (GLUCOPHAGE XR) 750 MG 24 hr tablet Take 1 tablet (750 mg total) by mouth daily with breakfast. 04/10/21   Gwyneth Sprout, FNP  methylphenidate (RITALIN) 20 MG tablet Take 30 mg by mouth daily. 01/28/21   [provider]  predniSONE (STERAPRED UNI-PAK 21 TAB) 10 MG (21) TBPK tablet Take by mouth daily. Take 6 tabs by mouth daily  for 2 days, then 5 tabs for 2 days, then 4 tabs for 2 days, then 3 tabs for 2 days, 2 tabs for 2 days, then 1 tab by mouth daily for 2 days 04/19/21   Hazel Sams, PA-C  traZODone (DESYREL) 100 MG tablet Take 100 mg by mouth at bedtime.    [provider]      Allergies    Metformin and related, Penicillins, and Clindamycin/lincomycin    Review of Systems   Review of Systems Ten systems are reviewed and are negative for acute change except as noted in the HPI  Physical Exam Updated Vital Signs BP (!) 151/109 (BP Location: Right Arm)    Pulse 95    Temp 98 F (36.7 C)    Resp 18    Ht 5\' 5"  (1.651 m)    Wt 111.1 kg    LMP 05/14/2021 (Approximate)    SpO2 100%    BMI 40.77 kg/m  Physical Exam Constitutional:      General: She is not in acute distress.    Appearance: Normal appearance. She is  well-developed. She is not ill-appearing or diaphoretic.  HENT:     Head: Normocephalic and atraumatic.  Eyes:     General: Vision grossly intact. Gaze aligned appropriately.     Pupils: Pupils are equal, round, and reactive to light.  Neck:     Trachea: Trachea and phonation normal.  Pulmonary:     Effort: Pulmonary effort is normal. No respiratory distress.  Abdominal:     General: There is no distension.     Palpations: Abdomen is soft.     Tenderness: There is abdominal tenderness in the right upper quadrant. There is no guarding or rebound.  Musculoskeletal:        General: Normal range of motion.     Cervical back: Normal range of motion.  Skin:    General: Skin is warm and dry.   Neurological:     Mental Status: She is alert.     GCS: GCS eye subscore is 4. GCS verbal subscore is 5. GCS motor subscore is 6.     Comments: Speech is clear and goal oriented, follows commands Major Cranial nerves without deficit, no facial droop Moves extremities without ataxia, coordination intact  Psychiatric:        Behavior: Behavior normal.    ED Results / Procedures / Treatments   Labs (all labs ordered are listed, but only abnormal results are displayed) Labs Reviewed  CBC - Abnormal; Notable for the following components:      Result Value   WBC 14.0 (*)    All other components within normal limits  URINALYSIS, ROUTINE W REFLEX MICROSCOPIC - Abnormal; Notable for the following components:   Bilirubin Urine SMALL (*)    Protein, ur TRACE (*)    All other components within normal limits  PREGNANCY, URINE  LIPASE, BLOOD  COMPREHENSIVE METABOLIC PANEL    EKG None  Radiology No results found.  Procedures .Critical Care Performed by: Deliah Boston, PA-C Authorized by: Deliah Boston, PA-C   Critical care provider statement:    Critical care time (minutes):  30   Critical care was time spent personally by me on the following activities:  Development of treatment plan with patient or surrogate, discussions with consultants, evaluation of patient's response to treatment, examination of patient, ordering and review of laboratory studies, ordering and review of radiographic studies, ordering and performing treatments and interventions, pulse oximetry, re-evaluation of patient's condition and review of old charts   Care discussed with: admitting provider     Care discussed with comment:  Gasteroenterologist    Medications Ordered in ED Medications  ondansetron (ZOFRAN) injection 4 mg (4 mg Intravenous Given 05/28/21 1347)    ED Course/ Medical Decision Making/ A&P Clinical Course as of 05/28/21 1518  Mon May 28, 2021  [place] [BM]  1518 Dr. Bonney Roussel  [BM]    Clinical Course User Index [BM] Deliah Boston, PA-C                           Medical Decision Making Amount and/or Complexity of Data Reviewed Independent Historian:     Details: Family member at bedside Labs: ordered. Decision-making details documented in ED Course. Radiology: ordered. Decision-making details documented in ED Course.  Risk Prescription drug management. Decision regarding hospitalization.  36 year old female presented with epigastric and right upper quadrant abdominal pain it appears to move throughout her abdomen onset this morning around 3 AM.  Patient is a tender right upper quadrant  on examination today.  Abdominal pain labs ordered along with RUQ ultrasound. ---- CBC shows leukocytosis of 14.0, no anemia or thrombocytopenia. CMP shows no emergent electrolyte derangement, AKI or gap.  LFTs elevated AST is 599, ALT 528, bilirubin 1.8.  This is suggestive of biliary obstruction. Lipase greater than 10,000 suggestive of distal biliary stone. Pregnancy test negative Urinalysis shows no evidence for infection  RUQ [place] radiologist interpretation IMPRESSION:  1.  Cholelithiasis without evidence of acute cholecystitis.     2. Borderline dilation of the common bile duct. If there is concern  for biliary duct obstruction consider MRI/MRCP.  --- I consulted with on-call gastroenterologist Dr. Alessandra Bevels.  Advises obtaining CT abdomen pelvis with patient is here and then transferring to [facility] for better evaluation and obtaining MRCP.  Patient stated understanding of care plan, she is agreeable.  IV antibiotics Cipro and Flagyl ordered as patient has penicillin allergy. On reassessment patient in NAD, some improvement after fentanyl. -- 3:18 PM: Consulted with hospitalist Dr. Bonney Roussel who accepted patient for admission and is placing bed orders at this time.  At the time of shift change patient is awaiting CT abdomen pelvis then transfer to [facility].  Care handoff  given to Dr. Sherry Ruffing at shift change.  Note: Portions of this report may have been transcribed using voice recognition software. Every effort was made to ensure accuracy; however, inadvertent computerized transcription errors may still be present.         Final Clinical Impression(s) / ED Diagnoses Final diagnoses:  RUQ abdominal pain    Rx / DC Orders ED Discharge Orders     None         Deliah Boston, PA-C 05/28/21 1524    Gari Crown 05/28/21 1533    Teressa Lower, MD 05/28/21 1540

## 2021-05-28 NOTE — Plan of Care (Signed)

## 2021-05-28 NOTE — H&P (Addendum)
History and Physical    Yolanda Tyler FGH:[id number] DOB: September 21, 1985 DOA: 05/28/2021  PCP: Jacky Kindle, FNP   Patient coming from: Home   Chief Complaint: Abdominal pain, N/V   HPI: Yolanda Tyler is a pleasant 36 y.o. female with medical history significant for depression, anxiety, asthma, and BMI 41, now presenting to the emergency department for evaluation of abdominal pain, nausea, and vomiting.  The patient reports that she went to bed in her usual state of health but woke at 3 AM with severe pain in the epigastrium that later began to radiate towards the mid abdomen and left scapula.  She had nausea with nonbloody vomiting this morning but that has eased off.  She denies any fevers or chills.  She rarely drinks alcohol and not to excess.  She had a normal triglyceride level last month.  Denies history of pancreatitis.  [facility] ED Course: Upon arrival to the ED, patient is found to be afebrile and saturating well on room air with stable blood pressure.  Chemistry panel notable for AST 599, ALT 520, total bilirubin 1.8, and lipase > 10,000.  CBC features a leukocytosis to 14,000.  Ultrasound demonstrates cholelithiasis and borderline dilatation of CBD.  No necrosis or fluid collection on CT but peripancreatic edema noted.  Gastroenterology was consulted by the ED physician, patient was given Cipro, Flagyl, fentanyl, Zofran, and IV fluids, and she was transported to [facility] for admission.  Review of Systems:  All other systems reviewed and apart from HPI, are negative.  Past Medical History:  Diagnosis Date   Allergy    Anxiety    Asthma    Depression    History of colposcopy    Mental disorder    depression and anxiety   Vaginal Pap smear, abnormal     Past Surgical History:  Procedure Laterality Date   BUNIONECTOMY     WISDOM TOOTH EXTRACTION      Social History:   reports that she has never smoked. She has never used smokeless tobacco. She reports that she  does not currently use alcohol. She reports that she does not currently use drugs.  Allergies  Allergen Reactions   Metformin And Related Diarrhea   Penicillins Hives   Clindamycin/Lincomycin Rash    Family History  Problem Relation Age of Onset   Graves' disease Mother    Depression Father    Bone cancer Maternal Grandfather    Heart disease Paternal Grandmother      Prior to Admission medications   Medication Sig Start Date End Date Taking? Authorizing Provider  albuterol (VENTOLIN HFA) 108 (90 Base) MCG/ACT inhaler INHALE 2 PUFFS BY MOUTH EVERY 6 HOURS AS NEEDED FOR WHEEZE OR SHORTNESS OF BREATH 05/03/21   Jacky Kindle, FNP  azithromycin (ZITHROMAX) 250 MG tablet Take 500 mg once, then 250 mg for four days 04/22/21   Jannifer Rodney A, FNP  benzonatate (TESSALON PERLES) 100 MG capsule Take 1 capsule (100 mg total) by mouth 3 (three) times daily as needed. 04/22/21   Jannifer Rodney A, FNP  buPROPion (WELLBUTRIN XL) 150 MG 24 hr tablet Take 150 mg by mouth daily. 12/19/20   [provider]  desvenlafaxine (PRISTIQ) 100 MG 24 hr tablet Take 100 mg by mouth daily. 11/21/19   [provider]  desvenlafaxine (PRISTIQ) 50 MG 24 hr tablet Take 50 mg by mouth daily.    [provider]  hydrocortisone 2.5 % lotion Apply topically. 07/01/20   [provider]  ketoconazole (NIZORAL) 2 % shampoo Apply topically daily. 08/06/20   [provider]  lamoTRIgine (LAMICTAL) 100 MG tablet Take 100 mg by mouth daily.    [provider]  lamoTRIgine (LAMICTAL) 200 MG tablet Take 200 mg by mouth daily. 03/20/21   [provider]  metFORMIN (GLUCOPHAGE XR) 750 MG 24 hr tablet Take 1 tablet (750 mg total) by mouth daily with breakfast. 04/10/21   Jacky Kindle, FNP  methylphenidate (RITALIN) 20 MG tablet Take 30 mg by mouth daily. 01/28/21   [provider]  predniSONE (STERAPRED UNI-PAK 21 TAB) 10 MG (21) TBPK tablet Take by mouth daily. Take  6 tabs by mouth daily  for 2 days, then 5 tabs for 2 days, then 4 tabs for 2 days, then 3 tabs for 2 days, 2 tabs for 2 days, then 1 tab by mouth daily for 2 days 04/19/21   Rhys Martini, PA-C  traZODone (DESYREL) 100 MG tablet Take 100 mg by mouth at bedtime.    [provider]    Physical Exam: Vitals:   05/28/21 1319 05/28/21 1345 05/28/21 1510 05/28/21 1830  BP: (!) 151/109 (!) 153/78 140/78 (!) 152/87  Pulse: 95 79 89 95  Resp: 18 14 14 18   Temp: 98 F (36.7 C)   98.1 F (36.7 C)  TempSrc:    Oral  SpO2: 100% 100% 100% 100%  Weight:      Height:        Constitutional: NAD, calm  Eyes: PERTLA, lids and conjunctivae normal ENMT: Mucous membranes are moist. Posterior pharynx clear of any exudate or lesions.   Neck: supple, no masses  Respiratory: no wheezing, no crackles. No accessory muscle use.  Cardiovascular: S1 & S2 heard, regular rate and rhythm. No extremity edema.   Abdomen: No distension, soft, tender in upper abdomen without guarding or rebound pain. Bowel sounds active.  Musculoskeletal: no clubbing / cyanosis. No joint deformity upper and lower extremities.   Skin: no significant rashes, lesions, ulcers. Warm, dry, well-perfused. Neurologic: CN 2-12 grossly intact. Moving all extremities. Alert and oriented.  Psychiatric: Very pleasant. Cooperative.    Labs and Imaging on Admission: I have personally reviewed following labs and imaging studies  CBC: Recent Labs  Lab 05/28/21 1320  WBC 14.0*  HGB 12.8  HCT 40.4  MCV 87.1  PLT 353   Basic Metabolic Panel: Recent Labs  Lab 05/28/21 1320  NA 136  K 3.5  CL 100  CO2 25  GLUCOSE 130*  BUN 11  CREATININE 0.72  CALCIUM 9.4   GFR: Estimated Creatinine Clearance: 122.9 mL/min (by C-G formula based on SCr of 0.72 mg/dL). Liver Function Tests: Recent Labs  Lab 05/28/21 1320  AST 599*  ALT 520*  ALKPHOS 85  BILITOT 1.8*  PROT 7.2  ALBUMIN 4.3   Recent Labs  Lab 05/28/21 1320  LIPASE  >10,000*   No results for input(s): AMMONIA in the last 168 hours. Coagulation Profile: No results for input(s): INR, PROTIME in the last 168 hours. Cardiac Enzymes: No results for input(s): CKTOTAL, CKMB, CKMBINDEX, TROPONINI in the last 168 hours. BNP (last 3 results) No results for input(s): PROBNP in the last 8760 hours. HbA1C: No results for input(s): HGBA1C in the last 72 hours. CBG: No results for input(s): GLUCAP in the last 168 hours. Lipid Profile: No results for input(s): CHOL, HDL, LDLCALC, TRIG, CHOLHDL, LDLDIRECT in the last 72 hours. Thyroid Function Tests: No results for input(s): TSH, T4TOTAL,  FREET4, T3FREE, THYROIDAB in the last 72 hours. Anemia Panel: No results for input(s): VITAMINB12, FOLATE, FERRITIN, TIBC, IRON, RETICCTPCT in the last 72 hours. Urine analysis:    Component Value Date/Time   COLORURINE YELLOW 05/28/2021 1320   APPEARANCEUR CLEAR 05/28/2021 1320   LABSPEC 1.020 05/28/2021 1320   PHURINE 6.0 05/28/2021 1320   GLUCOSEU NEGATIVE 05/28/2021 1320   HGBUR NEGATIVE 05/28/2021 1320   BILIRUBINUR SMALL (A) 05/28/2021 1320   KETONESUR NEGATIVE 05/28/2021 1320   PROTEINUR TRACE (A) 05/28/2021 1320   NITRITE NEGATIVE 05/28/2021 1320   LEUKOCYTESUR NEGATIVE 05/28/2021 1320   Sepsis Labs: @LABRCNTIP (procalcitonin:4,lacticidven:4) ) Recent Results (from the past 240 hour(s))  Resp Panel by RT-PCR (Flu A&B, Covid) Nasopharyngeal Swab     Status: None   Collection Time: 05/28/21  3:19 PM   Specimen: Nasopharyngeal Swab; Nasopharyngeal(NP) swabs in vial transport medium  Result Value Ref Range Status   SARS Coronavirus 2 by RT PCR NEGATIVE NEGATIVE Final    Comment: (NOTE) SARS-CoV-2 target nucleic acids are NOT DETECTED.  The SARS-CoV-2 RNA is generally detectable in upper respiratory specimens during the acute phase of infection. The lowest concentration of SARS-CoV-2 viral copies this assay can detect is 138 copies/mL. A negative result does  not preclude SARS-Cov-2 infection and should not be used as the sole basis for treatment or other patient management decisions. A negative result may occur with  improper specimen collection/handling, submission of specimen other than nasopharyngeal swab, presence of viral mutation(s) within the areas targeted by this assay, and inadequate number of viral copies(<138 copies/mL). A negative result must be combined with clinical observations, patient history, and epidemiological information. The expected result is Negative.  Fact Sheet for Patients:  05/30/21  Fact Sheet for Healthcare Providers:  BloggerCourse.com  This test is no t yet approved or cleared by the [employer] FDA and  has been authorized for detection and/or diagnosis of SARS-CoV-2 by FDA under an Emergency Use Authorization (EUA). This EUA will remain  in effect (meaning this test can be used) for the duration of the COVID-19 declaration under Section 564(b)(1) of the Act, 21 U.S.C.section 360bbb-3(b)(1), unless the authorization is terminated  or revoked sooner.       Influenza A by PCR NEGATIVE NEGATIVE Final   Influenza B by PCR NEGATIVE NEGATIVE Final    Comment: (NOTE) The Xpert Xpress SARS-CoV-2/FLU/RSV plus assay is intended as an aid in the diagnosis of influenza from Nasopharyngeal swab specimens and should not be used as a sole basis for treatment. Nasal washings and aspirates are unacceptable for Xpert Xpress SARS-CoV-2/FLU/RSV testing.  Fact Sheet for Patients: [facility]  Fact Sheet for Healthcare Providers: BloggerCourse.com  This test is not yet approved or cleared by the [employer] FDA and has been authorized for detection and/or diagnosis of SARS-CoV-2 by FDA under an Emergency Use Authorization (EUA). This EUA will remain in effect (meaning this test can be used) for the  duration of the COVID-19 declaration under Section 564(b)(1) of the Act, 21 U.S.C. section 360bbb-3(b)(1), unless the authorization is terminated or revoked.  Performed at [facility], [address] [place], [place]      Radiological Exams on Admission: CT ABDOMEN PELVIS W CONTRAST  Result Date: 05/28/2021 CLINICAL DATA:  Abdominal pain EXAM: CT ABDOMEN AND PELVIS WITH CONTRAST TECHNIQUE: Multidetector CT imaging of the abdomen and pelvis was performed using the standard protocol following bolus administration of intravenous contrast. RADIATION DOSE REDUCTION: This exam was performed according to the  departmental dose-optimization program which includes automated exposure control, adjustment of the mA and/or kV according to patient size and/or use of iterative reconstruction technique. CONTRAST:  100mL OMNIPAQUE IOHEXOL 300 MG/ML  SOLN COMPARISON:  Ultrasound abdomen done earlier today FINDINGS: Lower chest: Unremarkable. Hepatobiliary: Liver measures 19.2 cm in length. There is fatty infiltration. Gallbladder is distended. There is no wall thickening in gallbladder. There is no fluid around the gallbladder. There is no significant dilation of bile ducts. Pancreas: There is peripancreatic stranding. There are no loculated fluid collections in the or around the pancreas. There is no dilation of pancreatic duct. Spleen: Unremarkable. Adrenals/Urinary Tract: Adrenals are not enlarged. There is no hydronephrosis. There are no renal or ureteral stones. Urinary bladder is unremarkable. Stomach/Bowel: Stomach is unremarkable. Small bowel loops are not dilated. Appendix is not seen. There is no focal pericecal inflammation. There is no significant wall thickening in colon. Scattered diverticula are seen in the left colon without evidence of focal acute diverticulitis. Vascular/Lymphatic: Unremarkable. Reproductive: There are small follicles in the ovaries. Uterus is to the right  of midline. Trace amount of free fluid is seen in the cul-de-sac. Other: There is no signal ascites or pneumoperitoneum. Musculoskeletal: Unremarkable. IMPRESSION: There is peripancreatic edema suggesting acute pancreatitis. There is no evidence of pancreatic necrosis. There are no loculated fluid collections in or around the pancreas. There is no evidence of intestinal obstruction or pneumoperitoneum. There is no hydronephrosis. Enlarged fatty liver. Trace amount of free fluid in cul-de-sac and pelvis may suggest physiological rupture of ovarian follicle. Diverticulosis of colon without signs of focal diverticulitis. Electronically Signed   By: Ernie AvenaPalani  Rathinasamy M.D.   On: 05/28/2021 16:48   US Abdomen Limited RUQ (LIVER/GB)  Result Date: 05/28/2021 CLINICAL DATA:  Right upper quadrant pain. EXAM: ULTRASOUND ABDOMEN LIMITED RIGHT UPPER QUADRANT COMPARISON:  None. FINDINGS: Gallbladder: Multiple gallstones measuring up to 0.8 cm. No gallbladder wall thickening. No sonographic Murphy sign noted by sonographer. Common bile duct: Diameter: 0.7 cm, borderline dilated Liver: No focal lesion identified. Within normal limits in parenchymal echogenicity. Portal vein is patent on color Doppler imaging with normal direction of blood flow towards the liver. Other: None. IMPRESSION: 1.  Cholelithiasis without evidence of acute cholecystitis. 2. Borderline dilation of the common bile duct. If there is concern for biliary duct obstruction consider MRI/MRCP. Electronically Signed   By: Emmaline KluverNancy  Ballantyne M.D.   On: 05/28/2021 14:18     Assessment/Plan  1. Acute biliary pancreatitis  - Presents with acute-onset severe epigastric pain and found to have marked lipase elevation, elevated transaminases and bilirubin, and US with cholelithiasis and borderline CBD dilation  - GI was consulted by ED and pt was started on IVF, antibiotics, and pain-control - Continue IVF, bowel-rest, and pain-control, check MRCP, continue  antibiotics for possible cholangitis for now given leukocytosis   2. Depression, anxiety  - Stable, continue home regimen as tolerated    3. Asthma  - No cough, wheeze, or SOB on admission  - Continue as-needed albuterol    DVT prophylaxis: SCDs  Code Status: Full  Level of Care: Level of care: Med-Surg Family Communication: Family on videocall during visit  Disposition Plan:  Patient is from: Home  Anticipated d/c is to: Home  Anticipated d/c date is: 3/2 or 06/01/21  Patient currently: Plan pending MRCP  Consults called: GI  Admission status: Inpatient     Briscoe Deutscherimothy S Ariam Mol, MD [facility]  05/28/2021, 8:53 PM

## 2021-05-28 NOTE — ED Triage Notes (Signed)
Patient reports to the ER for abdominal and back pain with a 'gas bubble" feeling at 0300 this morning. Patient reports she also had nausea with emesis. LMP x2 weeks ago.

## 2021-05-28 NOTE — ED Notes (Signed)
Patient transported to Ultrasound 

## 2021-05-28 NOTE — ED Notes (Signed)
Patient transported to CT 

## 2021-05-29 DIAGNOSIS — E876 Hypokalemia: Secondary | ICD-10-CM

## 2021-05-29 DIAGNOSIS — K8020 Calculus of gallbladder without cholecystitis without obstruction: Secondary | ICD-10-CM

## 2021-05-29 DIAGNOSIS — K851 Biliary acute pancreatitis without necrosis or infection: Secondary | ICD-10-CM | POA: Diagnosis not present

## 2021-05-29 HISTORY — DX: Calculus of gallbladder without cholecystitis without obstruction: K80.20

## 2021-05-29 LAB — CBC
HCT: 39.4 % (ref 36.0–46.0)
Hemoglobin: 12.6 g/dL (ref 12.0–15.0)
MCH: 28.6 pg (ref 26.0–34.0)
MCHC: 32 g/dL (ref 30.0–36.0)
MCV: 89.5 fL (ref 80.0–100.0)
Platelets: 277 10*3/uL (ref 150–400)
RBC: 4.4 MIL/uL (ref 3.87–5.11)
RDW: 13.6 % (ref 11.5–15.5)
WBC: 8 10*3/uL (ref 4.0–10.5)
nRBC: 0 % (ref 0.0–0.2)

## 2021-05-29 LAB — COMPREHENSIVE METABOLIC PANEL
ALT: 394 U/L — ABNORMAL HIGH (ref 0–44)
AST: 210 U/L — ABNORMAL HIGH (ref 15–41)
Albumin: 3.7 g/dL (ref 3.5–5.0)
Alkaline Phosphatase: 96 U/L (ref 38–126)
Anion gap: 6 (ref 5–15)
BUN: 7 mg/dL (ref 6–20)
CO2: 26 mmol/L (ref 22–32)
Calcium: 8.2 mg/dL — ABNORMAL LOW (ref 8.9–10.3)
Chloride: 104 mmol/L (ref 98–111)
Creatinine, Ser: 0.69 mg/dL (ref 0.44–1.00)
GFR, Estimated: >60 mL/min (ref >60)
Glucose, Bld: 105 mg/dL — ABNORMAL HIGH (ref 70–99)
Potassium: 3.3 mmol/L — ABNORMAL LOW (ref 3.5–5.1)
Sodium: 136 mmol/L (ref 135–145)
Total Bilirubin: 1 mg/dL (ref 0.3–1.2)
Total Protein: 6.6 g/dL (ref 6.5–8.1)

## 2021-05-29 LAB — PROTIME-INR
INR: 1 (ref 0.8–1.2)
Prothrombin Time: 13 seconds (ref 11.4–15.2)

## 2021-05-29 LAB — PHOSPHORUS: Phosphorus: 3.4 mg/dL (ref 2.5–4.6)

## 2021-05-29 LAB — MAGNESIUM: Magnesium: 2 mg/dL (ref 1.7–2.4)

## 2021-05-29 LAB — HIV ANTIBODY (ROUTINE TESTING W REFLEX): HIV Screen 4th Generation wRfx: NONREACTIVE

## 2021-05-29 LAB — LIPASE, BLOOD: Lipase: 512 U/L — ABNORMAL HIGH (ref 11–51)

## 2021-05-29 MED ORDER — LACTATED RINGERS IV SOLN: INTRAVENOUS | Status: DC

## 2021-05-29 MED ORDER — POLYETHYLENE GLYCOL 3350 17 G PO PACK: 17.0000 g | PACK | Freq: Every day | ORAL | Status: DC | PRN

## 2021-05-29 MED ORDER — POTASSIUM CHLORIDE CRYS ER 20 MEQ PO TBCR
40.0000 meq | EXTENDED_RELEASE_TABLET | Freq: Once | ORAL | Status: AC
  Administered 2021-05-29: 40 meq via ORAL
  Filled 2021-05-29: qty 2

## 2021-05-29 NOTE — Consult Note (Signed)
Referring Provider: [facility] Primary Care Physician:  Gwyneth Sprout, FNP Primary Gastroenterologist:  Althia Forts  Reason for Consultation:  acute biliary pancreatitis  HPI: Yolanda Tyler is a 36 y.o. female  with medical history significant for depression, anxiety, asthma, and BMI 41.   Patient presented to the ED 2/27 for severe epigastric abdominal pain.  Patient states she woke up at 3 AM with severe pain in the epigastric region that radiated towards her lower abdomen and left scapula.  States the pain felt like a severe gas pain.  Had 1 episode of emesis that was orange and contains small amount of food.    In the ED her labs are notable for AST 599, ALT 520, bilirubin 1.8 and lipase greater than 10,000.  CBC shows leukocytosis to 14,000.  Has been started on Cipro and Flagyl.  Ultrasound showed cholelithiasis and borderline dilation of CBD.  CT scan showing peripancreatic edema suggesting acute pancreatitis.  No CBD stone shown on MRCP.  patient received fentanyl yesterday for pain control patient is doing well today without fentanyl.   States her pain is improved at this time. States she still has some pressure in her epigastric region.  Patient states she has also had increasing constipation over the last 2 or 3 months.  Last bowel movement yesterday stool was hard and passed with straining.  Denies melena or hematochezia.   Denies frequent alcohol use.  Will have less than 1 alcoholic beverage per week on average.  Denies smoking history.  Denies starting any new medications recently.   Past Medical History:  Diagnosis Date   Allergy    Anxiety    Asthma    Depression    History of colposcopy    Mental disorder    depression and anxiety   Vaginal Pap smear, abnormal     Past Surgical History:  Procedure Laterality Date   BUNIONECTOMY     WISDOM TOOTH EXTRACTION      Prior to Admission medications   Medication Sig Start Date End Date Taking? Authorizing Provider   albuterol (VENTOLIN HFA) 108 (90 Base) MCG/ACT inhaler INHALE 2 PUFFS BY MOUTH EVERY 6 HOURS AS NEEDED FOR WHEEZE OR SHORTNESS OF BREATH 05/03/21   Gwyneth Sprout, FNP  azithromycin (ZITHROMAX) 250 MG tablet Take 500 mg once, then 250 mg for four days 04/22/21   Evelina Dun A, FNP  benzonatate (TESSALON PERLES) 100 MG capsule Take 1 capsule (100 mg total) by mouth 3 (three) times daily as needed. 04/22/21   Evelina Dun A, FNP  buPROPion (WELLBUTRIN XL) 150 MG 24 hr tablet Take 150 mg by mouth daily. 12/19/20   [provider]  desvenlafaxine (PRISTIQ) 100 MG 24 hr tablet Take 100 mg by mouth daily. 11/21/19   [provider]  desvenlafaxine (PRISTIQ) 50 MG 24 hr tablet Take 50 mg by mouth daily.    [provider]  hydrocortisone 2.5 % lotion Apply topically. 07/01/20   [provider]  ketoconazole (NIZORAL) 2 % shampoo Apply topically daily. 08/06/20   [provider]  lamoTRIgine (LAMICTAL) 100 MG tablet Take 100 mg by mouth daily.    [provider]  lamoTRIgine (LAMICTAL) 200 MG tablet Take 200 mg by mouth daily. 03/20/21   [provider]  metFORMIN (GLUCOPHAGE XR) 750 MG 24 hr tablet Take 1 tablet (750 mg total) by mouth daily with breakfast. 04/10/21   Gwyneth Sprout, FNP  methylphenidate (RITALIN) 20 MG tablet Take 30 mg by mouth  daily. 01/28/21   [provider]  predniSONE (STERAPRED UNI-PAK 21 TAB) 10 MG (21) TBPK tablet Take by mouth daily. Take 6 tabs by mouth daily  for 2 days, then 5 tabs for 2 days, then 4 tabs for 2 days, then 3 tabs for 2 days, 2 tabs for 2 days, then 1 tab by mouth daily for 2 days 04/19/21   Hazel Sams, PA-C  traZODone (DESYREL) 100 MG tablet Take 100 mg by mouth at bedtime.    [provider]    Scheduled Meds:  buPROPion  150 mg Oral Daily   DULoxetine  40 mg Oral BID   lamoTRIgine  200 mg Oral Daily   potassium chloride  40 mEq Oral Once   traZODone  100 mg Oral QHS    Continuous Infusions:  sodium chloride     ciprofloxacin 400 mg (05/29/21 0319)   lactated ringers     metronidazole 500 mg (05/29/21 0617)   PRN Meds:.sodium chloride, albuterol, fentaNYL (SUBLIMAZE) injection, ondansetron (ZOFRAN) IV  Allergies as of 05/28/2021 - Review Complete 05/28/2021  Allergen Reaction Noted   Metformin and related Diarrhea 12/29/2019   Penicillins Hives 05/06/2018   Clindamycin/lincomycin Rash 01/26/2020    Family History  Problem Relation Age of Onset   Graves' disease Mother    Depression Father    Bone cancer Maternal Grandfather    Heart disease Paternal Grandmother     Social History   Socioeconomic History   Marital status: Soil scientist    Spouse name: Not on file   Number of children: Not on file   Years of education: Not on file   Highest education level: Not on file  Occupational History   Not on file  Tobacco Use   Smoking status: Never   Smokeless tobacco: Never  Vaping Use   Vaping Use: Never used  Substance and Sexual Activity   Alcohol use: Not Currently   Drug use: Not Currently   Sexual activity: Yes    Birth control/protection: None  Other Topics Concern   Not on file  Social History Narrative   Not on file   Social Determinants of Health   Financial Resource Strain: Not on file  Food Insecurity: Not on file  Transportation Needs: Not on file  Physical Activity: Not on file  Stress: Not on file  Social Connections: Not on file  Intimate Partner Violence: Not on file    Review of Systems: Review of Systems  Constitutional:  Negative for chills, fever and weight loss.  HENT:  Negative for ear pain and hearing loss.   Eyes:  Negative for blurred vision and double vision.  Respiratory:  Negative for cough and shortness of breath.   Cardiovascular:  Negative for chest pain and palpitations.  Gastrointestinal:  Positive for abdominal pain, constipation, nausea and vomiting. Negative for blood in stool,  diarrhea, heartburn and melena.  Genitourinary:  Negative for dysuria and urgency.  Musculoskeletal:  Positive for back pain. Negative for myalgias and neck pain.  Skin:  Negative for itching and rash.  Neurological:  Negative for dizziness and headaches.  Endo/Heme/Allergies:  Negative for environmental allergies. Does not bruise/bleed easily.  Psychiatric/Behavioral:  Negative for depression and substance abuse.    Physical Exam:Physical Exam Constitutional:      General: She is not in acute distress.    Appearance: She is well-developed. She is not ill-appearing.  HENT:     Head: Normocephalic and atraumatic.  Cardiovascular:  Rate and Rhythm: Normal rate and regular rhythm.     Heart sounds: No murmur heard.   No friction rub. No gallop.  Pulmonary:     Effort: Pulmonary effort is normal. No respiratory distress.     Breath sounds: Normal breath sounds. No wheezing.  Abdominal:     General: Abdomen is protuberant. Bowel sounds are normal. There is no distension. There are no signs of injury.     Palpations: Abdomen is soft. There is no shifting dullness, fluid wave, hepatomegaly, splenomegaly, mass or pulsatile mass.     Tenderness: There is abdominal tenderness (mild) in the epigastric area. There is no guarding or rebound. Negative signs include Murphy's sign.     Hernia: No hernia is present.  Skin:    General: Skin is warm and dry.  Neurological:     General: No focal deficit present.     Mental Status: She is alert and oriented to person, place, and time.  Psychiatric:        Mood and Affect: Mood normal.        Behavior: Behavior normal.    Vital signs: Vitals:   05/29/21 0214 05/29/21 0616  BP: 111/60 118/66  Pulse: 80 89  Resp: 14 14  Temp: 97.7 F (36.5 C) 98.2 F (36.8 C)  SpO2: 95% 98%   Last BM Date : 05/28/21    GI:  Lab Results: Recent Labs    05/28/21 1320 05/29/21 0516  WBC 14.0* 8.0  HGB 12.8 12.6  HCT 40.4 39.4  PLT 353 277    BMET Recent Labs    05/28/21 1320 05/29/21 0516  NA 136 136  K 3.5 3.3*  CL 100 104  CO2 25 26  GLUCOSE 130* 105*  BUN 11 7  CREATININE 0.72 0.69  CALCIUM 9.4 8.2*   LFT Recent Labs    05/29/21 0516  PROT 6.6  ALBUMIN 3.7  AST 210*  ALT 394*  ALKPHOS 96  BILITOT 1.0   PT/INR Recent Labs    05/29/21 0516  LABPROT 13.0  INR 1.0     Studies/Results: CT ABDOMEN PELVIS W CONTRAST  Result Date: 05/28/2021 CLINICAL DATA:  Abdominal pain EXAM: CT ABDOMEN AND PELVIS WITH CONTRAST TECHNIQUE: Multidetector CT imaging of the abdomen and pelvis was performed using the standard protocol following bolus administration of intravenous contrast. RADIATION DOSE REDUCTION: This exam was performed according to the departmental dose-optimization program which includes automated exposure control, adjustment of the mA and/or kV according to patient size and/or use of iterative reconstruction technique. CONTRAST:  165mL OMNIPAQUE IOHEXOL 300 MG/ML  SOLN COMPARISON:  Ultrasound abdomen done earlier today FINDINGS: Lower chest: Unremarkable. Hepatobiliary: Liver measures 19.2 cm in length. There is fatty infiltration. Gallbladder is distended. There is no wall thickening in gallbladder. There is no fluid around the gallbladder. There is no significant dilation of bile ducts. Pancreas: There is peripancreatic stranding. There are no loculated fluid collections in the or around the pancreas. There is no dilation of pancreatic duct. Spleen: Unremarkable. Adrenals/Urinary Tract: Adrenals are not enlarged. There is no hydronephrosis. There are no renal or ureteral stones. Urinary bladder is unremarkable. Stomach/Bowel: Stomach is unremarkable. Small bowel loops are not dilated. Appendix is not seen. There is no focal pericecal inflammation. There is no significant wall thickening in colon. Scattered diverticula are seen in the left colon without evidence of focal acute diverticulitis. Vascular/Lymphatic:  Unremarkable. Reproductive: There are small follicles in the ovaries. Uterus is to the right of midline.  Trace amount of free fluid is seen in the cul-de-sac. Other: There is no signal ascites or pneumoperitoneum. Musculoskeletal: Unremarkable. IMPRESSION: There is peripancreatic edema suggesting acute pancreatitis. There is no evidence of pancreatic necrosis. There are no loculated fluid collections in or around the pancreas. There is no evidence of intestinal obstruction or pneumoperitoneum. There is no hydronephrosis. Enlarged fatty liver. Trace amount of free fluid in cul-de-sac and pelvis may suggest physiological rupture of ovarian follicle. Diverticulosis of colon without signs of focal diverticulitis. Electronically Signed   By: Elmer Picker M.D.   On: 05/28/2021 16:48   MR 3D Recon At Scanner  Result Date: 05/29/2021 CLINICAL DATA:  Right upper quadrant pain. EXAM: MRI ABDOMEN WITH CONTRAST (WITH MRCP) TECHNIQUE: Multiplanar multisequence MR imaging of the abdomen was performed following the administration of intravenous contrast. Heavily T2-weighted images of the biliary and pancreatic ducts were obtained, and three-dimensional MRCP images were rendered by post processing. CONTRAST:  46mL GADAVIST GADOBUTROL 1 MMOL/ML IV SOLN COMPARISON:  CT scan earlier same day FINDINGS: Lower chest: Unremarkable. Hepatobiliary: No suspicious focal abnormality within the liver parenchyma. Multiple gallstones evident measuring up to approximately 6 mm maximum size. No gallbladder wall thickening. No biliary dilatation. Pancreas: No main duct dilatation or focal pancreatic mass lesion. There is peripancreatic edema around the head of the pancreas, tracking into the pancreatico duodenal groove and along the transverse segment of the duodenum. Edema in the anterior pararenal space noted along the tail of pancreas extending laterally on towards the left paracolic gutter. No evidence for enhancing pancreatic mass  lesion. Spleen:  No splenomegaly. No focal mass lesion. Adrenals/Urinary Tract: No adrenal nodule or mass. Kidneys unremarkable. Stomach/Bowel: Stomach is unremarkable. No gastric wall thickening. No evidence of outlet obstruction. As noted above, there is edema surrounding the descending and transverse segments of the duodenum without appreciable duodenal wall thickening. No small bowel or colonic dilatation within the visualized abdomen. Vascular/Lymphatic: No abdominal aortic aneurysm. There is no gastrohepatic or hepatoduodenal ligament lymphadenopathy. No retroperitoneal or mesenteric lymphadenopathy. Other:  No substantial intraperitoneal free fluid. Musculoskeletal: No focal suspicious marrow enhancement within the visualized bony anatomy. IMPRESSION: 1. Cholelithiasis. No biliary dilatation or choledocholithiasis. 2. Peripancreatic edema extending along the descending and transverse segments of the duodenum. No appreciable duodenal wall thickening. Imaging features most suggestive of pancreatitis. No evidence for pancreatic necrosis or pseudocyst. Duodenitis considered less likely. Electronically Signed   By: Misty Stanley M.D.   On: 05/29/2021 05:25   MR ABDOMEN WITH MRCP W CONTRAST  Result Date: 05/29/2021 CLINICAL DATA:  Right upper quadrant pain. EXAM: MRI ABDOMEN WITH CONTRAST (WITH MRCP) TECHNIQUE: Multiplanar multisequence MR imaging of the abdomen was performed following the administration of intravenous contrast. Heavily T2-weighted images of the biliary and pancreatic ducts were obtained, and three-dimensional MRCP images were rendered by post processing. CONTRAST:  50mL GADAVIST GADOBUTROL 1 MMOL/ML IV SOLN COMPARISON:  CT scan earlier same day FINDINGS: Lower chest: Unremarkable. Hepatobiliary: No suspicious focal abnormality within the liver parenchyma. Multiple gallstones evident measuring up to approximately 6 mm maximum size. No gallbladder wall thickening. No biliary dilatation.  Pancreas: No main duct dilatation or focal pancreatic mass lesion. There is peripancreatic edema around the head of the pancreas, tracking into the pancreatico duodenal groove and along the transverse segment of the duodenum. Edema in the anterior pararenal space noted along the tail of pancreas extending laterally on towards the left paracolic gutter. No evidence for enhancing pancreatic mass lesion. Spleen:  No splenomegaly. No focal  mass lesion. Adrenals/Urinary Tract: No adrenal nodule or mass. Kidneys unremarkable. Stomach/Bowel: Stomach is unremarkable. No gastric wall thickening. No evidence of outlet obstruction. As noted above, there is edema surrounding the descending and transverse segments of the duodenum without appreciable duodenal wall thickening. No small bowel or colonic dilatation within the visualized abdomen. Vascular/Lymphatic: No abdominal aortic aneurysm. There is no gastrohepatic or hepatoduodenal ligament lymphadenopathy. No retroperitoneal or mesenteric lymphadenopathy. Other:  No substantial intraperitoneal free fluid. Musculoskeletal: No focal suspicious marrow enhancement within the visualized bony anatomy. IMPRESSION: 1. Cholelithiasis. No biliary dilatation or choledocholithiasis. 2. Peripancreatic edema extending along the descending and transverse segments of the duodenum. No appreciable duodenal wall thickening. Imaging features most suggestive of pancreatitis. No evidence for pancreatic necrosis or pseudocyst. Duodenitis considered less likely. Electronically Signed   By: Misty Stanley M.D.   On: 05/29/2021 05:25   US Abdomen Limited RUQ (LIVER/GB)  Result Date: 05/28/2021 CLINICAL DATA:  Right upper quadrant pain. EXAM: ULTRASOUND ABDOMEN LIMITED RIGHT UPPER QUADRANT COMPARISON:  None. FINDINGS: Gallbladder: Multiple gallstones measuring up to 0.8 cm. No gallbladder wall thickening. No sonographic Murphy sign noted by sonographer. Common bile duct: Diameter: 0.7 cm, borderline  dilated Liver: No focal lesion identified. Within normal limits in parenchymal echogenicity. Portal vein is patent on color Doppler imaging with normal direction of blood flow towards the liver. Other: None. IMPRESSION: 1.  Cholelithiasis without evidence of acute cholecystitis. 2. Borderline dilation of the common bile duct. If there is concern for biliary duct obstruction consider MRI/MRCP. Electronically Signed   By: Audie Pinto M.D.   On: 05/28/2021 14:18    Impression: Acute biliary pancreatitis Elevated LFTs Cholelithiasis  HGB 12.6 Platelets 277 AST 210 ALT 394  Alkphos 96 TBili 1.0  RUQ ultrasound 05/28/2021 Cholelithiasis without evidence of acute cholecystitis Common bile duct borderline dilated  MRI MRCP 05/29/2021 Cholelithiasis, no biliary dilation or choledocholithiasis Peripancreatic edema suggestive of pancreatitis, no evidence of pancreatic necrosis or pseudocyst  Possible gallstone pancreatitis, patient denies frequent alcohol use, will evaluate for hypertriglyceridemia with labs.   Plan: Planning for cholecystectomy tomorrow with general surgery. Continue to push IV fluids We will advance diet as tolerated after surgery.  Continue pain control as needed. Ordering triglycerides to work-up for hypertriglyceridemia as possible cause of pancreatitis. [facility] will follow   LOS: 1 day   Charlott Rakes  PA-C 05/29/2021, 8:46 AM  Contact #  [phone]

## 2021-05-29 NOTE — Assessment & Plan Note (Addendum)
RUQ [place] and MRCP with cholelithiasis but no choledocholithiasis.  She might have passed biliary stone.  Does not drink alcohol.  Triglyceride 81.  No necrosis, pseudocyst or signs of infection.  Lipase trended from > 10,000>> 44.  LFT resolving.  Tolerated soft diet. -Underwent lap chole on 3/1. -Received Cipro Flagyl from 2/27-3/2.  -Outpatient follow-up with general surgery -Check CMP at follow-up.

## 2021-05-29 NOTE — Progress Notes (Signed)
°  Transition of Care [facility]) Screening Note   Patient Details  Name: Yolanda Tyler Date of Birth: [birth year]   Transition of Care [facility]) CM/SW Contact:    Ermine Spofford, Meriam Sprague, RN Phone Number: 05/29/2021, 1:28 PM    [facility] of [facility] has reviewed patient and no TOC needs have been identified at this time. We will continue to monitor patient advancement through interdisciplinary progression rounds. If new patient transition needs arise, please place a TOC consult.

## 2021-05-29 NOTE — Assessment & Plan Note (Addendum)
Stable.  Only on albuterol at home. -Continue inhalers as needed

## 2021-05-29 NOTE — Hospital Course (Addendum)
36 year old F with PMH of morbid obesity, anxiety and depression presenting with fever, nausea, vomiting and abdominal pain found to have acute calculus pancreatitis.  Lipase > 10,000.  AST 600.  ALT 520.  Total bili 1.8.  WBC 14,000.  RUQ US demonstrated cholelithiasis with borderline CBD dilation.  CT A/P with peripancreatic edema but no necrosis or pseudocyst.  [facility] consulted.  MRI/MRCP showed cholelithiasis and pancreatitis but no biliary dilation, choledocholithiasis, pseudocyst or pancreatic necrosis.   Patient's pancreatitis improved.  General surgery consulted.  Underwent lap chole on 05/30/2021.  Pancreatitis resolved.  LFT improved tremendously.  Tolerated soft diet.  Cleared for discharge by general surgery for outpatient follow-up.

## 2021-05-29 NOTE — Consult Note (Signed)
[facility] Surgery Consult Note  Yolanda Tyler [facility] [birth year]  [id number].    Requesting MD: Antonieta Pert Chief Complaint/Reason for Consult: biliary pancreatitis  HPI:  Yolanda Tyler is a 36yo female who was transferred from [facility] to [facility] for admission for pancreatitis. Patient states that she woke up yesterday morning at 0300 with severe upper abdominal pain. Pain radiated around into her back and was associated with nausea and vomiting. Denies fever or chills. States that she has had similar but less severe pain multiple times over the last year. Pain would not relent so she came to the ED. ED work up significant for WBC 14, AST 599, ALT 520, Alk phos 85, Tbili 1.8, lipase >10,000. U/s showed cholelithiasis without evidence of acute cholecystitis, borderline dilation of common bile duct. CT scan shows peripancreatic edema suggesting acute pancreatitis; no evidence of pancreatic necrosis; no loculated fluid collections in or around the pancreas. MRCP negative for choledocholithiasis. Patient was admitted to the hospitalist service. General surgery asked to see. Today she states that she feels much better. Abdominal pain improved. She reports mild 2/10 epigastric discomfort. Denies n/v.   Abdominal surgical history: none Anticoagulants: none Nonsmoker Drinks alcohol occasionally Denies illicit drug use  Review of Systems  Constitutional: Negative.   Respiratory: Negative.    Cardiovascular: Negative.   Gastrointestinal:  Positive for abdominal pain, nausea and vomiting.   All systems reviewed and otherwise negative except for as above  Family History  Problem Relation Age of Onset   Graves' disease Mother    Depression Father    Bone cancer Maternal Grandfather    Heart disease Paternal Grandmother     Past Medical History:  Diagnosis Date   Allergy    Anxiety    Asthma    Depression    History of colposcopy    Mental disorder    depression and anxiety   Vaginal Pap  smear, abnormal     Past Surgical History:  Procedure Laterality Date   BUNIONECTOMY     WISDOM TOOTH EXTRACTION      Social History:  reports that she has never smoked. She has never used smokeless tobacco. She reports that she does not currently use alcohol. She reports that she does not currently use drugs.  Allergies:  Allergies  Allergen Reactions   Metformin And Related Diarrhea   Penicillins Hives   Clindamycin/Lincomycin Rash    Medications Prior to Admission  Medication Sig Dispense Refill   albuterol (VENTOLIN HFA) 108 (90 Base) MCG/ACT inhaler INHALE 2 PUFFS BY MOUTH EVERY 6 HOURS AS NEEDED FOR WHEEZE OR SHORTNESS OF BREATH 18 each 0   azithromycin (ZITHROMAX) 250 MG tablet Take 500 mg once, then 250 mg for four days 6 tablet 0   benzonatate (TESSALON PERLES) 100 MG capsule Take 1 capsule (100 mg total) by mouth 3 (three) times daily as needed. 20 capsule 0   buPROPion (WELLBUTRIN XL) 150 MG 24 hr tablet Take 150 mg by mouth daily.     desvenlafaxine (PRISTIQ) 100 MG 24 hr tablet Take 100 mg by mouth daily.     desvenlafaxine (PRISTIQ) 50 MG 24 hr tablet Take 50 mg by mouth daily.     hydrocortisone 2.5 % lotion Apply topically.     ketoconazole (NIZORAL) 2 % shampoo Apply topically daily.     lamoTRIgine (LAMICTAL) 100 MG tablet Take 100 mg by mouth daily.     lamoTRIgine (LAMICTAL) 200 MG tablet Take 200 mg by mouth daily.  metFORMIN (GLUCOPHAGE XR) 750 MG 24 hr tablet Take 1 tablet (750 mg total) by mouth daily with breakfast. 90 tablet 0   methylphenidate (RITALIN) 20 MG tablet Take 30 mg by mouth daily.     predniSONE (STERAPRED UNI-PAK 21 TAB) 10 MG (21) TBPK tablet Take by mouth daily. Take 6 tabs by mouth daily  for 2 days, then 5 tabs for 2 days, then 4 tabs for 2 days, then 3 tabs for 2 days, 2 tabs for 2 days, then 1 tab by mouth daily for 2 days 42 tablet 0   traZODone (DESYREL) 100 MG tablet Take 100 mg by mouth at bedtime.      Prior to Admission  medications   Medication Sig Start Date End Date Taking? Authorizing Provider  albuterol (VENTOLIN HFA) 108 (90 Base) MCG/ACT inhaler INHALE 2 PUFFS BY MOUTH EVERY 6 HOURS AS NEEDED FOR WHEEZE OR SHORTNESS OF BREATH 05/03/21   Gwyneth Sprout, FNP  azithromycin (ZITHROMAX) 250 MG tablet Take 500 mg once, then 250 mg for four days 04/22/21   Evelina Dun A, FNP  benzonatate (TESSALON PERLES) 100 MG capsule Take 1 capsule (100 mg total) by mouth 3 (three) times daily as needed. 04/22/21   Evelina Dun A, FNP  buPROPion (WELLBUTRIN XL) 150 MG 24 hr tablet Take 150 mg by mouth daily. 12/19/20   [provider]  desvenlafaxine (PRISTIQ) 100 MG 24 hr tablet Take 100 mg by mouth daily. 11/21/19   [provider]  desvenlafaxine (PRISTIQ) 50 MG 24 hr tablet Take 50 mg by mouth daily.    [provider]  hydrocortisone 2.5 % lotion Apply topically. 07/01/20   [provider]  ketoconazole (NIZORAL) 2 % shampoo Apply topically daily. 08/06/20   [provider]  lamoTRIgine (LAMICTAL) 100 MG tablet Take 100 mg by mouth daily.    [provider]  lamoTRIgine (LAMICTAL) 200 MG tablet Take 200 mg by mouth daily. 03/20/21   [provider]  metFORMIN (GLUCOPHAGE XR) 750 MG 24 hr tablet Take 1 tablet (750 mg total) by mouth daily with breakfast. 04/10/21   Gwyneth Sprout, FNP  methylphenidate (RITALIN) 20 MG tablet Take 30 mg by mouth daily. 01/28/21   [provider]  predniSONE (STERAPRED UNI-PAK 21 TAB) 10 MG (21) TBPK tablet Take by mouth daily. Take 6 tabs by mouth daily  for 2 days, then 5 tabs for 2 days, then 4 tabs for 2 days, then 3 tabs for 2 days, 2 tabs for 2 days, then 1 tab by mouth daily for 2 days 04/19/21   Hazel Sams, PA-C  traZODone (DESYREL) 100 MG tablet Take 100 mg by mouth at bedtime.    [provider]    Blood pressure 118/66, pulse 89, temperature 98.2 F (36.8 C), temperature source Oral, resp. rate 14, height  '5\' 5"'  (1.651 m), weight 111.1 kg, last menstrual period 05/14/2021, SpO2 98 %. Physical Exam: General: pleasant, WD/WN female who is laying in bed in NAD HEENT: head is normocephalic, atraumatic.  Sclera are noninjected.  Pupils equal and round.  Ears and nose without any masses or lesions.  Mouth is pink and moist. Dentition fair Heart: regular, rate, and rhythm.  Normal s1,s2. No obvious murmurs, gallops, or rubs noted.  Palpable pedal pulses bilaterally  Lungs: CTAB, no wheezes, rhonchi, or rales noted.  Respiratory effort nonlabored Abd: obese, soft, mild epigastric TTP without rebound or guarding, +BS, no masses, hernias, or organomegaly MS: no BUE/BLE  edema, calves soft and nontender Skin: warm and dry with no masses, lesions, or rashes Psych: A&Ox4 with an appropriate affect Neuro: cranial nerves grossly intact, equal strength in BUE/BLE bilaterally, normal speech, thought process intact  Results for orders placed or performed during the hospital encounter of 05/28/21 (from the past 48 hour(s))  Lipase, blood     Status: Abnormal   Collection Time: 05/28/21  1:20 PM  Result Value Ref Range   Lipase >10,000 (H) 11 - 51 U/L    Comment: RESULT CONFIRMED BY AUTOMATED DILUTION Performed at [facility], [address] [place], [place] [address]   Comprehensive metabolic panel     Status: Abnormal   Collection Time: 05/28/21  1:20 PM  Result Value Ref Range   Sodium 136 135 - 145 mmol/L   Potassium 3.5 3.5 - 5.1 mmol/L   Chloride 100 98 - 111 mmol/L   CO2 25 22 - 32 mmol/L   Glucose, Bld 130 (H) 70 - 99 mg/dL    Comment: Glucose reference range applies only to samples taken after fasting for at least 8 hours.   BUN 11 6 - 20 mg/dL   Creatinine, Ser 0.72 0.44 - 1.00 mg/dL   Calcium 9.4 8.9 - 10.3 mg/dL   Total Protein 7.2 6.5 - 8.1 g/dL   Albumin 4.3 3.5 - 5.0 g/dL   AST 599 (H) 15 - 41 U/L   ALT 520 (H) 0 - 44 U/L   Alkaline Phosphatase 85 38 - 126 U/L    Total Bilirubin 1.8 (H) 0.3 - 1.2 mg/dL   GFR, Estimated >60 >60 mL/min    Comment: (NOTE) Calculated using the CKD-EPI Creatinine Equation (2021)    Anion gap 11 5 - 15    Comment: Performed at [facility], [address], [place], [place] [address]  CBC     Status: Abnormal   Collection Time: 05/28/21  1:20 PM  Result Value Ref Range   WBC 14.0 (H) 4.0 - 10.5 K/uL   RBC 4.64 3.87 - 5.11 MIL/uL   Hemoglobin 12.8 12.0 - 15.0 g/dL   HCT 40.4 36.0 - 46.0 %   MCV 87.1 80.0 - 100.0 fL   MCH 27.6 26.0 - 34.0 pg   MCHC 31.7 30.0 - 36.0 g/dL   RDW 13.3 11.5 - 15.5 %   Platelets 353 150 - 400 K/uL   nRBC 0.0 0.0 - 0.2 %    Comment: Performed at [facility], [place], [place] [address]  Urinalysis, Routine w reflex microscopic Urine, Clean Catch     Status: Abnormal   Collection Time: 05/28/21  1:20 PM  Result Value Ref Range   Color, Urine YELLOW YELLOW   APPearance CLEAR CLEAR   Specific Gravity, Urine 1.020 1.005 - 1.030   pH 6.0 5.0 - 8.0   Glucose, UA NEGATIVE NEGATIVE mg/dL   Hgb urine dipstick NEGATIVE NEGATIVE   Bilirubin Urine SMALL (A) NEGATIVE   Ketones, ur NEGATIVE NEGATIVE mg/dL   Protein, ur TRACE (A) NEGATIVE mg/dL   Nitrite NEGATIVE NEGATIVE   Leukocytes,Ua NEGATIVE NEGATIVE    Comment: Performed at [facility], [address], [place], [place] [address]  Pregnancy, urine     Status: None   Collection Time: 05/28/21  1:20 PM  Result Value Ref Range   Preg Test, Ur NEGATIVE NEGATIVE    Comment:        THE SENSITIVITY OF THIS METHODOLOGY IS >20 mIU/mL. Performed at  [facility], [address], [place], [place] [address]   Resp Panel by RT-PCR (Flu A&B, Covid) Nasopharyngeal Swab     Status: None   Collection Time: 05/28/21  3:19 PM   Specimen: Nasopharyngeal Swab; Nasopharyngeal(NP) swabs in vial transport medium  Result Value Ref Range   SARS Coronavirus 2 by RT  PCR NEGATIVE NEGATIVE    Comment: (NOTE) SARS-CoV-2 target nucleic acids are NOT DETECTED.  The SARS-CoV-2 RNA is generally detectable in upper respiratory specimens during the acute phase of infection. The lowest concentration of SARS-CoV-2 viral copies this assay can detect is 138 copies/mL. A negative result does not preclude SARS-Cov-2 infection and should not be used as the sole basis for treatment or other patient management decisions. A negative result may occur with  improper specimen collection/handling, submission of specimen other than nasopharyngeal swab, presence of viral mutation(s) within the areas targeted by this assay, and inadequate number of viral copies(<138 copies/mL). A negative result must be combined with clinical observations, patient history, and epidemiological information. The expected result is Negative.  Fact Sheet for Patients:  EntrepreneurPulse.com.au  Fact Sheet for Healthcare Providers:  IncredibleEmployment.be  This test is no t yet approved or cleared by the [place] FDA and  has been authorized for detection and/or diagnosis of SARS-CoV-2 by FDA under an Emergency Use Authorization (EUA). This EUA will remain  in effect (meaning this test can be used) for the duration of the COVID-19 declaration under Section 564(b)(1) of the Act, 21 U.S.C.section 360bbb-3(b)(1), unless the authorization is terminated  or revoked sooner.       Influenza A by PCR NEGATIVE NEGATIVE   Influenza B by PCR NEGATIVE NEGATIVE    Comment: (NOTE) The Xpert Xpress SARS-CoV-2/FLU/RSV plus assay is intended as an aid in the diagnosis of influenza from Nasopharyngeal swab specimens and should not be used as a sole basis for treatment. Nasal washings and aspirates are unacceptable for Xpert Xpress SARS-CoV-2/FLU/RSV testing.  Fact Sheet for Patients: EntrepreneurPulse.com.au  Fact Sheet for Healthcare  Providers: IncredibleEmployment.be  This test is not yet approved or cleared by the [place] FDA and has been authorized for detection and/or diagnosis of SARS-CoV-2 by FDA under an Emergency Use Authorization (EUA). This EUA will remain in effect (meaning this test can be used) for the duration of the COVID-19 declaration under Section 564(b)(1) of the Act, 21 U.S.C. section 360bbb-3(b)(1), unless the authorization is terminated or revoked.  Performed at [facility], [address], [place], [place] [address]   Comprehensive metabolic panel     Status: Abnormal   Collection Time: 05/29/21  5:16 AM  Result Value Ref Range   Sodium 136 135 - 145 mmol/L   Potassium 3.3 (L) 3.5 - 5.1 mmol/L   Chloride 104 98 - 111 mmol/L   CO2 26 22 - 32 mmol/L   Glucose, Bld 105 (H) 70 - 99 mg/dL    Comment: Glucose reference range applies only to samples taken after fasting for at least 8 hours.   BUN 7 6 - 20 mg/dL   Creatinine, Ser 0.69 0.44 - 1.00 mg/dL   Calcium 8.2 (L) 8.9 - 10.3 mg/dL   Total Protein 6.6 6.5 - 8.1 g/dL   Albumin 3.7 3.5 - 5.0 g/dL   AST 210 (H) 15 - 41 U/L   ALT 394 (H) 0 - 44 U/L   Alkaline Phosphatase 96 38 - 126 U/L   Total Bilirubin 1.0 0.3 - 1.2 mg/dL   GFR,  Estimated >60 >60 mL/min    Comment: (NOTE) Calculated using the CKD-EPI Creatinine Equation (2021)    Anion gap 6 5 - 15    Comment: Performed at [facility], [place] [address] [place], [place] [address]  Magnesium     Status: None   Collection Time: 05/29/21  5:16 AM  Result Value Ref Range   Magnesium 2.0 1.7 - 2.4 mg/dL    Comment: Performed at [facility], [place] [address] [place], [place] [address]  Phosphorus     Status: None   Collection Time: 05/29/21  5:16 AM  Result Value Ref Range   Phosphorus 3.4 2.5 - 4.6 mg/dL    Comment: Performed at [facility], [place] [address] [place], [place] [address]   CBC     Status: None   Collection Time: 05/29/21  5:16 AM  Result Value Ref Range   WBC 8.0 4.0 - 10.5 K/uL   RBC 4.40 3.87 - 5.11 MIL/uL   Hemoglobin 12.6 12.0 - 15.0 g/dL   HCT 39.4 36.0 - 46.0 %   MCV 89.5 80.0 - 100.0 fL   MCH 28.6 26.0 - 34.0 pg   MCHC 32.0 30.0 - 36.0 g/dL   RDW 13.6 11.5 - 15.5 %   Platelets 277 150 - 400 K/uL   nRBC 0.0 0.0 - 0.2 %    Comment: Performed at [facility], [place] [address], [place], [place] [address]  Protime-INR     Status: None   Collection Time: 05/29/21  5:16 AM  Result Value Ref Range   Prothrombin Time 13.0 11.4 - 15.2 seconds   INR 1.0 0.8 - 1.2    Comment: (NOTE) INR goal varies based on device and disease states. Performed at [facility], [place] [address] [place], [place] [address]    CT ABDOMEN PELVIS W CONTRAST  Result Date: 05/28/2021 CLINICAL DATA:  Abdominal pain EXAM: CT ABDOMEN AND PELVIS WITH CONTRAST TECHNIQUE: Multidetector CT imaging of the abdomen and pelvis was performed using the standard protocol following bolus administration of intravenous contrast. RADIATION DOSE REDUCTION: This exam was performed according to the departmental dose-optimization program which includes automated exposure control, adjustment of the mA and/or kV according to patient size and/or use of iterative reconstruction technique. CONTRAST:  124m OMNIPAQUE IOHEXOL 300 MG/ML  SOLN COMPARISON:  Ultrasound abdomen done earlier today FINDINGS: Lower chest: Unremarkable. Hepatobiliary: Liver measures 19.2 cm in length. There is fatty infiltration. Gallbladder is distended. There is no wall thickening in gallbladder. There is no fluid around the gallbladder. There is no significant dilation of bile ducts. Pancreas: There is peripancreatic stranding. There are no loculated fluid collections in the or around the pancreas. There is no dilation of pancreatic duct. Spleen: Unremarkable. Adrenals/Urinary Tract: Adrenals are not  enlarged. There is no hydronephrosis. There are no renal or ureteral stones. Urinary bladder is unremarkable. Stomach/Bowel: Stomach is unremarkable. Small bowel loops are not dilated. Appendix is not seen. There is no focal pericecal inflammation. There is no significant wall thickening in colon. Scattered diverticula are seen in the left colon without evidence of focal acute diverticulitis. Vascular/Lymphatic: Unremarkable. Reproductive: There are small follicles in the ovaries. Uterus is to the right of midline. Trace amount of free fluid is seen in the cul-de-sac. Other: There is no signal ascites or pneumoperitoneum. Musculoskeletal: Unremarkable. IMPRESSION: There is peripancreatic edema suggesting acute pancreatitis. There is no evidence of pancreatic necrosis. There are no loculated fluid collections  in or around the pancreas. There is no evidence of intestinal obstruction or pneumoperitoneum. There is no hydronephrosis. Enlarged fatty liver. Trace amount of free fluid in cul-de-sac and pelvis may suggest physiological rupture of ovarian follicle. Diverticulosis of colon without signs of focal diverticulitis. Electronically Signed   By: Elmer Picker M.D.   On: 05/28/2021 16:48   MR 3D Recon At Scanner  Result Date: 05/29/2021 CLINICAL DATA:  Right upper quadrant pain. EXAM: MRI ABDOMEN WITH CONTRAST (WITH MRCP) TECHNIQUE: Multiplanar multisequence MR imaging of the abdomen was performed following the administration of intravenous contrast. Heavily T2-weighted images of the biliary and pancreatic ducts were obtained, and three-dimensional MRCP images were rendered by post processing. CONTRAST:  38m GADAVIST GADOBUTROL 1 MMOL/ML IV SOLN COMPARISON:  CT scan earlier same day FINDINGS: Lower chest: Unremarkable. Hepatobiliary: No suspicious focal abnormality within the liver parenchyma. Multiple gallstones evident measuring up to approximately 6 mm maximum size. No gallbladder wall thickening. No  biliary dilatation. Pancreas: No main duct dilatation or focal pancreatic mass lesion. There is peripancreatic edema around the head of the pancreas, tracking into the pancreatico duodenal groove and along the transverse segment of the duodenum. Edema in the anterior pararenal space noted along the tail of pancreas extending laterally on towards the left paracolic gutter. No evidence for enhancing pancreatic mass lesion. Spleen:  No splenomegaly. No focal mass lesion. Adrenals/Urinary Tract: No adrenal nodule or mass. Kidneys unremarkable. Stomach/Bowel: Stomach is unremarkable. No gastric wall thickening. No evidence of outlet obstruction. As noted above, there is edema surrounding the descending and transverse segments of the duodenum without appreciable duodenal wall thickening. No small bowel or colonic dilatation within the visualized abdomen. Vascular/Lymphatic: No abdominal aortic aneurysm. There is no gastrohepatic or hepatoduodenal ligament lymphadenopathy. No retroperitoneal or mesenteric lymphadenopathy. Other:  No substantial intraperitoneal free fluid. Musculoskeletal: No focal suspicious marrow enhancement within the visualized bony anatomy. IMPRESSION: 1. Cholelithiasis. No biliary dilatation or choledocholithiasis. 2. Peripancreatic edema extending along the descending and transverse segments of the duodenum. No appreciable duodenal wall thickening. Imaging features most suggestive of pancreatitis. No evidence for pancreatic necrosis or pseudocyst. Duodenitis considered less likely. Electronically Signed   By: EMisty StanleyM.D.   On: 05/29/2021 05:25   MR ABDOMEN WITH MRCP W CONTRAST  Result Date: 05/29/2021 CLINICAL DATA:  Right upper quadrant pain. EXAM: MRI ABDOMEN WITH CONTRAST (WITH MRCP) TECHNIQUE: Multiplanar multisequence MR imaging of the abdomen was performed following the administration of intravenous contrast. Heavily T2-weighted images of the biliary and pancreatic ducts were  obtained, and three-dimensional MRCP images were rendered by post processing. CONTRAST:  1109mGADAVIST GADOBUTROL 1 MMOL/ML IV SOLN COMPARISON:  CT scan earlier same day FINDINGS: Lower chest: Unremarkable. Hepatobiliary: No suspicious focal abnormality within the liver parenchyma. Multiple gallstones evident measuring up to approximately 6 mm maximum size. No gallbladder wall thickening. No biliary dilatation. Pancreas: No main duct dilatation or focal pancreatic mass lesion. There is peripancreatic edema around the head of the pancreas, tracking into the pancreatico duodenal groove and along the transverse segment of the duodenum. Edema in the anterior pararenal space noted along the tail of pancreas extending laterally on towards the left paracolic gutter. No evidence for enhancing pancreatic mass lesion. Spleen:  No splenomegaly. No focal mass lesion. Adrenals/Urinary Tract: No adrenal nodule or mass. Kidneys unremarkable. Stomach/Bowel: Stomach is unremarkable. No gastric wall thickening. No evidence of outlet obstruction. As noted above, there is edema surrounding the descending and transverse segments of the duodenum without appreciable  duodenal wall thickening. No small bowel or colonic dilatation within the visualized abdomen. Vascular/Lymphatic: No abdominal aortic aneurysm. There is no gastrohepatic or hepatoduodenal ligament lymphadenopathy. No retroperitoneal or mesenteric lymphadenopathy. Other:  No substantial intraperitoneal free fluid. Musculoskeletal: No focal suspicious marrow enhancement within the visualized bony anatomy. IMPRESSION: 1. Cholelithiasis. No biliary dilatation or choledocholithiasis. 2. Peripancreatic edema extending along the descending and transverse segments of the duodenum. No appreciable duodenal wall thickening. Imaging features most suggestive of pancreatitis. No evidence for pancreatic necrosis or pseudocyst. Duodenitis considered less likely. Electronically Signed   By:  Misty Stanley M.D.   On: 05/29/2021 05:25   US Abdomen Limited RUQ (LIVER/GB)  Result Date: 05/28/2021 CLINICAL DATA:  Right upper quadrant pain. EXAM: ULTRASOUND ABDOMEN LIMITED RIGHT UPPER QUADRANT COMPARISON:  None. FINDINGS: Gallbladder: Multiple gallstones measuring up to 0.8 cm. No gallbladder wall thickening. No sonographic Murphy sign noted by sonographer. Common bile duct: Diameter: 0.7 cm, borderline dilated Liver: No focal lesion identified. Within normal limits in parenchymal echogenicity. Portal vein is patent on color Doppler imaging with normal direction of blood flow towards the liver. Other: None. IMPRESSION: 1.  Cholelithiasis without evidence of acute cholecystitis. 2. Borderline dilation of the common bile duct. If there is concern for biliary duct obstruction consider MRI/MRCP. Electronically Signed   By: Audie Pinto M.D.   On: 05/28/2021 14:18    Anti-infectives (From admission, onward)    Start     Dose/Rate Route Frequency Ordered Stop   05/29/21 0600  metroNIDAZOLE (FLAGYL) IVPB 500 mg        500 mg 100 mL/hr over 60 Minutes Intravenous Every 12 hours 05/28/21 1937     05/29/21 0300  ciprofloxacin (CIPRO) IVPB 400 mg        400 mg 200 mL/hr over 60 Minutes Intravenous Every 12 hours 05/28/21 1937     05/28/21 1515  ciprofloxacin (CIPRO) IVPB 400 mg       See Hyperspace for full Linked Orders Report.   400 mg 200 mL/hr over 60 Minutes Intravenous  Once 05/28/21 1504 05/28/21 1622   05/28/21 1515  metroNIDAZOLE (FLAGYL) IVPB 500 mg       See Hyperspace for full Linked Orders Report.   500 mg 100 mL/hr over 60 Minutes Intravenous  Once 05/28/21 1504 05/28/21 1754        Assessment/Plan Pancreatitis, suspect biliary pancreatitis  - Patient with suspected gallstone pancreatitis. Elevated LFTs are trending down and MRCP negative for choledocholithiasis. Plan for laparoscopic cholecystectomy once pancreatitis resolves. [place] for clear liquids today, NPO after  midnight. Repeat labs in the AM. Will evaluate patient in the morning to see if she is ready for cholecystectomy tomorrow.   ID - cipro/flagyl 2/27>> VTE - ok for chemical dvt ppx from surgical standpoint FEN - IVF, CLD, NPO after MN Foley - none  Depression/ anxiety Obesity BMI 40.77  Moderate Medical Decision Making  Wellington Hampshire, PA-C [facility] 05/29/2021, 9:15 AM Please see Amion for pager number during day hours 7:00am-4:30pm

## 2021-05-29 NOTE — Assessment & Plan Note (Addendum)
Body mass index is 40.77 kg/m. -Encourage lifestyle change to lose weight. -Continue metformin if she continues to tolerate

## 2021-05-29 NOTE — Assessment & Plan Note (Signed)
>>  ASSESSMENT AND PLAN FOR PREDIABETES WRITTEN ON 05/31/2021 10:19 PM BY GONFA, TAYE T, MD  A1c 5.7%. -Lifestyle change -Continue home metformin

## 2021-05-29 NOTE — Progress Notes (Signed)
PROGRESS NOTE Yolanda Tyler  GYJ:[id number] DOB: 04-09-1985 DOA: 05/28/2021 PCP: Jacky Kindle, FNP   Brief Narrative/Hospital Course: 36 y.o. female with anxietydepression, obesity w/ BMI 41, presented with f abdominal pain, nausea, and vomiting since 3 AM when he woke up with severe pain in the epigastrium that later began to radiate towards the mid abdomen and left scapula, had nausea with nonbloody vomiting  next morning but that has eased off. She rarely drinks alcohol and not to excess.  She had a normal triglyceride level last month.  [facility] ED Course: Upon arrival to the ED, patient is found to be afebrile and saturating well on room air with stable blood pressure.  Chemistry panel notable for AST 599, ALT 520, total bilirubin 1.8, and lipase > 10,000.  CBC features a leukocytosis to 14,000.  Ultrasound demonstrates cholelithiasis and borderline dilatation of CBD.  No necrosis or fluid collection on CT but peripancreatic edema noted.  [facility] was consulted by the ED physician, patient was given Cipro, Flagyl, fentanyl, Zofran, and IV fluids, and she was admitted to [facility] for admission. MRI/MRCP showed cholelithiasis, no biliary dilatation or choledocholithiasis, peripancreatic edema extending along the descending and transverse segment of the duodenum no pseudocyst or pancreatic necrosis.     Subjective: seen and examined this morning.  Patient reports improvement in the pain.  Assessment and Plan: * Acute biliary pancreatitis- (present on admission) Suspected acute biliary pancreatitis Cholelithiasis: Lipase is significantly improved LFTs downtrending pain is improved.  MRCP shows no CBD stone, discussed with Dr. Levora Angel from GI advised to consult surgery for operative intervention, surgery planning for cholecystectomy.  Continue to trend labs.  Continue current IV ciprofloxacin and Flagyl continue pain control  Hypokalemia Repleted  Cholelithiasis See  above.  Chronic asthma Respiratory status is stable  MDD (major depressive disorder)- (present on admission) Anxiety disorder: Mood stable continue home meds  Prediabetes- (present on admission) PCP F/U  Morbid obesity (HCC)- (present on admission) BMI 40.  Will benefit weight loss, PCP follow-up and consider outpatient sleep apnea evaluation    DVT prophylaxis: SCDs Start: 05/28/21 1936 Code Status:   Code Status: Full Code Family Communication: plan of care discussed with patient/spouse on phone  Disposition: Currently not medically stable for discharge. Status is: Inpatient Remains inpatient appropriate because: For ongoing management of symptomatic cholelithiasis and for need for surgery  Objective: Vitals last 24 hrs: Vitals:   05/28/21 1830 05/28/21 2225 05/29/21 0214 05/29/21 0616  BP: (!) 152/87 129/67 111/60 118/66  Pulse: 95 85 80 89  Resp: 18 14 14 14   Temp: 98.1 F (36.7 C) 98.3 F (36.8 C) 97.7 F (36.5 C) 98.2 F (36.8 C)  TempSrc: Oral Oral Oral Oral  SpO2: 100% 96% 95% 98%  Weight:      Height:       Weight change:   Physical Examination: General exam: AA0x3,older than stated age, weak appearing. HEENT:Oral mucosa moist, Ear/Nose WNL grossly, dentition normal. Respiratory system: bilaterally diminished BS, no use of accessory muscle Cardiovascular system: S1 & S2 +, No JVD,. Gastrointestinal system: Abdomen soft, mildly tender central abdomen,ND, BS+ Nervous System:Alert, awake, moving extremities and grossly nonfocal Extremities: LE edema none ,distal peripheral pulses palpable.  Skin: No rashes,no icterus. MSK: Normal muscle bulk,tone, power  Medications reviewed:  Scheduled Meds:  buPROPion  150 mg Oral Daily   DULoxetine  40 mg Oral BID   lamoTRIgine  200 mg Oral Daily   traZODone  100 mg Oral QHS  Continuous Infusions:  sodium chloride     ciprofloxacin 400 mg (05/29/21 0319)   lactated ringers 150 mL/hr at 05/29/21 1013    metronidazole 500 mg (05/29/21 0617)      Diet Order             Diet NPO time specified  Diet effective midnight           Diet clear liquid Room service appropriate? Yes; Fluid consistency: Thin  Diet effective now                    Intake/Output Summary (Last 24 hours) at 05/29/2021 1155 Last data filed at 05/29/2021 1139 Gross per 24 hour  Intake 1519.72 ml  Output --  Net 1519.72 ml   Net IO Since Admission: 1,519.72 mL [05/29/21 1155]  Wt Readings from Last 3 Encounters:  05/28/21 111.1 kg  04/04/21 112 kg  02/12/21 108.9 kg     Unresulted Labs (From admission, onward)     Start     Ordered   05/30/21 0500  Lipase, blood  Daily,   R      05/29/21 0836   05/29/21 0500  HIV Antibody (routine testing w rflx)  (HIV Antibody (Routine testing w reflex) panel)  Tomorrow morning,   R        05/28/21 1937   05/29/21 0500  Comprehensive metabolic panel  Daily,   R      05/28/21 1937   05/29/21 0500  CBC  Daily,   R      05/28/21 1937   Unscheduled  Triglycerides  Tomorrow morning,   R        05/29/21 1155          Data Reviewed: I have personally reviewed following labs and imaging studies CBC: Recent Labs  Lab 05/28/21 1320 05/29/21 0516  WBC 14.0* 8.0  HGB 12.8 12.6  HCT 40.4 39.4  MCV 87.1 89.5  PLT 353 277   Basic Metabolic Panel: Recent Labs  Lab 05/28/21 1320 05/29/21 0516  NA 136 136  K 3.5 3.3*  CL 100 104  CO2 25 26  GLUCOSE 130* 105*  BUN 11 7  CREATININE 0.72 0.69  CALCIUM 9.4 8.2*  MG  --  2.0  PHOS  --  3.4   GFR: Estimated Creatinine Clearance: 122.9 mL/min (by C-G formula based on SCr of 0.69 mg/dL). Liver Function Tests: Recent Labs  Lab 05/28/21 1320 05/29/21 0516  AST 599* 210*  ALT 520* 394*  ALKPHOS 85 96  BILITOT 1.8* 1.0  PROT 7.2 6.6  ALBUMIN 4.3 3.7   Recent Labs  Lab 05/28/21 1320 05/29/21 0516  LIPASE >10,000* 512*   No results for input(s): AMMONIA in the last 168 hours. Coagulation  Profile: Recent Labs  Lab 05/29/21 0516  INR 1.0   Cardiac Enzymes: No results for input(s): CKTOTAL, CKMB, CKMBINDEX, TROPONINI in the last 168 hours. BNP (last 3 results) No results for input(s): PROBNP in the last 8760 hours. HbA1C: No results for input(s): HGBA1C in the last 72 hours. CBG: No results for input(s): GLUCAP in the last 168 hours. Lipid Profile: No results for input(s): CHOL, HDL, LDLCALC, TRIG, CHOLHDL, LDLDIRECT in the last 72 hours. Thyroid Function Tests: No results for input(s): TSH, T4TOTAL, FREET4, T3FREE, THYROIDAB in the last 72 hours. Anemia Panel: No results for input(s): VITAMINB12, FOLATE, FERRITIN, TIBC, IRON, RETICCTPCT in the last 72 hours. Sepsis Labs: No results for input(s): PROCALCITON, LATICACIDVEN in the last  168 hours.  Recent Results (from the past 240 hour(s))  Resp Panel by RT-PCR (Flu A&B, Covid) Nasopharyngeal Swab     Status: None   Collection Time: 05/28/21  3:19 PM   Specimen: Nasopharyngeal Swab; Nasopharyngeal(NP) swabs in vial transport medium  Result Value Ref Range Status   SARS Coronavirus 2 by RT PCR NEGATIVE NEGATIVE Final    Comment: (NOTE) SARS-CoV-2 target nucleic acids are NOT DETECTED.  The SARS-CoV-2 RNA is generally detectable in upper respiratory specimens during the acute phase of infection. The lowest concentration of SARS-CoV-2 viral copies this assay can detect is 138 copies/mL. A negative result does not preclude SARS-Cov-2 infection and should not be used as the sole basis for treatment or other patient management decisions. A negative result may occur with  improper specimen collection/handling, submission of specimen other than nasopharyngeal swab, presence of viral mutation(s) within the areas targeted by this assay, and inadequate number of viral copies(<138 copies/mL). A negative result must be combined with clinical observations, patient history, and epidemiological information. The expected result  is Negative.  Fact Sheet for Patients:  BloggerCourse.com  Fact Sheet for Healthcare Providers:  SeriousBroker.it  This test is no t yet approved or cleared by the [place] FDA and  has been authorized for detection and/or diagnosis of SARS-CoV-2 by FDA under an Emergency Use Authorization (EUA). This EUA will remain  in effect (meaning this test can be used) for the duration of the COVID-19 declaration under Section 564(b)(1) of the Act, 21 U.S.C.section 360bbb-3(b)(1), unless the authorization is terminated  or revoked sooner.       Influenza A by PCR NEGATIVE NEGATIVE Final   Influenza B by PCR NEGATIVE NEGATIVE Final    Comment: (NOTE) The Xpert Xpress SARS-CoV-2/FLU/RSV plus assay is intended as an aid in the diagnosis of influenza from Nasopharyngeal swab specimens and should not be used as a sole basis for treatment. Nasal washings and aspirates are unacceptable for Xpert Xpress SARS-CoV-2/FLU/RSV testing.  Fact Sheet for Patients: BloggerCourse.com  Fact Sheet for Healthcare Providers: SeriousBroker.it  This test is not yet approved or cleared by the [place] FDA and has been authorized for detection and/or diagnosis of SARS-CoV-2 by FDA under an Emergency Use Authorization (EUA). This EUA will remain in effect (meaning this test can be used) for the duration of the COVID-19 declaration under Section 564(b)(1) of the Act, 21 U.S.C. section 360bbb-3(b)(1), unless the authorization is terminated or revoked.  Performed at [facility], [address] [place], [place] [address]     Antimicrobials: Anti-infectives (From admission, onward)    Start     Dose/Rate Route Frequency Ordered Stop   05/29/21 0600  metroNIDAZOLE (FLAGYL) IVPB 500 mg        500 mg 100 mL/hr over 60 Minutes Intravenous Every 12 hours 05/28/21 1937     05/29/21  0300  ciprofloxacin (CIPRO) IVPB 400 mg        400 mg 200 mL/hr over 60 Minutes Intravenous Every 12 hours 05/28/21 1937     05/28/21 1515  ciprofloxacin (CIPRO) IVPB 400 mg       See Hyperspace for full Linked Orders Report.   400 mg 200 mL/hr over 60 Minutes Intravenous  Once 05/28/21 1504 05/28/21 1622   05/28/21 1515  metroNIDAZOLE (FLAGYL) IVPB 500 mg       See Hyperspace for full Linked Orders Report.   500 mg 100 mL/hr over 60 Minutes Intravenous  Once 05/28/21 1504 05/28/21 1754  Culture/Microbiology No results found for: SDES, SPECREQUEST, CULT, REPTSTATUS  Radiology Studies: CT ABDOMEN PELVIS W CONTRAST  Result Date: 05/28/2021 CLINICAL DATA:  Abdominal pain EXAM: CT ABDOMEN AND PELVIS WITH CONTRAST TECHNIQUE: Multidetector CT imaging of the abdomen and pelvis was performed using the standard protocol following bolus administration of intravenous contrast. RADIATION DOSE REDUCTION: This exam was performed according to the departmental dose-optimization program which includes automated exposure control, adjustment of the mA and/or kV according to patient size and/or use of iterative reconstruction technique. CONTRAST:  100mL OMNIPAQUE IOHEXOL 300 MG/ML  SOLN COMPARISON:  Ultrasound abdomen done earlier today FINDINGS: Lower chest: Unremarkable. Hepatobiliary: Liver measures 19.2 cm in length. There is fatty infiltration. Gallbladder is distended. There is no wall thickening in gallbladder. There is no fluid around the gallbladder. There is no significant dilation of bile ducts. Pancreas: There is peripancreatic stranding. There are no loculated fluid collections in the or around the pancreas. There is no dilation of pancreatic duct. Spleen: Unremarkable. Adrenals/Urinary Tract: Adrenals are not enlarged. There is no hydronephrosis. There are no renal or ureteral stones. Urinary bladder is unremarkable. Stomach/Bowel: Stomach is unremarkable. Small bowel loops are not dilated. Appendix  is not seen. There is no focal pericecal inflammation. There is no significant wall thickening in colon. Scattered diverticula are seen in the left colon without evidence of focal acute diverticulitis. Vascular/Lymphatic: Unremarkable. Reproductive: There are small follicles in the ovaries. Uterus is to the right of midline. Trace amount of free fluid is seen in the cul-de-sac. Other: There is no signal ascites or pneumoperitoneum. Musculoskeletal: Unremarkable. IMPRESSION: There is peripancreatic edema suggesting acute pancreatitis. There is no evidence of pancreatic necrosis. There are no loculated fluid collections in or around the pancreas. There is no evidence of intestinal obstruction or pneumoperitoneum. There is no hydronephrosis. Enlarged fatty liver. Trace amount of free fluid in cul-de-sac and pelvis may suggest physiological rupture of ovarian follicle. Diverticulosis of colon without signs of focal diverticulitis. Electronically Signed   By: Ernie AvenaPalani  Rathinasamy M.D.   On: 05/28/2021 16:48   MR 3D Recon At Scanner  Result Date: 05/29/2021 CLINICAL DATA:  Right upper quadrant pain. EXAM: MRI ABDOMEN WITH CONTRAST (WITH MRCP) TECHNIQUE: Multiplanar multisequence MR imaging of the abdomen was performed following the administration of intravenous contrast. Heavily T2-weighted images of the biliary and pancreatic ducts were obtained, and three-dimensional MRCP images were rendered by post processing. CONTRAST:  10mL GADAVIST GADOBUTROL 1 MMOL/ML IV SOLN COMPARISON:  CT scan earlier same day FINDINGS: Lower chest: Unremarkable. Hepatobiliary: No suspicious focal abnormality within the liver parenchyma. Multiple gallstones evident measuring up to approximately 6 mm maximum size. No gallbladder wall thickening. No biliary dilatation. Pancreas: No main duct dilatation or focal pancreatic mass lesion. There is peripancreatic edema around the head of the pancreas, tracking into the pancreatico duodenal groove  and along the transverse segment of the duodenum. Edema in the anterior pararenal space noted along the tail of pancreas extending laterally on towards the left paracolic gutter. No evidence for enhancing pancreatic mass lesion. Spleen:  No splenomegaly. No focal mass lesion. Adrenals/Urinary Tract: No adrenal nodule or mass. Kidneys unremarkable. Stomach/Bowel: Stomach is unremarkable. No gastric wall thickening. No evidence of outlet obstruction. As noted above, there is edema surrounding the descending and transverse segments of the duodenum without appreciable duodenal wall thickening. No small bowel or colonic dilatation within the visualized abdomen. Vascular/Lymphatic: No abdominal aortic aneurysm. There is no gastrohepatic or hepatoduodenal ligament lymphadenopathy. No retroperitoneal or mesenteric lymphadenopathy. Other:  No substantial intraperitoneal free fluid. Musculoskeletal: No focal suspicious marrow enhancement within the visualized bony anatomy. IMPRESSION: 1. Cholelithiasis. No biliary dilatation or choledocholithiasis. 2. Peripancreatic edema extending along the descending and transverse segments of the duodenum. No appreciable duodenal wall thickening. Imaging features most suggestive of pancreatitis. No evidence for pancreatic necrosis or pseudocyst. Duodenitis considered less likely. Electronically Signed   By: Kennith CenterEric  Mansell M.D.   On: 05/29/2021 05:25   MR ABDOMEN WITH MRCP W CONTRAST  Result Date: 05/29/2021 CLINICAL DATA:  Right upper quadrant pain. EXAM: MRI ABDOMEN WITH CONTRAST (WITH MRCP) TECHNIQUE: Multiplanar multisequence MR imaging of the abdomen was performed following the administration of intravenous contrast. Heavily T2-weighted images of the biliary and pancreatic ducts were obtained, and three-dimensional MRCP images were rendered by post processing. CONTRAST:  10mL GADAVIST GADOBUTROL 1 MMOL/ML IV SOLN COMPARISON:  CT scan earlier same day FINDINGS: Lower chest:  Unremarkable. Hepatobiliary: No suspicious focal abnormality within the liver parenchyma. Multiple gallstones evident measuring up to approximately 6 mm maximum size. No gallbladder wall thickening. No biliary dilatation. Pancreas: No main duct dilatation or focal pancreatic mass lesion. There is peripancreatic edema around the head of the pancreas, tracking into the pancreatico duodenal groove and along the transverse segment of the duodenum. Edema in the anterior pararenal space noted along the tail of pancreas extending laterally on towards the left paracolic gutter. No evidence for enhancing pancreatic mass lesion. Spleen:  No splenomegaly. No focal mass lesion. Adrenals/Urinary Tract: No adrenal nodule or mass. Kidneys unremarkable. Stomach/Bowel: Stomach is unremarkable. No gastric wall thickening. No evidence of outlet obstruction. As noted above, there is edema surrounding the descending and transverse segments of the duodenum without appreciable duodenal wall thickening. No small bowel or colonic dilatation within the visualized abdomen. Vascular/Lymphatic: No abdominal aortic aneurysm. There is no gastrohepatic or hepatoduodenal ligament lymphadenopathy. No retroperitoneal or mesenteric lymphadenopathy. Other:  No substantial intraperitoneal free fluid. Musculoskeletal: No focal suspicious marrow enhancement within the visualized bony anatomy. IMPRESSION: 1. Cholelithiasis. No biliary dilatation or choledocholithiasis. 2. Peripancreatic edema extending along the descending and transverse segments of the duodenum. No appreciable duodenal wall thickening. Imaging features most suggestive of pancreatitis. No evidence for pancreatic necrosis or pseudocyst. Duodenitis considered less likely. Electronically Signed   By: Kennith CenterEric  Mansell M.D.   On: 05/29/2021 05:25   US Abdomen Limited RUQ (LIVER/GB)  Result Date: 05/28/2021 CLINICAL DATA:  Right upper quadrant pain. EXAM: ULTRASOUND ABDOMEN LIMITED RIGHT UPPER  QUADRANT COMPARISON:  None. FINDINGS: Gallbladder: Multiple gallstones measuring up to 0.8 cm. No gallbladder wall thickening. No sonographic Murphy sign noted by sonographer. Common bile duct: Diameter: 0.7 cm, borderline dilated Liver: No focal lesion identified. Within normal limits in parenchymal echogenicity. Portal vein is patent on color Doppler imaging with normal direction of blood flow towards the liver. Other: None. IMPRESSION: 1.  Cholelithiasis without evidence of acute cholecystitis. 2. Borderline dilation of the common bile duct. If there is concern for biliary duct obstruction consider MRI/MRCP. Electronically Signed   By: Emmaline KluverNancy  Ballantyne M.D.   On: 05/28/2021 14:18     LOS: 1 day   Lanae Boastamesh Kerryn Tennant, MD [facility]  05/29/2021, 11:55 AM

## 2021-05-29 NOTE — Assessment & Plan Note (Signed)
See above

## 2021-05-29 NOTE — Assessment & Plan Note (Addendum)
A1c 5.7%. -Lifestyle change -Continue home metformin

## 2021-05-29 NOTE — Assessment & Plan Note (Addendum)
Stable  Continue home medication  

## 2021-05-29 NOTE — Assessment & Plan Note (Addendum)
Resolved

## 2021-05-30 ENCOUNTER — Inpatient Hospital Stay (HOSPITAL_COMMUNITY): Payer: BC Managed Care – PPO | Admitting: Anesthesiology

## 2021-05-30 ENCOUNTER — Encounter (HOSPITAL_COMMUNITY): Payer: Self-pay | Admitting: Family Medicine

## 2021-05-30 ENCOUNTER — Encounter (HOSPITAL_COMMUNITY)
Admission: EM | Disposition: A | Discharge status: Home / Self Care | Payer: Self-pay | Source: Home / Self Care | Attending: Student

## 2021-05-30 ENCOUNTER — Other Ambulatory Visit: Payer: Self-pay

## 2021-05-30 ENCOUNTER — Inpatient Hospital Stay (HOSPITAL_COMMUNITY): Payer: BC Managed Care – PPO

## 2021-05-30 DIAGNOSIS — R7303 Prediabetes: Secondary | ICD-10-CM

## 2021-05-30 DIAGNOSIS — F331 Major depressive disorder, recurrent, moderate: Secondary | ICD-10-CM

## 2021-05-30 DIAGNOSIS — K851 Biliary acute pancreatitis without necrosis or infection: Secondary | ICD-10-CM | POA: Diagnosis not present

## 2021-05-30 DIAGNOSIS — E876 Hypokalemia: Secondary | ICD-10-CM

## 2021-05-30 DIAGNOSIS — R748 Abnormal levels of other serum enzymes: Secondary | ICD-10-CM | POA: Diagnosis present

## 2021-05-30 DIAGNOSIS — K8020 Calculus of gallbladder without cholecystitis without obstruction: Secondary | ICD-10-CM | POA: Diagnosis not present

## 2021-05-30 DIAGNOSIS — D509 Iron deficiency anemia, unspecified: Secondary | ICD-10-CM | POA: Diagnosis present

## 2021-05-30 DIAGNOSIS — D649 Anemia, unspecified: Secondary | ICD-10-CM

## 2021-05-30 DIAGNOSIS — J4520 Mild intermittent asthma, uncomplicated: Secondary | ICD-10-CM | POA: Diagnosis not present

## 2021-05-30 HISTORY — PX: CHOLECYSTECTOMY: SHX55

## 2021-05-30 LAB — CBC
HCT: 32.7 % — ABNORMAL LOW (ref 36.0–46.0)
Hemoglobin: 10.4 g/dL — ABNORMAL LOW (ref 12.0–15.0)
MCH: 28.7 pg (ref 26.0–34.0)
MCHC: 31.8 g/dL (ref 30.0–36.0)
MCV: 90.1 fL (ref 80.0–100.0)
Platelets: 255 10*3/uL (ref 150–400)
RBC: 3.63 MIL/uL — ABNORMAL LOW (ref 3.87–5.11)
RDW: 13.5 % (ref 11.5–15.5)
WBC: 8.3 10*3/uL (ref 4.0–10.5)
nRBC: 0 % (ref 0.0–0.2)

## 2021-05-30 LAB — COMPREHENSIVE METABOLIC PANEL
ALT: 210 U/L — ABNORMAL HIGH (ref 0–44)
AST: 53 U/L — ABNORMAL HIGH (ref 15–41)
Albumin: 3.2 g/dL — ABNORMAL LOW (ref 3.5–5.0)
Alkaline Phosphatase: 73 U/L (ref 38–126)
Anion gap: 5 (ref 5–15)
BUN: 6 mg/dL (ref 6–20)
CO2: 27 mmol/L (ref 22–32)
Calcium: 8.3 mg/dL — ABNORMAL LOW (ref 8.9–10.3)
Chloride: 103 mmol/L (ref 98–111)
Creatinine, Ser: 0.72 mg/dL (ref 0.44–1.00)
GFR, Estimated: >60 mL/min (ref >60)
Glucose, Bld: 107 mg/dL — ABNORMAL HIGH (ref 70–99)
Potassium: 3.7 mmol/L (ref 3.5–5.1)
Sodium: 135 mmol/L (ref 135–145)
Total Bilirubin: 0.7 mg/dL (ref 0.3–1.2)
Total Protein: 5.7 g/dL — ABNORMAL LOW (ref 6.5–8.1)

## 2021-05-30 LAB — TRIGLYCERIDES: Triglycerides: 81 mg/dL (ref <150)

## 2021-05-30 LAB — LIPASE, BLOOD: Lipase: 86 U/L — ABNORMAL HIGH (ref 11–51)

## 2021-05-30 SURGERY — LAPAROSCOPIC CHOLECYSTECTOMY WITH INTRAOPERATIVE CHOLANGIOGRAM
Anesthesia: General | Site: Abdomen

## 2021-05-30 MED ORDER — ONDANSETRON HCL 4 MG/2ML IJ SOLN
INTRAMUSCULAR | Status: AC
  Filled 2021-05-30: qty 2

## 2021-05-30 MED ORDER — LIDOCAINE 2% (20 MG/ML) 5 ML SYRINGE
INTRAMUSCULAR | Status: DC | PRN
  Administered 2021-05-30: 80 mg via INTRAVENOUS

## 2021-05-30 MED ORDER — SUGAMMADEX SODIUM 500 MG/5ML IV SOLN
INTRAVENOUS | Status: DC | PRN
  Administered 2021-05-30: 300 mg via INTRAVENOUS

## 2021-05-30 MED ORDER — ROCURONIUM BROMIDE 10 MG/ML (PF) SYRINGE
PREFILLED_SYRINGE | INTRAVENOUS | Status: AC
  Filled 2021-05-30: qty 10

## 2021-05-30 MED ORDER — DEXMEDETOMIDINE (PRECEDEX) IN NS 20 MCG/5ML (4 MCG/ML) IV SYRINGE
PREFILLED_SYRINGE | INTRAVENOUS | Status: DC | PRN
Start: 2021-05-30 — End: 2021-05-30
  Administered 2021-05-30: 8 ug via INTRAVENOUS
  Administered 2021-05-30: 4 ug via INTRAVENOUS
  Administered 2021-05-30: 8 ug via INTRAVENOUS

## 2021-05-30 MED ORDER — BUPIVACAINE-EPINEPHRINE (PF) 0.25% -1:200000 IJ SOLN
INTRAMUSCULAR | Status: AC
  Filled 2021-05-30: qty 30

## 2021-05-30 MED ORDER — DEXAMETHASONE SODIUM PHOSPHATE 10 MG/ML IJ SOLN
INTRAMUSCULAR | Status: DC | PRN
  Administered 2021-05-30: 10 mg via INTRAVENOUS

## 2021-05-30 MED ORDER — DEXMEDETOMIDINE (PRECEDEX) IN NS 20 MCG/5ML (4 MCG/ML) IV SYRINGE
PREFILLED_SYRINGE | INTRAVENOUS | Status: AC
  Filled 2021-05-30: qty 10

## 2021-05-30 MED ORDER — ONDANSETRON HCL 4 MG/2ML IJ SOLN
INTRAMUSCULAR | Status: DC | PRN
  Administered 2021-05-30: 4 mg via INTRAVENOUS

## 2021-05-30 MED ORDER — 0.9 % SODIUM CHLORIDE (POUR BTL) OPTIME
TOPICAL | Status: DC | PRN
  Administered 2021-05-30: 1000 mL

## 2021-05-30 MED ORDER — FENTANYL CITRATE PF 50 MCG/ML IJ SOSY
PREFILLED_SYRINGE | INTRAMUSCULAR | Status: AC
  Filled 2021-05-30: qty 2

## 2021-05-30 MED ORDER — LACTATED RINGERS IR SOLN
Status: DC | PRN
Start: 2021-05-30 — End: 2021-05-30
  Administered 2021-05-30: 1000 mL

## 2021-05-30 MED ORDER — ACETAMINOPHEN 10 MG/ML IV SOLN
INTRAVENOUS | Status: AC
  Filled 2021-05-30: qty 100

## 2021-05-30 MED ORDER — MIDAZOLAM HCL 5 MG/5ML IJ SOLN
INTRAMUSCULAR | Status: DC | PRN
  Administered 2021-05-30: 2 mg via INTRAVENOUS

## 2021-05-30 MED ORDER — METHOCARBAMOL 500 MG PO TABS
500.0000 mg | ORAL_TABLET | Freq: Four times a day (QID) | ORAL | Status: DC | PRN
  Administered 2021-05-30: 500 mg via ORAL
  Filled 2021-05-30: qty 1

## 2021-05-30 MED ORDER — FENTANYL CITRATE (PF) 100 MCG/2ML IJ SOLN
INTRAMUSCULAR | Status: DC | PRN
  Administered 2021-05-30: 50 ug via INTRAVENOUS
  Administered 2021-05-30: 100 ug via INTRAVENOUS
  Administered 2021-05-30 (×2): 50 ug via INTRAVENOUS

## 2021-05-30 MED ORDER — FENTANYL CITRATE PF 50 MCG/ML IJ SOSY
25.0000 ug | PREFILLED_SYRINGE | INTRAMUSCULAR | Status: DC | PRN
  Administered 2021-05-30 (×2): 50 ug via INTRAVENOUS

## 2021-05-30 MED ORDER — PROPOFOL 10 MG/ML IV BOLUS
INTRAVENOUS | Status: AC
  Filled 2021-05-30: qty 20

## 2021-05-30 MED ORDER — OXYCODONE HCL 5 MG PO TABS
5.0000 mg | ORAL_TABLET | ORAL | Status: DC | PRN
  Administered 2021-05-30 – 2021-05-31 (×3): 10 mg via ORAL
  Filled 2021-05-30 (×3): qty 2

## 2021-05-30 MED ORDER — ACETAMINOPHEN 325 MG PO TABS
650.0000 mg | ORAL_TABLET | Freq: Four times a day (QID) | ORAL | Status: DC | PRN
  Filled 2021-05-30: qty 2

## 2021-05-30 MED ORDER — FENTANYL CITRATE (PF) 250 MCG/5ML IJ SOLN
INTRAMUSCULAR | Status: AC
  Filled 2021-05-30: qty 5

## 2021-05-30 MED ORDER — AMISULPRIDE (ANTIEMETIC) 5 MG/2ML IV SOLN: 10.0000 mg | Freq: Once | INTRAVENOUS | Status: DC | PRN

## 2021-05-30 MED ORDER — SUGAMMADEX SODIUM 500 MG/5ML IV SOLN
INTRAVENOUS | Status: AC
  Filled 2021-05-30: qty 5

## 2021-05-30 MED ORDER — BUPIVACAINE-EPINEPHRINE 0.25% -1:200000 IJ SOLN
INTRAMUSCULAR | Status: DC | PRN
Start: 2021-05-30 — End: 2021-05-30
  Administered 2021-05-30: 20 mL

## 2021-05-30 MED ORDER — SODIUM CHLORIDE (PF) 0.9 % IJ SOLN
INTRAMUSCULAR | Status: DC | PRN
  Administered 2021-05-30: 10 mL

## 2021-05-30 MED ORDER — MIDAZOLAM HCL 2 MG/2ML IJ SOLN
INTRAMUSCULAR | Status: AC
  Filled 2021-05-30: qty 2

## 2021-05-30 MED ORDER — MORPHINE SULFATE (PF) 2 MG/ML IV SOLN
2.0000 mg | INTRAVENOUS | Status: DC | PRN
Start: 2021-05-30 — End: 2021-05-31

## 2021-05-30 MED ORDER — PROPOFOL 10 MG/ML IV BOLUS
INTRAVENOUS | Status: DC | PRN
  Administered 2021-05-30: 200 mg via INTRAVENOUS

## 2021-05-30 MED ORDER — ROCURONIUM BROMIDE 10 MG/ML (PF) SYRINGE
PREFILLED_SYRINGE | INTRAVENOUS | Status: DC | PRN
  Administered 2021-05-30: 60 mg via INTRAVENOUS

## 2021-05-30 MED ORDER — ACETAMINOPHEN 10 MG/ML IV SOLN
1000.0000 mg | Freq: Once | INTRAVENOUS | Status: DC | PRN
  Administered 2021-05-30: 1000 mg via INTRAVENOUS

## 2021-05-30 MED ORDER — CHLORHEXIDINE GLUCONATE CLOTH 2 % EX PADS
6.0000 | MEDICATED_PAD | Freq: Once | CUTANEOUS | Status: AC
  Administered 2021-05-30: 6 via TOPICAL

## 2021-05-30 SURGICAL SUPPLY — 36 items
ADH SKN CLS APL DERMABOND .7 (GAUZE/BANDAGES/DRESSINGS) ×1
APL PRP STRL LF DISP 70% ISPRP (MISCELLANEOUS) ×1
APPLIER CLIP 5 13 M/L LIGAMAX5 (MISCELLANEOUS) ×2
APR CLP MED LRG 5 ANG JAW (MISCELLANEOUS) ×1
BAG COUNTER SPONGE SURGICOUNT (BAG) IMPLANT
BAG SPEC RTRVL LRG 6X4 10 (ENDOMECHANICALS) ×1
BAG SPNG CNTER NS LX DISP (BAG)
CABLE HIGH FREQUENCY MONO STRZ (ELECTRODE) ×2 IMPLANT
CATH REDDICK CHOLANGI 4FR 50CM (CATHETERS) ×2 IMPLANT
CHLORAPREP W/TINT 26 (MISCELLANEOUS) ×2 IMPLANT
CLIP APPLIE 5 13 M/L LIGAMAX5 (MISCELLANEOUS) ×1 IMPLANT
COVER MAYO STAND STRL (DRAPES) ×2 IMPLANT
DERMABOND ADVANCED (GAUZE/BANDAGES/DRESSINGS) ×1
DERMABOND ADVANCED .7 DNX12 (GAUZE/BANDAGES/DRESSINGS) ×1 IMPLANT
DRAPE C-ARM 42X120 X-RAY (DRAPES) ×2 IMPLANT
ELECT REM PT RETURN 15FT ADLT (MISCELLANEOUS) ×2 IMPLANT
GLOVE SURG ENC MOIS LTX SZ7.5 (GLOVE) ×2 IMPLANT
GOWN STRL REUS W/TWL XL LVL3 (GOWN DISPOSABLE) ×6 IMPLANT
HEMOSTAT SURGICEL 4X8 (HEMOSTASIS) IMPLANT
IRRIG SUCT STRYKERFLOW 2 WTIP (MISCELLANEOUS) ×2
IRRIGATION SUCT STRKRFLW 2 WTP (MISCELLANEOUS) ×1 IMPLANT
IV CATH 14GX2 1/4 (CATHETERS) ×2 IMPLANT
KIT BASIN OR (CUSTOM PROCEDURE TRAY) ×2 IMPLANT
KIT TURNOVER KIT A (KITS) IMPLANT
PENCIL SMOKE EVACUATOR (MISCELLANEOUS) IMPLANT
POUCH SPECIMEN RETRIEVAL 10MM (ENDOMECHANICALS) ×2 IMPLANT
SCISSORS LAP 5X35 DISP (ENDOMECHANICALS) ×2 IMPLANT
SET TUBE SMOKE EVAC HIGH FLOW (TUBING) ×2 IMPLANT
SLEEVE XCEL OPT CAN 5 100 (ENDOMECHANICALS) ×4 IMPLANT
SPIKE FLUID TRANSFER (MISCELLANEOUS) ×2 IMPLANT
SUT MNCRL AB 4-0 PS2 18 (SUTURE) ×2 IMPLANT
TOWEL OR 17X26 10 PK STRL BLUE (TOWEL DISPOSABLE) ×2 IMPLANT
TOWEL OR NON WOVEN STRL DISP B (DISPOSABLE) ×2 IMPLANT
TRAY LAPAROSCOPIC (CUSTOM PROCEDURE TRAY) ×2 IMPLANT
TROCAR BLADELESS OPT 5 100 (ENDOMECHANICALS) ×2 IMPLANT
TROCAR XCEL BLUNT TIP 100MML (ENDOMECHANICALS) ×2 IMPLANT

## 2021-05-30 NOTE — Anesthesia Postprocedure Evaluation (Signed)
Anesthesia Post Note ? ?Patient: Yolanda Tyler ? ?Procedure(s) Performed: LAPAROSCOPIC CHOLECYSTECTOMY WITH INTRAOPERATIVE CHOLANGIOGRAM (Abdomen) ? ?  ? ?Patient location during evaluation: PACU ?Anesthesia Type: General ?Level of consciousness: awake and alert ?Pain management: pain level controlled ?Vital Signs Assessment: post-procedure vital signs reviewed and stable ?Respiratory status: spontaneous breathing, nonlabored ventilation, respiratory function stable and patient connected to nasal cannula oxygen ?Cardiovascular status: blood pressure returned to baseline and stable ?Postop Assessment: no apparent nausea or vomiting ?Anesthetic complications: no ? ? ?No notable events documented. ? ?Last Vitals:  ?Vitals:  ? 05/30/21 1428 05/30/21 1508  ?BP: (!) 172/93   ?Pulse: 90   ?Resp: 19   ?Temp: 36.6 ?C   ?SpO2: (!) 85% 94%  ?  ?Last Pain:  ?Vitals:  ? 05/30/21 1842  ?TempSrc:   ?PainSc: 4   ? ? ?  ?  ?  ?  ?  ?  ? ?Yolanda Tyler ? ? ? ? ?

## 2021-05-30 NOTE — Anesthesia Preprocedure Evaluation (Addendum)
Anesthesia Evaluation  ?Patient identified by MRN, date of birth, ID band ?Patient awake ? ? ? ?Reviewed: ?Allergy & Precautions, NPO status , Patient's Chart, lab work & pertinent test results ? ?Airway ?Mallampati: III ? ?TM Distance: >3 FB ?Neck ROM: Full ? ? ? Dental ?no notable dental hx. ? ?  ?Pulmonary ?asthma ,  ?  ?Pulmonary exam normal ? ? ? ? ? ? ? Cardiovascular ? ?Rhythm:Regular Rate:Normal ? ? ?  ?Neuro/Psych ?Anxiety Depression negative neurological ROS ?   ? GI/Hepatic ?Neg liver ROS, Biliary  Pancreatitis  ?  ?Endo/Other  ?Morbid obesity ? Renal/GU ?negative Renal ROS  ?negative genitourinary ?  ?Musculoskeletal ? ? Abdominal ?Normal abdominal exam  (+)   ?Peds ? Hematology ?  ?Anesthesia Other Findings ? ? Reproductive/Obstetrics ? ?  ? ? ? ? ? ? ? ? ? ? ? ? ? ?  ?  ? ? ? ? ? ? ? ?Anesthesia Physical ?Anesthesia Plan ? ?ASA: 3 ? ?Anesthesia Plan: General  ? ?Post-op Pain Management: Ofirmev IV (intra-op)*  ? ?Induction: Intravenous ? ?PONV Risk Score and Plan: 3 and Ondansetron, Dexamethasone, Midazolam and Treatment may vary due to age or medical condition ? ?Airway Management Planned: Mask and Oral ETT ? ?Additional Equipment: None ? ?Intra-op Plan:  ? ?Post-operative Plan: Extubation in OR ? ?Informed Consent: I have reviewed the patients History and Physical, chart, labs and discussed the procedure including the risks, benefits and alternatives for the proposed anesthesia with the patient or authorized representative who has indicated his/her understanding and acceptance.  ? ? ? ?Dental advisory given ? ?Plan Discussed with: CRNA ? ?Anesthesia Plan Comments:   ? ? ? ? ? ? ?Anesthesia Quick Evaluation ? ?

## 2021-05-30 NOTE — Transfer of Care (Signed)
Immediate Anesthesia Transfer of Care Note ? ?Patient: Yolanda Tyler ? ?Procedure(s) Performed: Procedure(s): ?LAPAROSCOPIC CHOLECYSTECTOMY WITH INTRAOPERATIVE CHOLANGIOGRAM (N/A) ? ?Patient Location: PACU ? ?Anesthesia Type:General ? ?Level of Consciousness:  sedated, patient cooperative and responds to stimulation ? ?Airway & Oxygen Therapy:Patient Spontanous Breathing and Patient connected to face mask oxgen ? ?Post-op Assessment:  Report given to PACU RN and Post -op Vital signs reviewed and stable ? ?Post vital signs:  Reviewed and stable ? ?Last Vitals:  ?Vitals:  ? 05/29/21 2201 05/30/21 1107  ?BP: 129/72 (!) 163/82  ?Pulse: 81 79  ?Resp: 14 18  ?Temp: 36.9 ?C 36.8 ?C  ?SpO2: 98% 100%  ? ? ?Complications: No apparent anesthesia complications ? ?

## 2021-05-30 NOTE — Discharge Instructions (Signed)
[facility]  Please arrive at least 30 min before your appointment to complete your check in paperwork.  If you are unable to arrive 30 min prior to your appointment time we may have to cancel or reschedule you. LAPAROSCOPIC SURGERY: POST OP INSTRUCTIONS Always review your discharge instruction sheet given to you by the facility where your surgery was performed. IF YOU HAVE DISABILITY OR FAMILY LEAVE FORMS, YOU MUST BRING THEM TO THE OFFICE FOR PROCESSING.   DO NOT GIVE THEM TO YOUR DOCTOR.  PAIN CONTROL  First take acetaminophen (Tylenol) AND/or ibuprofen (Advil) to control your pain after surgery.  Follow directions on package.  Taking acetaminophen (Tylenol) and/or ibuprofen (Advil) regularly after surgery will help to control your pain and lower the amount of prescription pain medication you may need.  You should not take more than 4,000 mg (4 grams) of acetaminophen (Tylenol) in 24 hours.  You should not take ibuprofen (Advil), aleve, motrin, naprosyn or other NSAIDS if you have a history of stomach ulcers or chronic kidney disease.  A prescription for pain medication may be given to you upon discharge.  Take your pain medication as prescribed, if you still have uncontrolled pain after taking acetaminophen (Tylenol) or ibuprofen (Advil). Use ice packs to help control pain. If you need a refill on your pain medication, please contact your pharmacy.  They will contact our office to request authorization. Prescriptions will not be filled after 5pm or on week-ends.  HOME MEDICATIONS Take your usually prescribed medications unless otherwise directed.  DIET You should follow a light diet the first few days after arrival home.  Be sure to include lots of fluids daily. Avoid fatty, fried foods.   CONSTIPATION It is common to experience some constipation after surgery and if you are taking pain medication.  Increasing fluid intake and taking a stool softener (such as Colace)  will usually help or prevent this problem from occurring.  A mild laxative (Milk of Magnesia or Miralax) should be taken according to package instructions if there are no bowel movements after 48 hours.  WOUND/INCISION CARE Most patients will experience some swelling and bruising in the area of the incisions.  Ice packs will help.  Swelling and bruising can take several days to resolve.  Unless discharge instructions indicate otherwise, follow guidelines below  STERI-STRIPS - you may remove your outer bandages 48 hours after surgery, and you may shower at that time.  You have steri-strips (small skin tapes) in place directly over the incision.  These strips should be left on the skin for 7-10 days.   DERMABOND/SKIN GLUE - you may shower in 24 hours.  The glue will flake off over the next 2-3 weeks. Any sutures or staples will be removed at the office during your follow-up visit.  ACTIVITIES You may resume regular (light) daily activities beginning the next day--such as daily self-care, walking, climbing stairs--gradually increasing activities as tolerated.  You may have sexual intercourse when it is comfortable.  Refrain from any heavy lifting or straining until approved by your doctor. You may drive when you are no longer taking prescription pain medication, you can comfortably wear a seatbelt, and you can safely maneuver your car and apply brakes.  FOLLOW-UP You should see your doctor in the office for a follow-up appointment approximately 2-3 weeks after your surgery.  You should have been given your post-op/follow-up appointment when your surgery was scheduled.  If you did not receive a post-op/follow-up appointment, make sure   that you call for this appointment within a day or two after you arrive home to insure a convenient appointment time.  OTHER INSTRUCTIONS  WHEN TO CALL YOUR DOCTOR: Fever over 101.0 Inability to urinate Continued bleeding from incision. Increased pain, redness, or  drainage from the incision. Increasing abdominal pain  The clinic staff is available to answer your questions during regular business hours.  Please don't hesitate to call and ask to speak to one of the nurses for clinical concerns.  If you have a medical emergency, go to the nearest emergency room or call 911.  A surgeon from [facility] is always on call at the hospital. [address], [address], [place], [place]  [address] ? [address] [address], [place], [place]   [address] [phone] ? [phone] ? FAX [phone]   

## 2021-05-30 NOTE — Op Note (Signed)
05/28/2021 - 05/30/2021 ? ?1:23 PM ? ?PATIENT:  Yolanda Tyler  36 y.o. female ? ?PRE-OPERATIVE DIAGNOSIS:  Biliary Pancreatitis ? ?POST-OPERATIVE DIAGNOSIS:  Biliary Pancreatitis ? ?PROCEDURE:  Procedure(s): ?LAPAROSCOPIC CHOLECYSTECTOMY WITH INTRAOPERATIVE CHOLANGIOGRAM (N/A) ? ?SURGEON:  Surgeon(s) and Role: ?   Griselda Miner, MD - Primary ? ?PHYSICIAN ASSISTANT:  ? ?ASSISTANTS: none  ? ?ANESTHESIA:   local and general ? ?EBL:  minimal  ? ?BLOOD ADMINISTERED:none ? ?DRAINS: none  ? ?LOCAL MEDICATIONS USED:  MARCAINE    ? ?SPECIMEN:  Source of Specimen:  gallbladder ? ?DISPOSITION OF SPECIMEN:  PATHOLOGY ? ?COUNTS:  YES ? ?TOURNIQUET:  * No tourniquets in log * ? ?DICTATION: .Dragon Dictation ? ? ? ?Procedure: After informed consent was obtained the patient was brought to the operating room and placed in the supine position on the operating room table. After adequate induction of general anesthesia the patient's abdomen was prepped with ChloraPrep allowed to dry and draped in usual sterile manner. An appropriate timeout was performed. The area below the umbilicus was infiltrated with quarter percent  Marcaine. A small incision was made with a 15 blade knife. The incision was carried down through the subcutaneous tissue bluntly with a hemostat and [employer] retractors. The linea alba was identified. The linea alba was incised with a 15 blade knife and each side was grasped with Coker clamps. The preperitoneal space was then probed with a hemostat until the peritoneum was opened and access was gained to the abdominal cavity. A 0 Vicryl pursestring stitch was placed in the fascia surrounding the opening. A Hassan cannula was then placed through the opening and anchored in place with the previously placed Vicryl purse string stitch. The abdomen was insufflated with carbon dioxide without difficulty. A laparoscope was inserted through the [facility] cannula in the right upper quadrant was inspected. Next the epigastric  region was infiltrated with ?% Marcaine. A small incision was made with a 15 blade knife. A 5 mm port was placed bluntly through this incision into the abdominal cavity under direct vision. Next 2 sites were chosen laterally on the right side of the abdomen for placement of 5 mm ports. Each of these areas was infiltrated with quarter percent Marcaine. Small stab incisions were made with a 15 blade knife. 5 mm ports were then placed bluntly through these incisions into the abdominal cavity under direct vision without difficulty. A blunt grasper was placed through the lateralmost 5 mm port and used to grasp the dome of the gallbladder and elevate it anteriorly and superiorly. Another blunt grasper was placed through the other 5 mm port and used to retract the body and neck of the gallbladder. A dissector was placed through the epigastric port and using the electrocautery the peritoneal reflection at the gallbladder neck was opened. Blunt dissection was then carried out in this area until the gallbladder neck-cystic duct junction was readily identified and a good window was created. A single clip was placed on the gallbladder neck. A small  ductotomy was made just below the clip with laparoscopic scissors. A 14-gauge Angiocath was then placed through the anterior abdominal wall under direct vision. A Reddick cholangiogram catheter was then placed through the Angiocath and flushed. The catheter was then placed in the cystic duct and anchored in place with a clip. A cholangiogram was obtained that showed no filling defects good emptying into the duodenum an adequate length on the cystic duct. The anchoring clip and catheters were then removed from the  patient. 3 clips were placed proximally on the cystic duct and the duct was divided between the 2 sets of clips. Posterior to this the cystic artery was identified and again dissected bluntly in a circumferential manner until a good window  was created. 2 clips were placed  proximally and one distally on the artery and the artery was divided between the 2 sets of clips. Next a laparoscopic hook cautery device was used to separate the gallbladder from the liver bed. Prior to completely detaching the gallbladder from the liver bed the liver bed was inspected and several small bleeding points were coagulated with the electrocautery until the area was completely hemostatic. The gallbladder was then detached the rest of it from the liver bed without difficulty. A laparoscopic bag was inserted through the hassan port. The laparoscope was moved to the epigastric port. The gallbladder was placed within the bag and the bag was sealed.  The bag with the gallbladder was then removed with the [facility] cannula through the infraumbilical port without difficulty. The fascial defect was then closed with the previously placed Vicryl pursestring stitch as well as with another figure-of-eight 0 Vicryl stitch. The liver bed was inspected again and found to be hemostatic. The abdomen was irrigated with copious amounts of saline until the effluent was clear. The ports were then removed under direct vision without difficulty and were found to be hemostatic. The gas was allowed to escape. The skin incisions were all closed with interrupted 4-0 Monocryl subcuticular stitches. Dermabond dressings were applied. The patient tolerated the procedure well. At the end of the case all needle sponge and instrument counts were correct. The patient was then awakened and taken to recovery in stable condition  ? ?PLAN OF CARE: Admit to inpatient  ? ?PATIENT DISPOSITION:  PACU - hemodynamically stable. ?  ?Delay start of Pharmacological VTE agent (>24hrs) due to surgical blood loss or risk of bleeding: no ? ?

## 2021-05-30 NOTE — Progress Notes (Signed)
Day of Surgery   Subjective/Chief Complaint: No complaints   Objective: Vital signs in last 24 hours: Temp:  [98 F (36.7 C)-98.4 F (36.9 C)] 98.4 F (36.9 C) (02/28 2201) Pulse Rate:  [79-81] 81 (02/28 2201) Resp:  [14-18] 14 (02/28 2201) BP: (117-129)/(56-72) 129/72 (02/28 2201) SpO2:  [97 %-98 %] 98 % (02/28 2201) Last BM Date : 05/28/21  Intake/Output from previous day: 02/28 0701 - 03/01 0700 In: 1214.4 [P.O.:120; I.V.:994.4; IV Piggyback:100] Out: -  Intake/Output this shift: No intake/output data recorded.  General appearance: alert and cooperative Resp: clear to auscultation bilaterally Cardio: regular rate and rhythm GI: soft, minimal tenderness  Lab Results:  Recent Labs    05/29/21 0516 05/30/21 0527  WBC 8.0 8.3  HGB 12.6 10.4*  HCT 39.4 32.7*  PLT 277 255   BMET Recent Labs    05/29/21 0516 05/30/21 0527  NA 136 135  K 3.3* 3.7  CL 104 103  CO2 26 27  GLUCOSE 105* 107*  BUN 7 6  CREATININE 0.69 0.72  CALCIUM 8.2* 8.3*   PT/INR Recent Labs    05/29/21 0516  LABPROT 13.0  INR 1.0   ABG No results for input(s): PHART, HCO3 in the last 72 hours.  Invalid input(s): PCO2, PO2  Studies/Results: CT ABDOMEN PELVIS W CONTRAST  Result Date: 05/28/2021 CLINICAL DATA:  Abdominal pain EXAM: CT ABDOMEN AND PELVIS WITH CONTRAST TECHNIQUE: Multidetector CT imaging of the abdomen and pelvis was performed using the standard protocol following bolus administration of intravenous contrast. RADIATION DOSE REDUCTION: This exam was performed according to the departmental dose-optimization program which includes automated exposure control, adjustment of the mA and/or kV according to patient size and/or use of iterative reconstruction technique. CONTRAST:  OMNIPAQUE IOHEXOL 300 MG/ML  SOLN COMPARISON:  Ultrasound abdomen done earlier today FINDINGS: Lower chest: Unremarkable. Hepatobiliary: Liver measures 19.2 cm in length. There is fatty infiltration.  Gallbladder is distended. There is no wall thickening in gallbladder. There is no fluid around the gallbladder. There is no significant dilation of bile ducts. Pancreas: There is peripancreatic stranding. There are no loculated fluid collections in the or around the pancreas. There is no dilation of pancreatic duct. Spleen: Unremarkable. Adrenals/Urinary Tract: Adrenals are not enlarged. There is no hydronephrosis. There are no renal or ureteral stones. Urinary bladder is unremarkable. Stomach/Bowel: Stomach is unremarkable. Small bowel loops are not dilated. Appendix is not seen. There is no focal pericecal inflammation. There is no significant wall thickening in colon. Scattered diverticula are seen in the left colon without evidence of focal acute diverticulitis. Vascular/Lymphatic: Unremarkable. Reproductive: There are small follicles in the ovaries. Uterus is to the right of midline. Trace amount of free fluid is seen in the cul-de-sac. Other: There is no signal ascites or pneumoperitoneum. Musculoskeletal: Unremarkable. IMPRESSION: There is peripancreatic edema suggesting acute pancreatitis. There is no evidence of pancreatic necrosis. There are no loculated fluid collections in or around the pancreas. There is no evidence of intestinal obstruction or pneumoperitoneum. There is no hydronephrosis. Enlarged fatty liver. Trace amount of free fluid in cul-de-sac and pelvis may suggest physiological rupture of ovarian follicle. Diverticulosis of colon without signs of focal diverticulitis. Electronically Signed   By: Ernie Avena M.D.   On: 05/28/2021 16:48   MR 3D Recon At Scanner  Result Date: 05/29/2021 CLINICAL DATA:  Right upper quadrant pain. EXAM: MRI ABDOMEN WITH CONTRAST (WITH MRCP) TECHNIQUE: Multiplanar multisequence MR imaging of the abdomen was performed following the administration of intravenous  contrast. Heavily T2-weighted images of the biliary and pancreatic ducts were obtained, and  three-dimensional MRCP images were rendered by post processing. CONTRAST:  41mL GADAVIST GADOBUTROL 1 MMOL/ML IV SOLN COMPARISON:  CT scan earlier same day FINDINGS: Lower chest: Unremarkable. Hepatobiliary: No suspicious focal abnormality within the liver parenchyma. Multiple gallstones evident measuring up to approximately 6 mm maximum size. No gallbladder wall thickening. No biliary dilatation. Pancreas: No main duct dilatation or focal pancreatic mass lesion. There is peripancreatic edema around the head of the pancreas, tracking into the pancreatico duodenal groove and along the transverse segment of the duodenum. Edema in the anterior pararenal space noted along the tail of pancreas extending laterally on towards the left paracolic gutter. No evidence for enhancing pancreatic mass lesion. Spleen:  No splenomegaly. No focal mass lesion. Adrenals/Urinary Tract: No adrenal nodule or mass. Kidneys unremarkable. Stomach/Bowel: Stomach is unremarkable. No gastric wall thickening. No evidence of outlet obstruction. As noted above, there is edema surrounding the descending and transverse segments of the duodenum without appreciable duodenal wall thickening. No small bowel or colonic dilatation within the visualized abdomen. Vascular/Lymphatic: No abdominal aortic aneurysm. There is no gastrohepatic or hepatoduodenal ligament lymphadenopathy. No retroperitoneal or mesenteric lymphadenopathy. Other:  No substantial intraperitoneal free fluid. Musculoskeletal: No focal suspicious marrow enhancement within the visualized bony anatomy. IMPRESSION: 1. Cholelithiasis. No biliary dilatation or choledocholithiasis. 2. Peripancreatic edema extending along the descending and transverse segments of the duodenum. No appreciable duodenal wall thickening. Imaging features most suggestive of pancreatitis. No evidence for pancreatic necrosis or pseudocyst. Duodenitis considered less likely. Electronically Signed   By: Kennith Center  M.D.   On: 05/29/2021 05:25   MR ABDOMEN WITH MRCP W CONTRAST  Result Date: 05/29/2021 CLINICAL DATA:  Right upper quadrant pain. EXAM: MRI ABDOMEN WITH CONTRAST (WITH MRCP) TECHNIQUE: Multiplanar multisequence MR imaging of the abdomen was performed following the administration of intravenous contrast. Heavily T2-weighted images of the biliary and pancreatic ducts were obtained, and three-dimensional MRCP images were rendered by post processing. CONTRAST:  30mL GADAVIST GADOBUTROL 1 MMOL/ML IV SOLN COMPARISON:  CT scan earlier same day FINDINGS: Lower chest: Unremarkable. Hepatobiliary: No suspicious focal abnormality within the liver parenchyma. Multiple gallstones evident measuring up to approximately 6 mm maximum size. No gallbladder wall thickening. No biliary dilatation. Pancreas: No main duct dilatation or focal pancreatic mass lesion. There is peripancreatic edema around the head of the pancreas, tracking into the pancreatico duodenal groove and along the transverse segment of the duodenum. Edema in the anterior pararenal space noted along the tail of pancreas extending laterally on towards the left paracolic gutter. No evidence for enhancing pancreatic mass lesion. Spleen:  No splenomegaly. No focal mass lesion. Adrenals/Urinary Tract: No adrenal nodule or mass. Kidneys unremarkable. Stomach/Bowel: Stomach is unremarkable. No gastric wall thickening. No evidence of outlet obstruction. As noted above, there is edema surrounding the descending and transverse segments of the duodenum without appreciable duodenal wall thickening. No small bowel or colonic dilatation within the visualized abdomen. Vascular/Lymphatic: No abdominal aortic aneurysm. There is no gastrohepatic or hepatoduodenal ligament lymphadenopathy. No retroperitoneal or mesenteric lymphadenopathy. Other:  No substantial intraperitoneal free fluid. Musculoskeletal: No focal suspicious marrow enhancement within the visualized bony anatomy.  IMPRESSION: 1. Cholelithiasis. No biliary dilatation or choledocholithiasis. 2. Peripancreatic edema extending along the descending and transverse segments of the duodenum. No appreciable duodenal wall thickening. Imaging features most suggestive of pancreatitis. No evidence for pancreatic necrosis or pseudocyst. Duodenitis considered less likely. Electronically Signed   By: Minerva Areola  Molli Posey M.D.   On: 05/29/2021 05:25   US Abdomen Limited RUQ (LIVER/GB)  Result Date: 05/28/2021 CLINICAL DATA:  Right upper quadrant pain. EXAM: ULTRASOUND ABDOMEN LIMITED RIGHT UPPER QUADRANT COMPARISON:  None. FINDINGS: Gallbladder: Multiple gallstones measuring up to 0.8 cm. No gallbladder wall thickening. No sonographic Murphy sign noted by sonographer. Common bile duct: Diameter: 0.7 cm, borderline dilated Liver: No focal lesion identified. Within normal limits in parenchymal echogenicity. Portal vein is patent on color Doppler imaging with normal direction of blood flow towards the liver. Other: None. IMPRESSION: 1.  Cholelithiasis without evidence of acute cholecystitis. 2. Borderline dilation of the common bile duct. If there is concern for biliary duct obstruction consider MRI/MRCP. Electronically Signed   By: Emmaline Kluver M.D.   On: 05/28/2021 14:18    Anti-infectives: Anti-infectives (From admission, onward)    Start     Dose/Rate Route Frequency Ordered Stop   05/29/21 0600  metroNIDAZOLE (FLAGYL) IVPB 500 mg        500 mg 100 mL/hr over 60 Minutes Intravenous Every 12 hours 05/28/21 1937     05/29/21 0300  ciprofloxacin (CIPRO) IVPB 400 mg        400 mg 200 mL/hr over 60 Minutes Intravenous Every 12 hours 05/28/21 1937     05/28/21 1515  ciprofloxacin (CIPRO) IVPB 400 mg       See Hyperspace for full Linked Orders Report.   400 mg 200 mL/hr over 60 Minutes Intravenous  Once 05/28/21 1504 05/28/21 1622   05/28/21 1515  metroNIDAZOLE (FLAGYL) IVPB 500 mg       See Hyperspace for full Linked Orders  Report.   500 mg 100 mL/hr over 60 Minutes Intravenous  Once 05/28/21 1504 05/28/21 1754       Assessment/Plan: s/p Procedure(s): LAPAROSCOPIC CHOLECYSTECTOMY WITH INTRAOPERATIVE CHOLANGIOGRAM (N/A) Plan for lap chole today.  Risks and benefits of the surgery as well as some of the technical aspects including cbd injury discussed with pt and she understands and wishes to proceed  LOS: 2 days    Chevis Pretty III 05/30/2021

## 2021-05-30 NOTE — H&P (View-Only) (Signed)
Day of Surgery   Subjective/Chief Complaint: No complaints   Objective: Vital signs in last 24 hours: Temp:  [98 F (36.7 C)-98.4 F (36.9 C)] 98.4 F (36.9 C) (02/28 2201) Pulse Rate:  [79-81] 81 (02/28 2201) Resp:  [14-18] 14 (02/28 2201) BP: (117-129)/(56-72) 129/72 (02/28 2201) SpO2:  [97 %-98 %] 98 % (02/28 2201) Last BM Date : 05/28/21  Intake/Output from previous day: 02/28 0701 - 03/01 0700 In: 1214.4 [P.O.:120; I.V.:994.4; IV Piggyback:100] Out: -  Intake/Output this shift: No intake/output data recorded.  General appearance: alert and cooperative Resp: clear to auscultation bilaterally Cardio: regular rate and rhythm GI: soft, minimal tenderness  Lab Results:  Recent Labs    05/29/21 0516 05/30/21 0527  WBC 8.0 8.3  HGB 12.6 10.4*  HCT 39.4 32.7*  PLT 277 255   BMET Recent Labs    05/29/21 0516 05/30/21 0527  NA 136 135  K 3.3* 3.7  CL 104 103  CO2 26 27  GLUCOSE 105* 107*  BUN 7 6  CREATININE 0.69 0.72  CALCIUM 8.2* 8.3*   PT/INR Recent Labs    05/29/21 0516  LABPROT 13.0  INR 1.0   ABG No results for input(s): PHART, HCO3 in the last 72 hours.  Invalid input(s): PCO2, PO2  Studies/Results: CT ABDOMEN PELVIS W CONTRAST  Result Date: 05/28/2021 CLINICAL DATA:  Abdominal pain EXAM: CT ABDOMEN AND PELVIS WITH CONTRAST TECHNIQUE: Multidetector CT imaging of the abdomen and pelvis was performed using the standard protocol following bolus administration of intravenous contrast. RADIATION DOSE REDUCTION: This exam was performed according to the departmental dose-optimization program which includes automated exposure control, adjustment of the mA and/or kV according to patient size and/or use of iterative reconstruction technique. CONTRAST:  OMNIPAQUE IOHEXOL 300 MG/ML  SOLN COMPARISON:  Ultrasound abdomen done earlier today FINDINGS: Lower chest: Unremarkable. Hepatobiliary: Liver measures 19.2 cm in length. There is fatty infiltration.  Gallbladder is distended. There is no wall thickening in gallbladder. There is no fluid around the gallbladder. There is no significant dilation of bile ducts. Pancreas: There is peripancreatic stranding. There are no loculated fluid collections in the or around the pancreas. There is no dilation of pancreatic duct. Spleen: Unremarkable. Adrenals/Urinary Tract: Adrenals are not enlarged. There is no hydronephrosis. There are no renal or ureteral stones. Urinary bladder is unremarkable. Stomach/Bowel: Stomach is unremarkable. Small bowel loops are not dilated. Appendix is not seen. There is no focal pericecal inflammation. There is no significant wall thickening in colon. Scattered diverticula are seen in the left colon without evidence of focal acute diverticulitis. Vascular/Lymphatic: Unremarkable. Reproductive: There are small follicles in the ovaries. Uterus is to the right of midline. Trace amount of free fluid is seen in the cul-de-sac. Other: There is no signal ascites or pneumoperitoneum. Musculoskeletal: Unremarkable. IMPRESSION: There is peripancreatic edema suggesting acute pancreatitis. There is no evidence of pancreatic necrosis. There are no loculated fluid collections in or around the pancreas. There is no evidence of intestinal obstruction or pneumoperitoneum. There is no hydronephrosis. Enlarged fatty liver. Trace amount of free fluid in cul-de-sac and pelvis may suggest physiological rupture of ovarian follicle. Diverticulosis of colon without signs of focal diverticulitis. Electronically Signed   By: Ernie Avena M.D.   On: 05/28/2021 16:48   MR 3D Recon At Scanner  Result Date: 05/29/2021 CLINICAL DATA:  Right upper quadrant pain. EXAM: MRI ABDOMEN WITH CONTRAST (WITH MRCP) TECHNIQUE: Multiplanar multisequence MR imaging of the abdomen was performed following the administration of intravenous  contrast. Heavily T2-weighted images of the biliary and pancreatic ducts were obtained, and  three-dimensional MRCP images were rendered by post processing. CONTRAST:  10mL GADAVIST GADOBUTROL 1 MMOL/ML IV SOLN COMPARISON:  CT scan earlier same day FINDINGS: Lower chest: Unremarkable. Hepatobiliary: No suspicious focal abnormality within the liver parenchyma. Multiple gallstones evident measuring up to approximately 6 mm maximum size. No gallbladder wall thickening. No biliary dilatation. Pancreas: No main duct dilatation or focal pancreatic mass lesion. There is peripancreatic edema around the head of the pancreas, tracking into the pancreatico duodenal groove and along the transverse segment of the duodenum. Edema in the anterior pararenal space noted along the tail of pancreas extending laterally on towards the left paracolic gutter. No evidence for enhancing pancreatic mass lesion. Spleen:  No splenomegaly. No focal mass lesion. Adrenals/Urinary Tract: No adrenal nodule or mass. Kidneys unremarkable. Stomach/Bowel: Stomach is unremarkable. No gastric wall thickening. No evidence of outlet obstruction. As noted above, there is edema surrounding the descending and transverse segments of the duodenum without appreciable duodenal wall thickening. No small bowel or colonic dilatation within the visualized abdomen. Vascular/Lymphatic: No abdominal aortic aneurysm. There is no gastrohepatic or hepatoduodenal ligament lymphadenopathy. No retroperitoneal or mesenteric lymphadenopathy. Other:  No substantial intraperitoneal free fluid. Musculoskeletal: No focal suspicious marrow enhancement within the visualized bony anatomy. IMPRESSION: 1. Cholelithiasis. No biliary dilatation or choledocholithiasis. 2. Peripancreatic edema extending along the descending and transverse segments of the duodenum. No appreciable duodenal wall thickening. Imaging features most suggestive of pancreatitis. No evidence for pancreatic necrosis or pseudocyst. Duodenitis considered less likely. Electronically Signed   By: Kennith CenterEric  Mansell  M.D.   On: 05/29/2021 05:25   MR ABDOMEN WITH MRCP W CONTRAST  Result Date: 05/29/2021 CLINICAL DATA:  Right upper quadrant pain. EXAM: MRI ABDOMEN WITH CONTRAST (WITH MRCP) TECHNIQUE: Multiplanar multisequence MR imaging of the abdomen was performed following the administration of intravenous contrast. Heavily T2-weighted images of the biliary and pancreatic ducts were obtained, and three-dimensional MRCP images were rendered by post processing. CONTRAST:  10mL GADAVIST GADOBUTROL 1 MMOL/ML IV SOLN COMPARISON:  CT scan earlier same day FINDINGS: Lower chest: Unremarkable. Hepatobiliary: No suspicious focal abnormality within the liver parenchyma. Multiple gallstones evident measuring up to approximately 6 mm maximum size. No gallbladder wall thickening. No biliary dilatation. Pancreas: No main duct dilatation or focal pancreatic mass lesion. There is peripancreatic edema around the head of the pancreas, tracking into the pancreatico duodenal groove and along the transverse segment of the duodenum. Edema in the anterior pararenal space noted along the tail of pancreas extending laterally on towards the left paracolic gutter. No evidence for enhancing pancreatic mass lesion. Spleen:  No splenomegaly. No focal mass lesion. Adrenals/Urinary Tract: No adrenal nodule or mass. Kidneys unremarkable. Stomach/Bowel: Stomach is unremarkable. No gastric wall thickening. No evidence of outlet obstruction. As noted above, there is edema surrounding the descending and transverse segments of the duodenum without appreciable duodenal wall thickening. No small bowel or colonic dilatation within the visualized abdomen. Vascular/Lymphatic: No abdominal aortic aneurysm. There is no gastrohepatic or hepatoduodenal ligament lymphadenopathy. No retroperitoneal or mesenteric lymphadenopathy. Other:  No substantial intraperitoneal free fluid. Musculoskeletal: No focal suspicious marrow enhancement within the visualized bony anatomy.  IMPRESSION: 1. Cholelithiasis. No biliary dilatation or choledocholithiasis. 2. Peripancreatic edema extending along the descending and transverse segments of the duodenum. No appreciable duodenal wall thickening. Imaging features most suggestive of pancreatitis. No evidence for pancreatic necrosis or pseudocyst. Duodenitis considered less likely. Electronically Signed   By: Minerva AreolaEric  Molli Posey M.D.   On: 05/29/2021 05:25   US Abdomen Limited RUQ (LIVER/GB)  Result Date: 05/28/2021 CLINICAL DATA:  Right upper quadrant pain. EXAM: ULTRASOUND ABDOMEN LIMITED RIGHT UPPER QUADRANT COMPARISON:  None. FINDINGS: Gallbladder: Multiple gallstones measuring up to 0.8 cm. No gallbladder wall thickening. No sonographic Murphy sign noted by sonographer. Common bile duct: Diameter: 0.7 cm, borderline dilated Liver: No focal lesion identified. Within normal limits in parenchymal echogenicity. Portal vein is patent on color Doppler imaging with normal direction of blood flow towards the liver. Other: None. IMPRESSION: 1.  Cholelithiasis without evidence of acute cholecystitis. 2. Borderline dilation of the common bile duct. If there is concern for biliary duct obstruction consider MRI/MRCP. Electronically Signed   By: Emmaline Kluver M.D.   On: 05/28/2021 14:18    Anti-infectives: Anti-infectives (From admission, onward)    Start     Dose/Rate Route Frequency Ordered Stop   05/29/21 0600  metroNIDAZOLE (FLAGYL) IVPB 500 mg        500 mg 100 mL/hr over 60 Minutes Intravenous Every 12 hours 05/28/21 1937     05/29/21 0300  ciprofloxacin (CIPRO) IVPB 400 mg        400 mg 200 mL/hr over 60 Minutes Intravenous Every 12 hours 05/28/21 1937     05/28/21 1515  ciprofloxacin (CIPRO) IVPB 400 mg       See Hyperspace for full Linked Orders Report.   400 mg 200 mL/hr over 60 Minutes Intravenous  Once 05/28/21 1504 05/28/21 1622   05/28/21 1515  metroNIDAZOLE (FLAGYL) IVPB 500 mg       See Hyperspace for full Linked Orders  Report.   500 mg 100 mL/hr over 60 Minutes Intravenous  Once 05/28/21 1504 05/28/21 1754       Assessment/Plan: s/p Procedure(s): LAPAROSCOPIC CHOLECYSTECTOMY WITH INTRAOPERATIVE CHOLANGIOGRAM (N/A) Plan for lap chole today.  Risks and benefits of the surgery as well as some of the technical aspects including cbd injury discussed with pt and she understands and wishes to proceed  LOS: 2 days    Chevis Pretty III 05/30/2021

## 2021-05-30 NOTE — Assessment & Plan Note (Addendum)
Recent Labs  ?  04/04/21 ?1013 05/28/21 ?1320 05/29/21 ?[id number] 05/30/21 ?[id number] 05/31/21 ?[id number]  ?HGB 12.3 12.8 12.6 10.4* 10.7*  ?Drop in Hgb likely dilutional.  Anemia panel consistent with iron deficiency.  Denies melena or hematochezia. ?-Received IV ferric gluconate 250 mg x 1 ?-Discharged on p.o. ferrous sulfate with bowel regimen ?-Recheck CBC at follow-up. ? ?

## 2021-05-30 NOTE — Assessment & Plan Note (Addendum)
Most likely passed biliary stone.  Resolving. ?-Recheck CMP at follow-up ?

## 2021-05-30 NOTE — Anesthesia Procedure Notes (Signed)
Procedure Name: Intubation ?Date/Time: 05/30/2021 12:43 PM ?Performed by: Lavina Hamman, CRNA ?Pre-anesthesia Checklist: Patient identified, Emergency Drugs available, Suction available, Patient being monitored and Timeout performed ?Patient Re-evaluated:Patient Re-evaluated prior to induction ?Oxygen Delivery Method: Circle system utilized ?Preoxygenation: Pre-oxygenation with 100% oxygen ?Induction Type: IV induction ?Ventilation: Mask ventilation without difficulty ?Laryngoscope Size: Mac and 3 ?Grade View: Grade I ?Tube type: Oral ?Tube size: 7.5 mm ?Number of attempts: 1 ?Airway Equipment and Method: Stylet ?Placement Confirmation: ETT inserted through vocal cords under direct vision, positive ETCO2, CO2 detector and breath sounds checked- equal and bilateral ?Secured at: 22 cm ?Tube secured with: Tape ?Dental Injury: Teeth and Oropharynx as per pre-operative assessment  ?Comments: ATOI ? ? ? ? ?

## 2021-05-30 NOTE — Interval H&P Note (Signed)
History and Physical Interval Note: ? ?05/30/2021 ?12:05 PM ? ?Yolanda Tyler  has presented today for surgery, with the diagnosis of Biliary Pancreatitis.  The various methods of treatment have been discussed with the patient and family. After consideration of risks, benefits and other options for treatment, the patient has consented to  Procedure(s): ?LAPAROSCOPIC CHOLECYSTECTOMY WITH INTRAOPERATIVE CHOLANGIOGRAM (N/A) as a surgical intervention.  The patient's history has been reviewed, patient examined, no change in status, stable for surgery.  I have reviewed the patient's chart and labs.  Questions were answered to the patient's satisfaction.   ? ? ?[name] ? ? ?

## 2021-05-30 NOTE — Progress Notes (Signed)
PROGRESS NOTE  Yolanda Tyler [id number]:[id number] DOB: 1985-05-15   PCP: Jacky Kindle, FNP  Patient is from: Home.  DOA: 05/28/2021 LOS: 2  Chief complaints:  Chief Complaint  Patient presents with   Abdominal Pain     Brief Narrative / Interim history: 36 year old F with PMH of morbid obesity, anxiety and depression presenting with fever, nausea, vomiting and abdominal pain found to have acute calculus pancreatitis.  Lipase > 10,000.  AST 600.  ALT 520.  Total bili 1.8.  WBC 14,000.  RUQ US demonstrated cholelithiasis with borderline CBD dilation.  CT A/P with peripancreatic edema but no necrosis or pseudocyst.  [facility] consulted.  MRI/MRCP showed cholelithiasis and pancreatitis but no biliary dilation, choledocholithiasis, pseudocyst or pancreatic necrosis.   Patient's pancreatitis improved.  General surgery consulted.  Patient to undergo lap chole on 05/30/2021.   Subjective: Seen and examined earlier this morning before she went for her lap chole.  No major events overnight of this morning.  Denies nausea, vomiting or abdominal pain.  Feels hungry.  Likes to eat.  Objective: Vitals:   05/29/21 0616 05/29/21 1257 05/29/21 2201 05/30/21 1107  BP: 118/66 (!) 117/56 129/72 (!) 163/82  Pulse: 89 79 81 79  Resp: 14 18 14 18   Temp: 98.2 F (36.8 C) 98 F (36.7 C) 98.4 F (36.9 C) 98.2 F (36.8 C)  TempSrc: Oral Oral Oral Oral  SpO2: 98% 97% 98% 100%  Weight:      Height:        Examination:  GENERAL: No apparent distress.  Nontoxic. HEENT: MMM.  Vision and hearing grossly intact.  NECK: Supple.  No apparent JVD.  RESP:  No IWOB.  Fair aeration bilaterally. CVS:  RRR. Heart sounds normal.  ABD/GI/GU: BS+. Abd soft, NTND.  MSK/EXT:  Moves extremities. No apparent deformity. No edema.  SKIN: no apparent skin lesion or wound NEURO: Awake, alert and oriented appropriately.  No apparent focal neuro deficit. PSYCH: Calm. Normal affect.   Procedures:  None  Microbiology  summarized: COVID-19 and influenza PCR nonreactive.  Assessment and Plan: * Acute biliary pancreatitis- (present on admission) RUQ and MRCP with cholelithiasis but no choledocholithiasis.  She might have passed biliary stone.  Does not drink alcohol.  Triglyceride 81.  No necrosis, pseudocyst or signs of infection.  Lipase trended from > 10,000>> 56.  LFT and symptoms improved. -Plan for lap chole on 3/1. -Continue IV Cipro and Flagyl -Pain control and antiemetics  Cholelithiasis See above.  Elevated liver enzymes Most likely passed biliary stone.  Improved. -Continue monitoring  Normocytic anemia No signs or evidence of bleeding.  Likely dilutional from IV fluid. -Check anemia panel in the morning -Recheck CBC in the morning  Hypokalemia Resolved.  Chronic asthma Stable.  Only on albuterol at home. -Continue inhalers as needed  MDD (major depressive disorder)- (present on admission) Anxiety disorder: Mood stable continue home meds  Prediabetes- (present on admission) A1c 5.7%. -Lifestyle change  Morbid obesity (HCC)- (present on admission) Body mass index is 40.77 kg/m. -Encourage lifestyle change to lose weight.        DVT prophylaxis:  SCDs Start: 05/28/21 1936  Code Status: Full code Family Communication: Patient and/or RN. Available if any question.  Level of care: Med-Surg Status is: Inpatient Remains inpatient appropriate because: Due to acute calculus pancreatitis requiring surgical intervention  Final disposition: Likely home once medically stable.  Consultants:  Gastroenterology General surgery   Sch Meds:  Scheduled Meds:  [MAR Hold] buPROPion  150 mg Oral Daily   [MAR Hold] DULoxetine  40 mg Oral BID   [MAR Hold] lamoTRIgine  200 mg Oral Daily   [MAR Hold] traZODone  100 mg Oral QHS   Continuous Infusions:  [MAR Hold] sodium chloride     [MAR Hold] ciprofloxacin 400 mg (05/30/21 0303)   lactated ringers 10 mL/hr at 05/30/21 1109    [MAR Hold] metronidazole 500 mg (05/30/21 0604)   PRN Meds:.[MAR Hold] sodium chloride, [MAR Hold] albuterol, [MAR Hold] fentaNYL (SUBLIMAZE) injection, [MAR Hold] ondansetron (ZOFRAN) IV, [MAR Hold] polyethylene glycol  Antimicrobials: Anti-infectives (From admission, onward)    Start     Dose/Rate Route Frequency Ordered Stop   05/29/21 0600  [MAR Hold]  metroNIDAZOLE (FLAGYL) IVPB 500 mg        (MAR Hold since Wed 05/30/2021 at 1102.Hold Reason: Transfer to a Procedural area)   500 mg 100 mL/hr over 60 Minutes Intravenous Every 12 hours 05/28/21 1937     05/29/21 0300  [MAR Hold]  ciprofloxacin (CIPRO) IVPB 400 mg        (MAR Hold since Wed 05/30/2021 at 1102.Hold Reason: Transfer to a Procedural area)   400 mg 200 mL/hr over 60 Minutes Intravenous Every 12 hours 05/28/21 1937     05/28/21 1515  ciprofloxacin (CIPRO) IVPB 400 mg       See Hyperspace for full Linked Orders Report.   400 mg 200 mL/hr over 60 Minutes Intravenous  Once 05/28/21 1504 05/28/21 1622   05/28/21 1515  metroNIDAZOLE (FLAGYL) IVPB 500 mg       See Hyperspace for full Linked Orders Report.   500 mg 100 mL/hr over 60 Minutes Intravenous  Once 05/28/21 1504 05/28/21 1754        I have personally reviewed the following labs and images: CBC: Recent Labs  Lab 05/28/21 1320 05/29/21 0516 05/30/21 0527  WBC 14.0* 8.0 8.3  HGB 12.8 12.6 10.4*  HCT 40.4 39.4 32.7*  MCV 87.1 89.5 90.1  PLT 353 277 255   BMP &GFR Recent Labs  Lab 05/28/21 1320 05/29/21 0516 05/30/21 0527  NA 136 136 135  K 3.5 3.3* 3.7  CL 100 104 103  CO2 25 26 27   GLUCOSE 130* 105* 107*  BUN 11 7 6   CREATININE 0.72 0.69 0.72  CALCIUM 9.4 8.2* 8.3*  MG  --  2.0  --   PHOS  --  3.4  --    Estimated Creatinine Clearance: 122.9 mL/min (by C-G formula based on SCr of 0.72 mg/dL). Liver & Pancreas: Recent Labs  Lab 05/28/21 1320 05/29/21 0516 05/30/21 0527  AST 599* 210* 53*  ALT 520* 394* 210*  ALKPHOS 85 96 73  BILITOT  1.8* 1.0 0.7  PROT 7.2 6.6 5.7*  ALBUMIN 4.3 3.7 3.2*   Recent Labs  Lab 05/28/21 1320 05/29/21 0516 05/30/21 0527  LIPASE >10,000* 512* 86*   No results for input(s): AMMONIA in the last 168 hours. Diabetic: No results for input(s): HGBA1C in the last 72 hours. No results for input(s): GLUCAP in the last 168 hours. Cardiac Enzymes: No results for input(s): CKTOTAL, CKMB, CKMBINDEX, TROPONINI in the last 168 hours. No results for input(s): PROBNP in the last 8760 hours. Coagulation Profile: Recent Labs  Lab 05/29/21 0516  INR 1.0   Thyroid Function Tests: No results for input(s): TSH, T4TOTAL, FREET4, T3FREE, THYROIDAB in the last 72 hours. Lipid Profile: Recent Labs    05/30/21 0527  TRIG 81   Anemia Panel: No  results for input(s): VITAMINB12, FOLATE, FERRITIN, TIBC, IRON, RETICCTPCT in the last 72 hours. Urine analysis:    Component Value Date/Time   COLORURINE YELLOW 05/28/2021 1320   APPEARANCEUR CLEAR 05/28/2021 1320   LABSPEC 1.020 05/28/2021 1320   PHURINE 6.0 05/28/2021 1320   GLUCOSEU NEGATIVE 05/28/2021 1320   HGBUR NEGATIVE 05/28/2021 1320   BILIRUBINUR SMALL (A) 05/28/2021 1320   KETONESUR NEGATIVE 05/28/2021 1320   PROTEINUR TRACE (A) 05/28/2021 1320   NITRITE NEGATIVE 05/28/2021 1320   LEUKOCYTESUR NEGATIVE 05/28/2021 1320   Sepsis Labs: Invalid input(s): PROCALCITONIN, LACTICIDVEN  Microbiology: Recent Results (from the past 240 hour(s))  Resp Panel by RT-PCR (Flu A&B, Covid) Nasopharyngeal Swab     Status: None   Collection Time: 05/28/21  3:19 PM   Specimen: Nasopharyngeal Swab; Nasopharyngeal(NP) swabs in vial transport medium  Result Value Ref Range Status   SARS Coronavirus 2 by RT PCR NEGATIVE NEGATIVE Final    Comment: (NOTE) SARS-CoV-2 target nucleic acids are NOT DETECTED.  The SARS-CoV-2 RNA is generally detectable in upper respiratory specimens during the acute phase of infection. The lowest concentration of SARS-CoV-2 viral  copies this assay can detect is 138 copies/mL. A negative result does not preclude SARS-Cov-2 infection and should not be used as the sole basis for treatment or other patient management decisions. A negative result may occur with  improper specimen collection/handling, submission of specimen other than nasopharyngeal swab, presence of viral mutation(s) within the areas targeted by this assay, and inadequate number of viral copies(<138 copies/mL). A negative result must be combined with clinical observations, patient history, and epidemiological information. The expected result is Negative.  Fact Sheet for Patients:  BloggerCourse.com  Fact Sheet for Healthcare Providers:  SeriousBroker.it  This test is no t yet approved or cleared by the [place] FDA and  has been authorized for detection and/or diagnosis of SARS-CoV-2 by FDA under an Emergency Use Authorization (EUA). This EUA will remain  in effect (meaning this test can be used) for the duration of the COVID-19 declaration under Section 564(b)(1) of the Act, 21 U.S.C.section 360bbb-3(b)(1), unless the authorization is terminated  or revoked sooner.       Influenza A by PCR NEGATIVE NEGATIVE Final   Influenza B by PCR NEGATIVE NEGATIVE Final    Comment: (NOTE) The Xpert Xpress SARS-CoV-2/FLU/RSV plus assay is intended as an aid in the diagnosis of influenza from Nasopharyngeal swab specimens and should not be used as a sole basis for treatment. Nasal washings and aspirates are unacceptable for Xpert Xpress SARS-CoV-2/FLU/RSV testing.  Fact Sheet for Patients: BloggerCourse.com  Fact Sheet for Healthcare Providers: SeriousBroker.it  This test is not yet approved or cleared by the [place] FDA and has been authorized for detection and/or diagnosis of SARS-CoV-2 by FDA under an Emergency Use Authorization (EUA). This  EUA will remain in effect (meaning this test can be used) for the duration of the COVID-19 declaration under Section 564(b)(1) of the Act, 21 U.S.C. section 360bbb-3(b)(1), unless the authorization is terminated or revoked.  Performed at [facility], [address] [place], [place] [address]     Radiology Studies: No results found.    Sanders Manninen T. Lemario Chaikin [facility]  If 7PM-7AM, please contact night-coverage [email or web address] 05/30/2021, 12:29 PM

## 2021-05-31 ENCOUNTER — Encounter (HOSPITAL_COMMUNITY): Payer: Self-pay | Admitting: General Surgery

## 2021-05-31 DIAGNOSIS — D509 Iron deficiency anemia, unspecified: Secondary | ICD-10-CM

## 2021-05-31 DIAGNOSIS — F419 Anxiety disorder, unspecified: Secondary | ICD-10-CM

## 2021-05-31 DIAGNOSIS — F32A Depression, unspecified: Secondary | ICD-10-CM

## 2021-05-31 DIAGNOSIS — D72829 Elevated white blood cell count, unspecified: Secondary | ICD-10-CM | POA: Diagnosis not present

## 2021-05-31 LAB — CBC
HCT: 33.6 % — ABNORMAL LOW (ref 36.0–46.0)
Hemoglobin: 10.7 g/dL — ABNORMAL LOW (ref 12.0–15.0)
MCH: 28.5 pg (ref 26.0–34.0)
MCHC: 31.8 g/dL (ref 30.0–36.0)
MCV: 89.4 fL (ref 80.0–100.0)
Platelets: 302 10*3/uL (ref 150–400)
RBC: 3.76 MIL/uL — ABNORMAL LOW (ref 3.87–5.11)
RDW: 13.4 % (ref 11.5–15.5)
WBC: 14.1 10*3/uL — ABNORMAL HIGH (ref 4.0–10.5)
nRBC: 0 % (ref 0.0–0.2)

## 2021-05-31 LAB — MAGNESIUM: Magnesium: 2 mg/dL (ref 1.7–2.4)

## 2021-05-31 LAB — COMPREHENSIVE METABOLIC PANEL
ALT: 151 U/L — ABNORMAL HIGH (ref 0–44)
AST: 29 U/L (ref 15–41)
Albumin: 3.2 g/dL — ABNORMAL LOW (ref 3.5–5.0)
Alkaline Phosphatase: 67 U/L (ref 38–126)
Anion gap: 6 (ref 5–15)
BUN: 7 mg/dL (ref 6–20)
CO2: 25 mmol/L (ref 22–32)
Calcium: 8.2 mg/dL — ABNORMAL LOW (ref 8.9–10.3)
Chloride: 105 mmol/L (ref 98–111)
Creatinine, Ser: 0.56 mg/dL (ref 0.44–1.00)
GFR, Estimated: >60 mL/min (ref >60)
Glucose, Bld: 124 mg/dL — ABNORMAL HIGH (ref 70–99)
Potassium: 3.8 mmol/L (ref 3.5–5.1)
Sodium: 136 mmol/L (ref 135–145)
Total Bilirubin: 0.2 mg/dL — ABNORMAL LOW (ref 0.3–1.2)
Total Protein: 6 g/dL — ABNORMAL LOW (ref 6.5–8.1)

## 2021-05-31 LAB — FOLATE: Folate: 26.3 ng/mL (ref >5.9)

## 2021-05-31 LAB — IRON AND TIBC
Iron: 28 ug/dL (ref 28–170)
Saturation Ratios: 9 % — ABNORMAL LOW (ref 10.4–31.8)
TIBC: 328 ug/dL (ref 250–450)
UIBC: 300 ug/dL

## 2021-05-31 LAB — LIPASE, BLOOD: Lipase: 44 U/L (ref 11–51)

## 2021-05-31 LAB — RETICULOCYTES
Immature Retic Fract: 20.4 % — ABNORMAL HIGH (ref 2.3–15.9)
RBC.: 3.69 MIL/uL — ABNORMAL LOW (ref 3.87–5.11)
Retic Count, Absolute: 66.4 10*3/uL (ref 19.0–186.0)
Retic Ct Pct: 1.8 % (ref 0.4–3.1)

## 2021-05-31 LAB — SURGICAL PATHOLOGY

## 2021-05-31 LAB — VITAMIN B12: Vitamin B-12: 245 pg/mL (ref 180–914)

## 2021-05-31 LAB — FERRITIN: Ferritin: 84 ng/mL (ref 11–307)

## 2021-05-31 MED ORDER — IBUPROFEN 200 MG PO TABS
600.0000 mg | ORAL_TABLET | Freq: Four times a day (QID) | ORAL | Status: DC
  Administered 2021-05-31: 600 mg via ORAL
  Filled 2021-05-31: qty 3

## 2021-05-31 MED ORDER — IBUPROFEN 600 MG PO TABS: 600.0000 mg | ORAL_TABLET | Freq: Four times a day (QID) | ORAL | 0 refills | Status: DC | PRN

## 2021-05-31 MED ORDER — SENNOSIDES-DOCUSATE SODIUM 8.6-50 MG PO TABS: 1.0000 | ORAL_TABLET | Freq: Two times a day (BID) | ORAL | 0 refills | Status: DC | PRN

## 2021-05-31 MED ORDER — OXYCODONE HCL 5 MG PO TABS: 5.0000 mg | ORAL_TABLET | Freq: Four times a day (QID) | ORAL | 0 refills | Status: DC | PRN

## 2021-05-31 MED ORDER — POLYETHYLENE GLYCOL 3350 17 G PO PACK: 17.0000 g | PACK | Freq: Every day | ORAL | 0 refills | Status: DC | PRN

## 2021-05-31 MED ORDER — FERROUS SULFATE 325 (65 FE) MG PO TBEC: 325.0000 mg | DELAYED_RELEASE_TABLET | Freq: Two times a day (BID) | ORAL | 1 refills | Status: DC

## 2021-05-31 MED ORDER — SODIUM CHLORIDE 0.9 % IV SOLN
250.0000 mg | Freq: Once | INTRAVENOUS | Status: AC
  Administered 2021-05-31: 250 mg via INTRAVENOUS
  Filled 2021-05-31: qty 20

## 2021-05-31 MED ORDER — ACETAMINOPHEN 325 MG PO TABS
650.0000 mg | ORAL_TABLET | Freq: Four times a day (QID) | ORAL | Status: DC
  Administered 2021-05-31: 650 mg via ORAL
  Filled 2021-05-31: qty 2

## 2021-05-31 MED ORDER — ACETAMINOPHEN 325 MG PO TABS: 650.0000 mg | ORAL_TABLET | Freq: Four times a day (QID) | ORAL | Status: DC | PRN

## 2021-05-31 NOTE — Assessment & Plan Note (Signed)
Likely demargination after surgery. ?-Check CBC at follow-up ?

## 2021-05-31 NOTE — Discharge Summary (Signed)
Physician Discharge Summary   Patient: Yolanda Tyler MRN: [id number] DOB: [birth year]  Admit date:     05/28/2021  Discharge date: 05/31/21  Discharge Physician: Almon Hercules   PCP: Jacky Kindle, FNP   Recommendations at discharge:   Outpatient follow-up with PCP in 1 to 2 weeks Outpatient follow-up with general surgery Check CBC and CMP at follow-up  Discharge Diagnoses: Principal Problem:   Acute biliary pancreatitis Active Problems:   Cholelithiasis   Elevated liver enzymes   Iron deficiency anemia   Morbid obesity (HCC)   Prediabetes   Anxiety and depression   Chronic asthma   Leukocytosis  Resolved Problems:   Hypokalemia   Hospital Course: 36 year old F with PMH of morbid obesity, anxiety and depression presenting with fever, nausea, vomiting and abdominal pain found to have acute calculus pancreatitis.  Lipase > 10,000.  AST 600.  ALT 520.  Total bili 1.8.  WBC 14,000.  RUQ US demonstrated cholelithiasis with borderline CBD dilation.  CT A/P with peripancreatic edema but no necrosis or pseudocyst.  [facility] consulted.  MRI/MRCP showed cholelithiasis and pancreatitis but no biliary dilation, choledocholithiasis, pseudocyst or pancreatic necrosis.   Patient's pancreatitis improved.  General surgery consulted.  Underwent lap chole on 05/30/2021.  Pancreatitis resolved.  LFT improved tremendously.  Tolerated soft diet.  Cleared for discharge by general surgery for outpatient follow-up.  Assessment and Plan: * Acute biliary pancreatitis RUQ [place] and MRCP with cholelithiasis but no choledocholithiasis.  She might have passed biliary stone.  Does not drink alcohol.  Triglyceride 81.  No necrosis, pseudocyst or signs of infection.  Lipase trended from > 10,000>> 44.  LFT resolving.  Tolerated soft diet. -Underwent lap chole on 3/1. -Received Cipro Flagyl from 2/27-3/2.  -Outpatient follow-up with general surgery -Check CMP at follow-up.  Cholelithiasis See above.  Iron  deficiency anemia Recent Labs    04/04/21 1013 05/28/21 1320 05/29/21 0516 05/30/21 0527 05/31/21 0524  HGB 12.3 12.8 12.6 10.4* 10.7*  Drop in Hgb likely dilutional.  Anemia panel consistent with iron deficiency.  Denies melena or hematochezia. -Received IV ferric gluconate 250 mg x 1 -Discharged on p.o. ferrous sulfate with bowel regimen -Recheck CBC at follow-up.   Elevated liver enzymes Most likely passed biliary stone.  Resolving. -Recheck CMP at follow-up  Leukocytosis Likely demargination after surgery. -Check CBC at follow-up  Chronic asthma Stable.  Only on albuterol at home. -Continue inhalers as needed  Anxiety and depression Stable.  Continue home medication  Prediabetes A1c 5.7%. -Lifestyle change -Continue home metformin  Morbid obesity (HCC) Body mass index is 40.77 kg/m. -Encourage lifestyle change to lose weight. -Continue metformin if she continues to tolerate  Hypokalemia-resolved as of 05/31/2021 Resolved.    Consultants: General surgery and gastroenterology Procedures performed: Lap chole Disposition: Home Diet recommendation:  Discharge Diet Orders (From admission, onward)     Start     Ordered   05/31/21 0000  Diet general        05/31/21 0959           Regular diet  DISCHARGE MEDICATION: Allergies as of 05/31/2021       Reactions   Apple Itching, Swelling   Tolerates if cooked   Metformin And Related Diarrhea   Pear Itching, Swelling   Tolerates if cooked   Penicillins Hives   Clindamycin/lincomycin Rash        Medication List     STOP taking these medications    azithromycin 250 MG tablet Commonly  known as: ZITHROMAX   benzonatate 100 MG capsule Commonly known as: Tessalon Perles   predniSONE 10 MG (21) Tbpk tablet Commonly known as: STERAPRED UNI-PAK 21 TAB       TAKE these medications    acetaminophen 325 MG tablet Commonly known as: TYLENOL Take 2 tablets (650 mg total) by mouth every 6 (six)  hours as needed for mild pain.   albuterol 108 (90 Base) MCG/ACT inhaler Commonly known as: VENTOLIN HFA INHALE 2 PUFFS BY MOUTH EVERY 6 HOURS AS NEEDED FOR WHEEZE OR SHORTNESS OF BREATH   buPROPion 150 MG 24 hr tablet Commonly known as: WELLBUTRIN XL Take 150 mg by mouth daily.   carboxymethylcellulose 0.5 % Soln Commonly known as: REFRESH PLUS 1 drop at bedtime as needed.   [employer] VIT D 5000 HIGH-POTENCY PO Take 5,000 Units by mouth at bedtime.   DULoxetine 20 MG capsule Commonly known as: CYMBALTA Take 40 mg by mouth 2 (two) times daily.   ferrous sulfate 325 (65 FE) MG EC tablet Take 1 tablet (325 mg total) by mouth 2 (two) times daily.   hydrocortisone 2.5 % lotion Apply 1 application topically daily as needed (rash).   ibuprofen 600 MG tablet Commonly known as: ADVIL Take 1 tablet (600 mg total) by mouth every 6 (six) hours as needed for moderate pain.   ketoconazole 2 % shampoo Commonly known as: NIZORAL Apply 1 application topically once a week.   lamoTRIgine 200 MG tablet Commonly known as: LAMICTAL Take 200 mg by mouth daily.   metFORMIN 750 MG 24 hr tablet Commonly known as: Glucophage XR Take 1 tablet (750 mg total) by mouth daily with breakfast. What changed: when to take this   methylphenidate 20 MG tablet Commonly known as: RITALIN Take 20 mg by mouth daily.   multivitamin with minerals Tabs tablet Take 1 tablet by mouth at bedtime.   oxyCODONE 5 MG immediate release tablet Commonly known as: Oxy IR/ROXICODONE Take 1 tablet (5 mg total) by mouth every 6 (six) hours as needed for severe pain.   polyethylene glycol 17 g packet Commonly known as: MIRALAX / GLYCOLAX Take 17 g by mouth daily as needed for mild constipation.   senna-docusate 8.6-50 MG tablet Commonly known as: Senokot-S Take 1 tablet by mouth 2 (two) times daily between meals as needed for moderate constipation.   traZODone 50 MG tablet Commonly known as: DESYREL Take 50-100 mg  by mouth See admin instructions. Takes at bedtime. Dose depends on how she's feeling.        Follow-up Information     [facility], [place]. Go on 06/21/2021.   Specialty: General Surgery Why: Your appointment is 06/21/21 at 3 pm Please arrive 30 minutes prior to your appointment to check in and fill out paperwork. Bring photo ID and insurance information. Contact information: [address] [place] [address] [phone]        Jacky Kindle, FNP. Schedule an appointment as soon as possible for a visit in 1 week(s).   Specialty: Family Medicine Contact information: [address] [place] [address] [phone]                 Discharge Exam: Ceasar Mons Weights   05/28/21 1318  Weight: 111.1 kg   Vitals:   05/30/21 2213 05/31/21 0544 05/31/21 0941 05/31/21 1036  BP: (!) 119/57 (!) 99/49 (!) 128/59 (!) 111/49  Pulse: 93 83 84 97  Resp: 16 14 18 18   Temp:  98.4 F (36.9 C) 97.9 F (36.6 C) 98.1 F (36.7 C) 98 F (36.7 C)  TempSrc: Oral Oral Oral Oral  SpO2: 92% 93% 96% 97%  Weight:      Height:        GENERAL: No apparent distress.  Nontoxic. HEENT: MMM.  Vision and hearing grossly intact.  NECK: Supple.  No apparent JVD.  RESP:  No IWOB.  Fair aeration bilaterally. CVS:  RRR. Heart sounds normal.  ABD/GI/GU: BS+. Abd soft, NTND.  Laparoscopic wound clean. MSK/EXT:  Moves extremities. No apparent deformity. No edema.  SKIN: no apparent skin lesion or wound NEURO: Awake and alert. Oriented appropriately.  No apparent focal neuro deficit. PSYCH: Calm. Normal affect.   Condition at discharge: good  The results of significant diagnostics from this hospitalization (including imaging, microbiology, ancillary and laboratory) are listed below for reference.   Imaging Studies: DG Cholangiogram Operative  Result Date: 05/30/2021 CLINICAL DATA:  Intraoperative cholangiogram during laparoscopic cholecystectomy.  EXAM: INTRAOPERATIVE CHOLANGIOGRAM FLUOROSCOPY TIME:  11 seconds COMPARISON:  MRCP-05/28/2021; CT abdomen and pelvis-05/28/2021 FINDINGS: Intraoperative cholangiographic images of the right upper abdominal quadrant during laparoscopic cholecystectomy are provided for review. Surgical clips overlie the expected location of the gallbladder fossa. Contrast injection demonstrates selective cannulation of the central aspect of the cystic duct. There is passage of contrast through the central aspect of the cystic duct with filling of a non dilated common bile duct. There is passage of contrast though the CBD and into the descending portion of the duodenum. There is minimal reflux of injected contrast into the common hepatic duct and central aspect of the non dilated intrahepatic biliary system. There is no definitive opacification of the pancreatic duct. There are no discrete filling defects within the opacified portions of the biliary system to suggest the presence of choledocholithiasis. IMPRESSION: No evidence of choledocholithiasis. Electronically Signed   By: Simonne ComeJohn  Watts M.D.   On: 05/30/2021 13:19   CT ABDOMEN PELVIS W CONTRAST  Result Date: 05/28/2021 CLINICAL DATA:  Abdominal pain EXAM: CT ABDOMEN AND PELVIS WITH CONTRAST TECHNIQUE: Multidetector CT imaging of the abdomen and pelvis was performed using the standard protocol following bolus administration of intravenous contrast. RADIATION DOSE REDUCTION: This exam was performed according to the departmental dose-optimization program which includes automated exposure control, adjustment of the mA and/or kV according to patient size and/or use of iterative reconstruction technique. CONTRAST:  100mL OMNIPAQUE IOHEXOL 300 MG/ML  SOLN COMPARISON:  Ultrasound abdomen done earlier today FINDINGS: Lower chest: Unremarkable. Hepatobiliary: Liver measures 19.2 cm in length. There is fatty infiltration. Gallbladder is distended. There is no wall thickening in  gallbladder. There is no fluid around the gallbladder. There is no significant dilation of bile ducts. Pancreas: There is peripancreatic stranding. There are no loculated fluid collections in the or around the pancreas. There is no dilation of pancreatic duct. Spleen: Unremarkable. Adrenals/Urinary Tract: Adrenals are not enlarged. There is no hydronephrosis. There are no renal or ureteral stones. Urinary bladder is unremarkable. Stomach/Bowel: Stomach is unremarkable. Small bowel loops are not dilated. Appendix is not seen. There is no focal pericecal inflammation. There is no significant wall thickening in colon. Scattered diverticula are seen in the left colon without evidence of focal acute diverticulitis. Vascular/Lymphatic: Unremarkable. Reproductive: There are small follicles in the ovaries. Uterus is to the right of midline. Trace amount of free fluid is seen in the cul-de-sac. Other: There is no signal ascites or pneumoperitoneum. Musculoskeletal: Unremarkable. IMPRESSION: There is peripancreatic edema suggesting acute pancreatitis.  There is no evidence of pancreatic necrosis. There are no loculated fluid collections in or around the pancreas. There is no evidence of intestinal obstruction or pneumoperitoneum. There is no hydronephrosis. Enlarged fatty liver. Trace amount of free fluid in cul-de-sac and pelvis may suggest physiological rupture of ovarian follicle. Diverticulosis of colon without signs of focal diverticulitis. Electronically Signed   By: Ernie Avena M.D.   On: 05/28/2021 16:48   MR 3D Recon At Scanner  Result Date: 05/29/2021 CLINICAL DATA:  Right upper quadrant pain. EXAM: MRI ABDOMEN WITH CONTRAST (WITH MRCP) TECHNIQUE: Multiplanar multisequence MR imaging of the abdomen was performed following the administration of intravenous contrast. Heavily T2-weighted images of the biliary and pancreatic ducts were obtained, and three-dimensional MRCP images were rendered by post  processing. CONTRAST:  17mL GADAVIST GADOBUTROL 1 MMOL/ML IV SOLN COMPARISON:  CT scan earlier same day FINDINGS: Lower chest: Unremarkable. Hepatobiliary: No suspicious focal abnormality within the liver parenchyma. Multiple gallstones evident measuring up to approximately 6 mm maximum size. No gallbladder wall thickening. No biliary dilatation. Pancreas: No main duct dilatation or focal pancreatic mass lesion. There is peripancreatic edema around the head of the pancreas, tracking into the pancreatico duodenal groove and along the transverse segment of the duodenum. Edema in the anterior pararenal space noted along the tail of pancreas extending laterally on towards the left paracolic gutter. No evidence for enhancing pancreatic mass lesion. Spleen:  No splenomegaly. No focal mass lesion. Adrenals/Urinary Tract: No adrenal nodule or mass. Kidneys unremarkable. Stomach/Bowel: Stomach is unremarkable. No gastric wall thickening. No evidence of outlet obstruction. As noted above, there is edema surrounding the descending and transverse segments of the duodenum without appreciable duodenal wall thickening. No small bowel or colonic dilatation within the visualized abdomen. Vascular/Lymphatic: No abdominal aortic aneurysm. There is no gastrohepatic or hepatoduodenal ligament lymphadenopathy. No retroperitoneal or mesenteric lymphadenopathy. Other:  No substantial intraperitoneal free fluid. Musculoskeletal: No focal suspicious marrow enhancement within the visualized bony anatomy. IMPRESSION: 1. Cholelithiasis. No biliary dilatation or choledocholithiasis. 2. Peripancreatic edema extending along the descending and transverse segments of the duodenum. No appreciable duodenal wall thickening. Imaging features most suggestive of pancreatitis. No evidence for pancreatic necrosis or pseudocyst. Duodenitis considered less likely. Electronically Signed   By: Kennith Center M.D.   On: 05/29/2021 05:25   MR ABDOMEN WITH MRCP W  CONTRAST  Result Date: 05/29/2021 CLINICAL DATA:  Right upper quadrant pain. EXAM: MRI ABDOMEN WITH CONTRAST (WITH MRCP) TECHNIQUE: Multiplanar multisequence MR imaging of the abdomen was performed following the administration of intravenous contrast. Heavily T2-weighted images of the biliary and pancreatic ducts were obtained, and three-dimensional MRCP images were rendered by post processing. CONTRAST:  28mL GADAVIST GADOBUTROL 1 MMOL/ML IV SOLN COMPARISON:  CT scan earlier same day FINDINGS: Lower chest: Unremarkable. Hepatobiliary: No suspicious focal abnormality within the liver parenchyma. Multiple gallstones evident measuring up to approximately 6 mm maximum size. No gallbladder wall thickening. No biliary dilatation. Pancreas: No main duct dilatation or focal pancreatic mass lesion. There is peripancreatic edema around the head of the pancreas, tracking into the pancreatico duodenal groove and along the transverse segment of the duodenum. Edema in the anterior pararenal space noted along the tail of pancreas extending laterally on towards the left paracolic gutter. No evidence for enhancing pancreatic mass lesion. Spleen:  No splenomegaly. No focal mass lesion. Adrenals/Urinary Tract: No adrenal nodule or mass. Kidneys unremarkable. Stomach/Bowel: Stomach is unremarkable. No gastric wall thickening. No evidence of outlet obstruction. As noted above, there  is edema surrounding the descending and transverse segments of the duodenum without appreciable duodenal wall thickening. No small bowel or colonic dilatation within the visualized abdomen. Vascular/Lymphatic: No abdominal aortic aneurysm. There is no gastrohepatic or hepatoduodenal ligament lymphadenopathy. No retroperitoneal or mesenteric lymphadenopathy. Other:  No substantial intraperitoneal free fluid. Musculoskeletal: No focal suspicious marrow enhancement within the visualized bony anatomy. IMPRESSION: 1. Cholelithiasis. No biliary dilatation or  choledocholithiasis. 2. Peripancreatic edema extending along the descending and transverse segments of the duodenum. No appreciable duodenal wall thickening. Imaging features most suggestive of pancreatitis. No evidence for pancreatic necrosis or pseudocyst. Duodenitis considered less likely. Electronically Signed   By: Kennith Center M.D.   On: 05/29/2021 05:25   US Abdomen Limited RUQ (LIVER/GB)  Result Date: 05/28/2021 CLINICAL DATA:  Right upper quadrant pain. EXAM: ULTRASOUND ABDOMEN LIMITED RIGHT UPPER QUADRANT COMPARISON:  None. FINDINGS: Gallbladder: Multiple gallstones measuring up to 0.8 cm. No gallbladder wall thickening. No sonographic Murphy sign noted by sonographer. Common bile duct: Diameter: 0.7 cm, borderline dilated Liver: No focal lesion identified. Within normal limits in parenchymal echogenicity. Portal vein is patent on color Doppler imaging with normal direction of blood flow towards the liver. Other: None. IMPRESSION: 1.  Cholelithiasis without evidence of acute cholecystitis. 2. Borderline dilation of the common bile duct. If there is concern for biliary duct obstruction consider MRI/MRCP. Electronically Signed   By: Emmaline Kluver M.D.   On: 05/28/2021 14:18    Microbiology: Results for orders placed or performed during the hospital encounter of 05/28/21  Resp Panel by RT-PCR (Flu A&B, Covid) Nasopharyngeal Swab     Status: None   Collection Time: 05/28/21  3:19 PM   Specimen: Nasopharyngeal Swab; Nasopharyngeal(NP) swabs in vial transport medium  Result Value Ref Range Status   SARS Coronavirus 2 by RT PCR NEGATIVE NEGATIVE Final    Comment: (NOTE) SARS-CoV-2 target nucleic acids are NOT DETECTED.  The SARS-CoV-2 RNA is generally detectable in upper respiratory specimens during the acute phase of infection. The lowest concentration of SARS-CoV-2 viral copies this assay can detect is 138 copies/mL. A negative result does not preclude SARS-Cov-2 infection and should  not be used as the sole basis for treatment or other patient management decisions. A negative result may occur with  improper specimen collection/handling, submission of specimen other than nasopharyngeal swab, presence of viral mutation(s) within the areas targeted by this assay, and inadequate number of viral copies(<138 copies/mL). A negative result must be combined with clinical observations, patient history, and epidemiological information. The expected result is Negative.  Fact Sheet for Patients:  BloggerCourse.com  Fact Sheet for Healthcare Providers:  SeriousBroker.it  This test is no t yet approved or cleared by the [place] FDA and  has been authorized for detection and/or diagnosis of SARS-CoV-2 by FDA under an Emergency Use Authorization (EUA). This EUA will remain  in effect (meaning this test can be used) for the duration of the COVID-19 declaration under Section 564(b)(1) of the Act, 21 U.S.C.section 360bbb-3(b)(1), unless the authorization is terminated  or revoked sooner.       Influenza A by PCR NEGATIVE NEGATIVE Final   Influenza B by PCR NEGATIVE NEGATIVE Final    Comment: (NOTE) The Xpert Xpress SARS-CoV-2/FLU/RSV plus assay is intended as an aid in the diagnosis of influenza from Nasopharyngeal swab specimens and should not be used as a sole basis for treatment. Nasal washings and aspirates are unacceptable for Xpert Xpress SARS-CoV-2/FLU/RSV testing.  Fact Sheet for Patients: BloggerCourse.com  Fact Sheet for Healthcare Providers: SeriousBroker.ithttps://www.fda.gov/media/152162/download  This test is not yet approved or cleared by the [place] FDA and has been authorized for detection and/or diagnosis of SARS-CoV-2 by FDA under an Emergency Use Authorization (EUA). This EUA will remain in effect (meaning this test can be used) for the duration of the COVID-19 declaration under  Section 564(b)(1) of the Act, 21 U.S.C. section 360bbb-3(b)(1), unless the authorization is terminated or revoked.  Performed at [facility], [address], [place], [place] [address]     Labs: CBC: Recent Labs  Lab 05/28/21 1320 05/29/21 0516 05/30/21 0527 05/31/21 0524  WBC 14.0* 8.0 8.3 14.1*  HGB 12.8 12.6 10.4* 10.7*  HCT 40.4 39.4 32.7* 33.6*  MCV 87.1 89.5 90.1 89.4  PLT 353 277 255 302   Basic Metabolic Panel: Recent Labs  Lab 05/28/21 1320 05/29/21 0516 05/30/21 0527 05/31/21 0524  NA 136 136 135 136  K 3.5 3.3* 3.7 3.8  CL 100 104 103 105  CO2 25 26 27 25   GLUCOSE 130* 105* 107* 124*  BUN 11 7 6 7   CREATININE 0.72 0.69 0.72 0.56  CALCIUM 9.4 8.2* 8.3* 8.2*  MG  --  2.0  --  2.0  PHOS  --  3.4  --   --    Liver Function Tests: Recent Labs  Lab 05/28/21 1320 05/29/21 0516 05/30/21 0527 05/31/21 0524  AST 599* 210* 53* 29  ALT 520* 394* 210* 151*  ALKPHOS 85 96 73 67  BILITOT 1.8* 1.0 0.7 0.2*  PROT 7.2 6.6 5.7* 6.0*  ALBUMIN 4.3 3.7 3.2* 3.2*   CBG: No results for input(s): GLUCAP in the last 168 hours.  Discharge time spent: greater than 30 minutes.  Signed: Almon Herculesaye T Rashada Klontz, MD [facility] 05/31/2021

## 2021-05-31 NOTE — Progress Notes (Signed)
Patient ID: Yolanda Tyler, female   DOB: 1985/12/28, 36 y.o.   MRN: [id number] ?[facility] ?Progress Note ? ?1 Day Post-Op  ?Subjective: ?CC-  ?Feeling well. Abdomen sore but pain controlled. Tolerating diet. Passing flatus.  ? ?Objective: ?Vital signs in last 24 hours: ?Temp:  [97.8 ?F (36.6 ?C)-98.5 ?F (36.9 ?C)] 98.1 ?F (36.7 ?C) (03/02 0941) ?Pulse Rate:  [79-93] 84 (03/02 0941) ?Resp:  [11-21] 18 (03/02 0941) ?BP: (99-172)/(49-93) 128/59 (03/02 0941) ?SpO2:  [85 %-100 %] 96 % (03/02 0941) ?Last BM Date : 05/28/21 ? ?Intake/Output from previous day: ?03/01 0701 - 03/02 0700 ?In: 4241.1 [P.O.:600; I.V.:2765.9; IV Piggyback:875.3] ?Out: 50 [Blood:50] ?Intake/Output this shift: ?Total I/O ?In: 240 [P.O.:240] ?Out: -  ? ?PE: ?Gen:  Alert, NAD, pleasant ?Abd: soft, ND, NT. Lap incision cdi, small area of periumbilical incision opened superficially - applied dermabond, steri strip ? ?Lab Results:  ?Recent Labs  ?  05/30/21 ?7893 05/31/21 ?8101  ?WBC 8.3 14.1*  ?HGB 10.4* 10.7*  ?HCT 32.7* 33.6*  ?PLT 255 302  ? ?BMET ?Recent Labs  ?  05/30/21 ?7510 05/31/21 ?2585  ?NA 135 136  ?K 3.7 3.8  ?CL 103 105  ?CO2 27 25  ?GLUCOSE 107* 124*  ?BUN 6 7  ?CREATININE 0.72 0.56  ?CALCIUM 8.3* 8.2*  ? ?PT/INR ?Recent Labs  ?  05/29/21 ?2778  ?LABPROT 13.0  ?INR 1.0  ? ?CMP  ?   ?Component Value Date/Time  ? NA 136 05/31/2021 0524  ? NA 139 04/04/2021 1013  ? K 3.8 05/31/2021 0524  ? CL 105 05/31/2021 0524  ? CO2 25 05/31/2021 0524  ? GLUCOSE 124 (H) 05/31/2021 0524  ? BUN 7 05/31/2021 0524  ? BUN 11 04/04/2021 1013  ? CREATININE 0.56 05/31/2021 0524  ? CALCIUM 8.2 (L) 05/31/2021 0524  ? PROT 6.0 (L) 05/31/2021 0524  ? PROT 6.8 04/04/2021 1013  ? ALBUMIN 3.2 (L) 05/31/2021 0524  ? ALBUMIN 4.4 04/04/2021 1013  ? AST 29 05/31/2021 0524  ? ALT 151 (H) 05/31/2021 0524  ? ALKPHOS 67 05/31/2021 0524  ? BILITOT 0.2 (L) 05/31/2021 0524  ? BILITOT 0.4 04/04/2021 1013  ? GFRNONAA >60 05/31/2021 0524  ? GFRAA 116 12/29/2019 0938   ? ?Lipase  ?   ?Component Value Date/Time  ? LIPASE 44 05/31/2021 0524  ? ? ? ? ? ?Studies/Results: ?DG Cholangiogram Operative ? ?Result Date: 05/30/2021 ?CLINICAL DATA:  Intraoperative cholangiogram during laparoscopic cholecystectomy. EXAM: INTRAOPERATIVE CHOLANGIOGRAM FLUOROSCOPY TIME:  11 seconds COMPARISON:  [facility]-05/28/2021; CT abdomen and pelvis-05/28/2021 FINDINGS: Intraoperative cholangiographic images of the right upper abdominal quadrant during laparoscopic cholecystectomy are provided for review. Surgical clips overlie the expected location of the gallbladder fossa. Contrast injection demonstrates selective cannulation of the central aspect of the cystic duct. There is passage of contrast through the central aspect of the cystic duct with filling of a non dilated common bile duct. There is passage of contrast though the CBD and into the descending portion of the duodenum. There is minimal reflux of injected contrast into the common hepatic duct and central aspect of the non dilated intrahepatic biliary system. There is no definitive opacification of the pancreatic duct. There are no discrete filling defects within the opacified portions of the biliary system to suggest the presence of choledocholithiasis. IMPRESSION: No evidence of choledocholithiasis. Electronically Signed   By: Simonne Come M.D.   On: 05/30/2021 13:19   ? ?Anti-infectives: ?Anti-infectives (From admission, onward)  ? ? Start  Dose/Rate Route Frequency Ordered Stop  ? 05/29/21 0600  metroNIDAZOLE (FLAGYL) IVPB 500 mg       ? 500 mg ?100 mL/hr over 60 Minutes Intravenous Every 12 hours 05/28/21 1937    ? 05/29/21 0300  ciprofloxacin (CIPRO) IVPB 400 mg  Status:  Discontinued       ? 400 mg ?200 mL/hr over 60 Minutes Intravenous Every 12 hours 05/28/21 1937 05/30/21 1513  ? 05/28/21 1515  ciprofloxacin (CIPRO) IVPB 400 mg       ?See Hyperspace for full Linked Orders Report.  ? 400 mg ?200 mL/hr over 60 Minutes Intravenous  Once 05/28/21  1504 05/28/21 1622  ? 05/28/21 1515  metroNIDAZOLE (FLAGYL) IVPB 500 mg       ?See Hyperspace for full Linked Orders Report.  ? 500 mg ?100 mL/hr over 60 Minutes Intravenous  Once 05/28/21 1504 05/28/21 1754  ? ?  ? ? ? ?Assessment/Plan ?Pancreatitis, suspect biliary pancreatitis  ?POD#1 S/p laparoscopic cholecystectomy with IOC 3/1 Dr. Carolynne Edouard ?- IOC negative ?- Regular diet. Ok for discharge from surgical standpoint. Discharge instructions and follow up info on AVS. Rx for pain medication sent to pharmacy. She does not need any more abx from our standpoint. ?  ?ID - cipro/flagyl 2/27>> ?VTE - ok for chemical dvt ppx from surgical standpoint ?FEN - reg diet ?Foley - none ?  ?Depression/ anxiety ?Obesity BMI 40.77 ? ? LOS: 3 days  ? ? ?Franne Forts, PA-C ?[facility] ?05/31/2021, 10:37 AM ?Please see Amion for pager number during day hours 7:00am-4:30pm ? ?

## 2021-06-02 ENCOUNTER — Emergency Department (HOSPITAL_BASED_OUTPATIENT_CLINIC_OR_DEPARTMENT_OTHER): Payer: BC Managed Care – PPO

## 2021-06-02 ENCOUNTER — Emergency Department (HOSPITAL_BASED_OUTPATIENT_CLINIC_OR_DEPARTMENT_OTHER)
Admission: EM | Admit: 2021-06-02 | Discharge: 2021-06-02 | Disposition: A | Discharge status: Home / Self Care | Payer: BC Managed Care – PPO | Attending: Emergency Medicine | Admitting: Emergency Medicine

## 2021-06-02 ENCOUNTER — Encounter (HOSPITAL_BASED_OUTPATIENT_CLINIC_OR_DEPARTMENT_OTHER): Payer: Self-pay | Admitting: Emergency Medicine

## 2021-06-02 ENCOUNTER — Other Ambulatory Visit: Payer: Self-pay

## 2021-06-02 DIAGNOSIS — M7989 Other specified soft tissue disorders: Secondary | ICD-10-CM | POA: Diagnosis present

## 2021-06-02 DIAGNOSIS — I82611 Acute embolism and thrombosis of superficial veins of right upper extremity: Secondary | ICD-10-CM

## 2021-06-02 NOTE — Discharge Instructions (Addendum)
Please use Tylenol or ibuprofen for pain.  You may use 600-800 mg ibuprofen every 6 hours or 1000 mg of Tylenol every 6 hours.  You may choose to alternate between the 2.  This would be most effective.  Not to exceed 4 g of Tylenol within 24 hours.  Not to exceed 3200 mg ibuprofen 24 hours. ? ?At the very least a recommend that you take the ibuprofen as described above.  You can use an Ace wrap, apply ice, or heating pad to the affected area as discussed to help with pain, swelling. ? ?Your symptoms may linger for as long as 2 to 6 weeks according to clinical guidelines.  I recommend that you follow-up with your primary care doctor in 7 to 10 days for further evaluation of the right upper extremity. ? ?If you have worsening pain, swelling, purulent drainage from the affected area please return for further evaluation.  If develop any chest pain, shortness of breath, or difficulty breathing please return for further evaluation. ?

## 2021-06-02 NOTE — ED Triage Notes (Signed)
Pt had IV in right lower arm, taken out on Thursday. Pain and swelling to area that has continued to worsen.  ?

## 2021-06-02 NOTE — ED Provider Notes (Addendum)
[facility] EMERGENCY DEPT Provider Note   CSN: [id number] Arrival date & time: 06/02/21  1612     History  Chief Complaint  Patient presents with   Arm Swelling    Yolanda Tyler is a 36 y.o. female with past medical history significant for recent cholecystectomy who had an IV placed in her right arm, who has had some right arm swelling, redness, tightness since being discharged on Thursday.  She denies any fever, chills, shortness of breath, chest pain.  She denies any hemoptysis.  She reports family history of clots but no personal history of clots.  She reports that she feels may be slightly woozy but not lightheaded or like she is going to pass out.  HPI     Home Medications Prior to Admission medications   Medication Sig Start Date End Date Taking? Authorizing Provider  acetaminophen (TYLENOL) 325 MG tablet Take 2 tablets (650 mg total) by mouth every 6 (six) hours as needed for mild pain. 05/31/21   Meuth, Brooke A, PA-C  albuterol (VENTOLIN HFA) 108 (90 Base) MCG/ACT inhaler INHALE 2 PUFFS BY MOUTH EVERY 6 HOURS AS NEEDED FOR WHEEZE OR SHORTNESS OF BREATH 05/03/21   Merita Norton T, FNP  buPROPion (WELLBUTRIN XL) 150 MG 24 hr tablet Take 150 mg by mouth daily. 12/19/20   [provider]  carboxymethylcellulose (REFRESH PLUS) 0.5 % SOLN 1 drop at bedtime as needed.    [provider]  Cholecalciferol ([employer] VIT D 5000 HIGH-POTENCY PO) Take 5,000 Units by mouth at bedtime.    [provider]  DULoxetine (CYMBALTA) 20 MG capsule Take 40 mg by mouth 2 (two) times daily. 05/22/21   [provider]  ferrous sulfate 325 (65 FE) MG EC tablet Take 1 tablet (325 mg total) by mouth 2 (two) times daily. 05/31/21 11/27/21  Almon Hercules, MD  hydrocortisone 2.5 % lotion Apply 1 application topically daily as needed (rash). 07/01/20   [provider]  ibuprofen (ADVIL) 600 MG tablet Take 1 tablet (600 mg total) by mouth every 6 (six) hours as  needed for moderate pain. 05/31/21   Meuth, Brooke A, PA-C  ketoconazole (NIZORAL) 2 % shampoo Apply 1 application topically once a week. 08/06/20   [provider]  lamoTRIgine (LAMICTAL) 200 MG tablet Take 200 mg by mouth daily. 03/20/21   [provider]  metFORMIN (GLUCOPHAGE XR) 750 MG 24 hr tablet Take 1 tablet (750 mg total) by mouth daily with breakfast. Patient taking differently: Take 750 mg by mouth 2 (two) times daily with a meal. 04/10/21   Jacky Kindle, FNP  methylphenidate (RITALIN) 20 MG tablet Take 20 mg by mouth daily. 01/28/21   [provider]  Multiple Vitamin (MULTIVITAMIN WITH MINERALS) TABS tablet Take 1 tablet by mouth at bedtime.    [provider]  oxyCODONE (OXY IR/ROXICODONE) 5 MG immediate release tablet Take 1 tablet (5 mg total) by mouth every 6 (six) hours as needed for severe pain. 05/31/21   Meuth, Brooke A, PA-C  polyethylene glycol (MIRALAX / GLYCOLAX) 17 g packet Take 17 g by mouth daily as needed for mild constipation. 05/31/21   Meuth, Brooke A, PA-C  senna-docusate (SENOKOT-S) 8.6-50 MG tablet Take 1 tablet by mouth 2 (two) times daily between meals as needed for moderate constipation. 05/31/21   Almon Hercules, MD  traZODone (DESYREL) 50 MG tablet Take 50-100 mg by mouth See admin instructions. Takes at bedtime. Dose depends on how she's feeling.  [provider]      Allergies    Apple, Metformin and related, Pear, Penicillins, and Clindamycin/lincomycin    Review of Systems   Review of Systems  Skin:  Positive for color change and wound.  All other systems reviewed and are negative.  Physical Exam Updated Vital Signs BP (!) 145/84 (BP Location: Left Arm)    Pulse 77    Temp 98.7 F (37.1 C) (Oral)    Resp 17    Ht 5\' 5"  (1.651 m)    Wt 111.1 kg    LMP 05/14/2021 (Approximate)    SpO2 100%    BMI 40.77 kg/m  Physical Exam Vitals and nursing note reviewed.  Constitutional:      General: She is not in acute  distress.    Appearance: Normal appearance.  HENT:     Head: Normocephalic and atraumatic.  Eyes:     General:        Right eye: No discharge.        Left eye: No discharge.  Cardiovascular:     Rate and Rhythm: Normal rate and regular rhythm.  Pulmonary:     Effort: Pulmonary effort is normal. No respiratory distress.  Musculoskeletal:        General: No deformity.  Skin:    General: Skin is warm and dry.     Comments: Some light bruising, redness, swelling noted on the right lower arm.  There is no active wound or purulent drainage noted.  Neurological:     Mental Status: She is alert and oriented to person, place, and time.  Psychiatric:        Mood and Affect: Mood normal.        Behavior: Behavior normal.    ED Results / Procedures / Treatments   Labs (all labs ordered are listed, but only abnormal results are displayed) Labs Reviewed - No data to display  EKG None  Radiology 05/16/2021 Venous Img Upper Right (DVT Study)  Result Date: 06/02/2021 CLINICAL DATA:  Right arm pain and swelling. Right forearm IV removed 2 days ago. EXAM: RIGHT UPPER EXTREMITY VENOUS DOPPLER ULTRASOUND TECHNIQUE: Gray-scale sonography with graded compression, as well as color Doppler and duplex ultrasound were performed to evaluate the upper extremity deep venous system from the level of the subclavian vein and including the jugular, axillary, basilic, radial, ulnar and upper cephalic vein. Spectral Doppler was utilized to evaluate flow at rest and with distal augmentation maneuvers. COMPARISON:  None. FINDINGS: Contralateral Subclavian Vein: Respiratory phasicity is normal and symmetric with the symptomatic side. No evidence of thrombus. Normal compressibility. Internal Jugular Vein: No evidence of thrombus. Normal compressibility, respiratory phasicity and response to augmentation. Subclavian Vein: No evidence of thrombus. Normal compressibility, respiratory phasicity and response to augmentation. Axillary  Vein: No evidence of thrombus. Normal compressibility, respiratory phasicity and response to augmentation. Cephalic Vein: In the cephalic vein of the forearm, there is from bow CIS and lack of compressibility. The cephalic vein the upper arm through the elbow is widely patent and normally compressible. Basilic Vein: No evidence of thrombus. Normal compressibility, respiratory phasicity and response to augmentation. Brachial Veins: No evidence of thrombus. Normal compressibility, respiratory phasicity and response to augmentation. Radial Veins: No evidence of thrombus. Normal compressibility, respiratory phasicity and response to augmentation. Ulnar Veins: No evidence of thrombus. Normal compressibility, respiratory phasicity and response to augmentation. Venous Reflux:  None visualized. Other Findings:  None visualized. IMPRESSION: Occlusive thrombosis of the cephalic vein in the proximal to  distal forearm. No other venous thrombosis. Electronically Signed   By: Amie Portland M.D.   On: 06/02/2021 18:54    Procedures Procedures    Medications Ordered in ED Medications - No data to display  ED Course/ Medical Decision Making/ A&P                           Medical Decision Making Amount and/or Complexity of Data Reviewed ECG/medicine tests: ordered.   This patient presents to the ED for concern of right arm swelling, redness, with recent IV in the right arm secondary to cholecystitis and cholecystectomy, this involves an extensive number of treatment options, and is a complaint that carries with it a high risk of complications and morbidity. The emergent differential diagnosis prior to evaluation includes, but is not limited to, cellulitis, superficial thrombophlebitis, superficial venous thrombosis, or deep venous thrombosis.  Additional clinical consideration given to acute progression to pulmonary embolism.  Past Medical History / Co-morbidities: History of obesity, hyperlipidemia, as well as  recent cholecystectomy for cholecystitis  Additional history: Additional history obtained from patient's friend. External records from outside source obtained and reviewed including recent surgical procedure notes, urgent care visits, family med visits.  Physical Exam: Physical exam performed. The pertinent findings include: Redness, swelling, tenderness of the right forearm without purulent drainage, or signs of isolated infection.  Patient with no shortness of breath, tachycardia, chest pain, hemoptysis.  Other than her recent surgery, and the known superficial clot noted on exam, she has no clinical signs and symptoms to suggest that she would spontaneously develop pulmonary embolism from a superficial clot.   Imaging Studies: I ordered imaging studies including US of the RUE. I independently visualized and interpreted imaging which showed superficial venous thrombosis of the cephalic vein, no evidence of deep venous thrombosis. I agree with the radiologist interpretation.  Disposition: After consideration of the diagnostic results and the patients response to treatment, I feel that superficial venous thrombosis is consistent with patient's clinical presentation.  I had a long discussion with the patient and her friend about the usual expected clinical course of SV thrombosis including pain, swelling for 2 to 6 weeks, and encouraging NSAIDs, ice or heating pad, elevation of the affected extremity, as well as potentially some compression.  Encourage follow-up with PCP for recheck in 7 to 10 days.  Discussed that anticoagulation for superficial venous thrombosis is not clinically indicated, however there can be a risk for any thrombotic event for venous thromboembolism, and progression to pulmonary embolism.  Discussed I do not clinically believe that there is an additional overlying cellulitis in addition to the thrombophlebitis, however we discussed potential prophylactic antibiotic treatment given  patient and friends concern for additional problem going on.  After extensive discussion, and shared decision making patient declines additional antibiotic prophylaxis at this time.  Additionally we had a long discussion about the role of blood work in this clinical case.  Patient has no other signs or symptoms other than the right upper extremity swelling, discussed I do not think that blood work would help me to make clinical decision based on patient's complaint at this time.  Patient expresses understanding. Discussed extensive return precautions for worsening pain, redness, swelling, shortness of breath, hemoptysis, chest pain.   Patient discharged in stable condition at this time, all questions answered.   Final Clinical Impression(s) / ED Diagnoses Final diagnoses:  Superficial venous thrombosis of arm, right    Rx / DC  Orders ED Discharge Orders     None         Olene Flossrosperi, Clary Meeker H, PA-C 06/02/21 2008    West Balirosperi, Emara Lichter H, PA-C 06/02/21 2009    Vanetta MuldersZackowski, Scott, MD 06/02/21 2256

## 2021-06-07 ENCOUNTER — Encounter: Payer: Self-pay | Admitting: Physician Assistant

## 2021-06-07 ENCOUNTER — Other Ambulatory Visit: Payer: Self-pay

## 2021-06-07 ENCOUNTER — Ambulatory Visit: Payer: BC Managed Care – PPO | Admitting: Physician Assistant

## 2021-06-07 VITALS — BP 116/76 | HR 86 | Temp 97.9°F | Wt 241.9 lb

## 2021-06-07 DIAGNOSIS — M7989 Other specified soft tissue disorders: Secondary | ICD-10-CM | POA: Diagnosis not present

## 2021-06-07 DIAGNOSIS — Z09 Encounter for follow-up examination after completed treatment for conditions other than malignant neoplasm: Secondary | ICD-10-CM | POA: Diagnosis not present

## 2021-06-07 DIAGNOSIS — R238 Other skin changes: Secondary | ICD-10-CM | POA: Diagnosis not present

## 2021-06-07 NOTE — Progress Notes (Signed)
Established Patient Office Visit  Subjective:  Patient ID: Yolanda Tyler, female    DOB: 06/24/1985  Age: 36 y.o. MRN: [id number]  CC:  Chief Complaint  Patient presents with   ER follow up    HPI Yolanda Tyler presents for ER follow up on 06-02-20 Right arm swelling  Follow up ER visit  Patient was seen in ER for 06-02-20 She was treated for Right arm swelling. Pt has had some right arm swelling, redness, tightness since being discharged from recent cholecystectomy on 05-30-21 when IV was placed two days prior to ER visit.  Discharge treatment included: NSAIDs, ice or heating pad, compression She reports good compliance with treatment. She reports this condition is Improved but still painful to the touch with cont. swelling.   Pt reports surgical incision site is leaking yellow liquid. Per inquiry with surgery , She was told that it is normal for wound healing.  -----------------------------------------------------------------------------------------   Past Medical History:  Diagnosis Date   Allergy    Anxiety    Asthma    Depression    History of colposcopy    Mental disorder    depression and anxiety   Vaginal Pap smear, abnormal     Past Surgical History:  Procedure Laterality Date   BUNIONECTOMY     CHOLECYSTECTOMY N/A 05/30/2021   Procedure: LAPAROSCOPIC CHOLECYSTECTOMY WITH INTRAOPERATIVE CHOLANGIOGRAM;  Surgeon: Jovita Kussmaul, MD;  Location: [facility];  Service: General;  Laterality: N/A;   WISDOM TOOTH EXTRACTION      Family History  Problem Relation Age of Onset   Graves' disease Mother    Depression Father    Bone cancer Maternal Grandfather    Heart disease Paternal Grandmother     Social History   Socioeconomic History   Marital status: Soil scientist    Spouse name: Not on file   Number of children: Not on file   Years of education: Not on file   Highest education level: Not on file  Occupational History   Not on file  Tobacco Use    Smoking status: Never   Smokeless tobacco: Never  Vaping Use   Vaping Use: Never used  Substance and Sexual Activity   Alcohol use: Not Currently   Drug use: Not Currently   Sexual activity: Yes    Birth control/protection: None  Other Topics Concern   Not on file  Social History Narrative   Not on file   Social Determinants of Health   Financial Resource Strain: Not on file  Food Insecurity: Not on file  Transportation Needs: Not on file  Physical Activity: Not on file  Stress: Not on file  Social Connections: Not on file  Intimate Partner Violence: Not on file    Outpatient Medications Prior to Visit  Medication Sig Dispense Refill   acetaminophen (TYLENOL) 325 MG tablet Take 2 tablets (650 mg total) by mouth every 6 (six) hours as needed for mild pain.     albuterol (VENTOLIN HFA) 108 (90 Base) MCG/ACT inhaler INHALE 2 PUFFS BY MOUTH EVERY 6 HOURS AS NEEDED FOR WHEEZE OR SHORTNESS OF BREATH 18 each 0   buPROPion (WELLBUTRIN XL) 150 MG 24 hr tablet Take 150 mg by mouth daily.     Cholecalciferol ([employer]S VIT D 5000 HIGH-POTENCY PO) Take 5,000 Units by mouth at bedtime.     DULoxetine (CYMBALTA) 20 MG capsule Take 40 mg by mouth 2 (two) times daily.     ferrous sulfate 325 (65 FE)  MG EC tablet Take 1 tablet (325 mg total) by mouth 2 (two) times daily. 180 tablet 1   hydrocortisone 2.5 % lotion Apply 1 application topically daily as needed (rash).     ibuprofen (ADVIL) 600 MG tablet Take 1 tablet (600 mg total) by mouth every 6 (six) hours as needed for moderate pain. 30 tablet 0   ketoconazole (NIZORAL) 2 % shampoo Apply 1 application topically once a week.     lamoTRIgine (LAMICTAL) 200 MG tablet Take 200 mg by mouth daily.     metFORMIN (GLUCOPHAGE XR) 750 MG 24 hr tablet Take 1 tablet (750 mg total) by mouth daily with breakfast. (Patient taking differently: Take 750 mg by mouth 2 (two) times daily with a meal.) 90 tablet 0   methylphenidate (RITALIN) 20 MG tablet Take 20 mg by  mouth daily.     Multiple Vitamin (MULTIVITAMIN WITH MINERALS) TABS tablet Take 1 tablet by mouth at bedtime.     oxyCODONE (OXY IR/ROXICODONE) 5 MG immediate release tablet Take 1 tablet (5 mg total) by mouth every 6 (six) hours as needed for severe pain. 15 tablet 0   polyethylene glycol (MIRALAX / GLYCOLAX) 17 g packet Take 17 g by mouth daily as needed for mild constipation. 14 each 0   senna-docusate (SENOKOT-S) 8.6-50 MG tablet Take 1 tablet by mouth 2 (two) times daily between meals as needed for moderate constipation.  0   traZODone (DESYREL) 50 MG tablet Take 50-100 mg by mouth See admin instructions. Takes at bedtime. Dose depends on how she's feeling.     carboxymethylcellulose (REFRESH PLUS) 0.5 % SOLN 1 drop at bedtime as needed. (Patient not taking: Reported on 06/07/2021)     No facility-administered medications prior to visit.    Allergies  Allergen Reactions   Apple Itching and Swelling    Tolerates if cooked   Pear Itching and Swelling    Tolerates if cooked   Penicillins Hives   Clindamycin/Lincomycin Rash    ROS Review of Systems  Constitutional: Negative.   HENT: Negative.    Respiratory: Negative.    Cardiovascular: Negative.   Gastrointestinal: Negative.   Excejpt see HPI   Objective:    Physical Exam   General: Appearance:   in no acute distress  Eyes:    PERRL, conjunctiva/corneas clear, EOM's intact       Lungs:     Clear to auscultation bilaterally, respirations unlabored  Heart:    Normal rate and rhythm  MS:   Mild skin erythema, edema and warmth over the anterior aspect of the right forearm  Neurologic:   Awake, alert, oriented x 3. No apparent focal neurological defect.                          Skin :  Mild skin erythema, edema and warmth over anterior aspect of the right forearm, postsurgical laparoscopic incisions without signs of infection over the abdomen noted.  BP 116/76 (BP Location: Left Arm, Patient Position: Sitting, Cuff Size: Large)     Pulse 86    Temp 97.9 F (36.6 C) (Oral)    Wt 241 lb 14.4 oz (109.7 kg)    LMP 05/14/2021 (Approximate)    SpO2 97%    BMI 40.25 kg/m  Wt Readings from Last 3 Encounters:  06/07/21 241 lb 14.4 oz (109.7 kg)  06/02/21 245 lb (111.1 kg)  05/28/21 245 lb (111.1 kg)     Health Maintenance Due  Topic Date Due   COVID-19 Vaccine (4 - Booster for Pfizer series) 03/25/2020   INFLUENZA VACCINE  10/30/2020    There are no preventive care reminders to display for this patient.  Lab Results  Component Value Date   TSH 1.370 12/29/2019   Lab Results  Component Value Date   WBC 14.1 (H) 05/31/2021   HGB 10.7 (L) 05/31/2021   HCT 33.6 (L) 05/31/2021   MCV 89.4 05/31/2021   PLT 302 05/31/2021   Lab Results  Component Value Date   NA 136 05/31/2021   K 3.8 05/31/2021   CO2 25 05/31/2021   GLUCOSE 124 (H) 05/31/2021   BUN 7 05/31/2021   CREATININE 0.56 05/31/2021   BILITOT 0.2 (L) 05/31/2021   ALKPHOS 67 05/31/2021   AST 29 05/31/2021   ALT 151 (H) 05/31/2021   PROT 6.0 (L) 05/31/2021   ALBUMIN 3.2 (L) 05/31/2021   CALCIUM 8.2 (L) 05/31/2021   ANIONGAP 6 05/31/2021   EGFR 95 04/04/2021   Lab Results  Component Value Date   CHOL 207 (H) 04/04/2021   Lab Results  Component Value Date   HDL 72 04/04/2021   Lab Results  Component Value Date   LDLCALC 120 (H) 04/04/2021   Lab Results  Component Value Date   TRIG 81 05/30/2021   Lab Results  Component Value Date   CHOLHDL 2.9 04/04/2021   Lab Results  Component Value Date   HGBA1C 5.7 (H) 04/04/2021      Assessment & Plan:   Problem List Items Addressed This Visit   None Visit Diagnoses     Redness and swelling of upper arm    -  Primary   Relevant Orders   CBC with Differential/Platelet   Comprehensive metabolic panel   Follow-up examination after gastrointestinal surgery          1. Redness and swelling of upper arm Post IV injection in hospital - Per patient, she is improving - CBC with  Differential/Platelet - Comprehensive metabolic panel  2. Follow-up examination after gastrointestinal surgery - Per patient, she is improving - CBC with Differential/Platelet - Comprehensive metabolic panel - Lenetta Quaker, a CMA, took care of patient's periumbilical postsurgical incision  The patient was advised to call back or seek an in-person evaluation if the symptoms worsen or if the condition fails to improve as anticipated.  I discussed the assessment and treatment plan with the patient. The patient was provided an opportunity to ask questions and all were answered. The patient agreed with the plan and demonstrated an understanding of the instructions.  Follow-up: in 7 days and afterwards, patient will continue with Tally Joe, her primary provider.   I,Javonne Dorko Robinson,acting as a Education administrator for [employer], PA-C.,have documented all relevant documentation on the behalf of Mardene Speak, PA-C,as directed by  [employer], PA-C while in the presence of [employer], PA-C.   The entirety of the information documented in the History of Present Illness, Review of Systems and Physical Exam were personally obtained by me. Portions of this information were initially documented by the CMA and reviewed by me for thoroughness and accuracy.    Mardene Speak, PA-C [facility] [address] [place], [place], [address] Office: [phone] Fax: [phone]

## 2021-06-08 LAB — CBC WITH DIFFERENTIAL/PLATELET
Basophils Absolute: 0.1 10*3/uL (ref 0.0–0.2)
Basos: 1 %
EOS (ABSOLUTE): 0.2 10*3/uL (ref 0.0–0.4)
Eos: 2 %
Hematocrit: 39.8 % (ref 34.0–46.6)
Hemoglobin: 12.9 g/dL (ref 11.1–15.9)
Immature Grans (Abs): 0 10*3/uL (ref 0.0–0.1)
Immature Granulocytes: 1 %
Lymphocytes Absolute: 2.4 10*3/uL (ref 0.7–3.1)
Lymphs: 31 %
MCH: 28.3 pg (ref 26.6–33.0)
MCHC: 32.4 g/dL (ref 31.5–35.7)
MCV: 87 fL (ref 79–97)
Monocytes Absolute: 0.7 10*3/uL (ref 0.1–0.9)
Monocytes: 10 %
Neutrophils Absolute: 4.2 10*3/uL (ref 1.4–7.0)
Neutrophils: 55 %
Platelets: 378 10*3/uL (ref 150–450)
RBC: 4.56 x10E6/uL (ref 3.77–5.28)
RDW: 13.1 % (ref 11.7–15.4)
WBC: 7.5 10*3/uL (ref 3.4–10.8)

## 2021-06-08 LAB — COMPREHENSIVE METABOLIC PANEL
ALT: 42 IU/L — ABNORMAL HIGH (ref 0–32)
AST: 17 IU/L (ref 0–40)
Albumin/Globulin Ratio: 2.2 (ref 1.2–2.2)
Albumin: 5 g/dL — ABNORMAL HIGH (ref 3.8–4.8)
Alkaline Phosphatase: 92 IU/L (ref 44–121)
BUN/Creatinine Ratio: 18 (ref 9–23)
BUN: 14 mg/dL (ref 6–20)
Bilirubin Total: 0.3 mg/dL (ref 0.0–1.2)
CO2: 22 mmol/L (ref 20–29)
Calcium: 9.9 mg/dL (ref 8.7–10.2)
Chloride: 100 mmol/L (ref 96–106)
Creatinine, Ser: 0.8 mg/dL (ref 0.57–1.00)
Globulin, Total: 2.3 g/dL (ref 1.5–4.5)
Glucose: 90 mg/dL (ref 70–99)
Potassium: 4.6 mmol/L (ref 3.5–5.2)
Sodium: 137 mmol/L (ref 134–144)
Total Protein: 7.3 g/dL (ref 6.0–8.5)
eGFR: 99 mL/min/{1.73_m2} (ref >59)

## 2021-06-10 ENCOUNTER — Encounter: Payer: Self-pay | Admitting: Emergency Medicine

## 2021-06-10 ENCOUNTER — Ambulatory Visit
Admission: EM | Admit: 2021-06-10 | Discharge: 2021-06-10 | Disposition: A | Discharge status: Home / Self Care | Payer: BC Managed Care – PPO | Attending: Emergency Medicine | Admitting: Emergency Medicine

## 2021-06-10 DIAGNOSIS — T8149XA Infection following a procedure, other surgical site, initial encounter: Secondary | ICD-10-CM

## 2021-06-10 DIAGNOSIS — T8131XA Disruption of external operation (surgical) wound, not elsewhere classified, initial encounter: Secondary | ICD-10-CM

## 2021-06-10 MED ORDER — DOXYCYCLINE HYCLATE 100 MG PO CAPS: 100.0000 mg | ORAL_CAPSULE | Freq: Two times a day (BID) | ORAL | 0 refills | Status: AC

## 2021-06-10 NOTE — Discharge Instructions (Addendum)
Take the doxycycline as directed.  Follow up with your surgeon on Monday.  Go to the emergency department if your symptoms worsen.   ? ? ?

## 2021-06-10 NOTE — ED Triage Notes (Signed)
Pt here post cholecystectomy last week with dehiscence of navel incision since yesterday. Pt has not contacted her surgeon and states that keeping it covered irritates it more.  ?

## 2021-06-10 NOTE — ED Provider Notes (Signed)
Yolanda Tyler    CSN: [id number] Arrival date & time: 06/10/21  1404      History   Chief Complaint Chief Complaint  Patient presents with   Wound Dehiscence    HPI Yolanda Tyler is a 36 y.o. female.  Patient presents with drainage from a surgical wound for several days.  The drainage was thin and yellow but became bloody today. The skin around the wound is red.  No fever, abdominal pain, or other symptoms.  She had a laparoscopic cholecystectomy on 05/30/2021.  She then was seen at [facility] ED on 06/02/2021 for superficial venous thrombosis of right arm.  She contacted her surgeon's office on 06/05/2021 with concern that her incision had opened and was draining.  She was seen at [facility] family practice on 06/07/2021 for follow-up of her surgery and right arm thrombosis.  The history is provided by the patient and medical records.   Past Medical History:  Diagnosis Date   Allergy    Anxiety    Asthma    Depression    History of colposcopy    Mental disorder    depression and anxiety   Vaginal Pap smear, abnormal     Patient Active Problem List   Diagnosis Date Noted   Leukocytosis 05/31/2021   Elevated liver enzymes 05/30/2021   Iron deficiency anemia 05/30/2021   Cholelithiasis 05/29/2021   Acute biliary pancreatitis 05/28/2021   Chronic asthma    Annual physical exam 04/04/2021   Bruises easily 04/04/2021   Anxiety and depression 09/18/2020   Chronic idiopathic constipation 09/18/2020   Avitaminosis D 09/18/2020   Prediabetes 02/03/2020   Hyperlipidemia 02/03/2020   Elevated BP without diagnosis of hypertension 02/03/2020   Morbid obesity (Person) 12/29/2019   Birth control counseling 05/06/2018    Past Surgical History:  Procedure Laterality Date   BUNIONECTOMY     CHOLECYSTECTOMY N/A 05/30/2021   Procedure: LAPAROSCOPIC CHOLECYSTECTOMY WITH INTRAOPERATIVE CHOLANGIOGRAM;  Surgeon: [name], MD;  Location: [facility];  Service: General;  Laterality: N/A;    WISDOM TOOTH EXTRACTION      OB History     Gravida  0   Para  0   Term  0   Preterm  0   AB  0   Living  0      SAB  0   IAB  0   Ectopic  0   Multiple  0   Live Births  0            Home Medications    Prior to Admission medications   Medication Sig Start Date End Date Taking? Authorizing Provider  doxycycline (VIBRAMYCIN) 100 MG capsule Take 1 capsule (100 mg total) by mouth 2 (two) times daily for 7 days. 06/10/21 06/17/21 Yes Sharion Balloon, NP  acetaminophen (TYLENOL) 325 MG tablet Take 2 tablets (650 mg total) by mouth every 6 (six) hours as needed for mild pain. 05/31/21   Meuth, Brooke A, PA-C  albuterol (VENTOLIN HFA) 108 (90 Base) MCG/ACT inhaler INHALE 2 PUFFS BY MOUTH EVERY 6 HOURS AS NEEDED FOR WHEEZE OR SHORTNESS OF BREATH 05/03/21   Tally Joe T, FNP  buPROPion (WELLBUTRIN XL) 150 MG 24 hr tablet Take 150 mg by mouth daily. 12/19/20   [provider]  carboxymethylcellulose (REFRESH PLUS) 0.5 % SOLN 1 drop at bedtime as needed. Patient not taking: Reported on 06/07/2021    [provider]  Cholecalciferol (CVS VIT D 5000 HIGH-POTENCY PO) Take 5,000  Units by mouth at bedtime.    [provider]  DULoxetine (CYMBALTA) 20 MG capsule Take 40 mg by mouth 2 (two) times daily. 05/22/21   [provider]  ferrous sulfate 325 (65 FE) MG EC tablet Take 1 tablet (325 mg total) by mouth 2 (two) times daily. 05/31/21 11/27/21  Mercy Riding, MD  hydrocortisone 2.5 % lotion Apply 1 application topically daily as needed (rash). 07/01/20   [provider]  ibuprofen (ADVIL) 600 MG tablet Take 1 tablet (600 mg total) by mouth every 6 (six) hours as needed for moderate pain. 05/31/21   Meuth, Brooke A, PA-C  ketoconazole (NIZORAL) 2 % shampoo Apply 1 application topically once a week. 08/06/20   [provider]  lamoTRIgine (LAMICTAL) 200 MG tablet Take 200 mg by mouth daily. 03/20/21   [provider]  metFORMIN  (GLUCOPHAGE XR) 750 MG 24 hr tablet Take 1 tablet (750 mg total) by mouth daily with breakfast. Patient taking differently: Take 750 mg by mouth 2 (two) times daily with a meal. 04/10/21   Gwyneth Sprout, FNP  methylphenidate (RITALIN) 20 MG tablet Take 20 mg by mouth daily. 01/28/21   [provider]  Multiple Vitamin (MULTIVITAMIN WITH MINERALS) TABS tablet Take 1 tablet by mouth at bedtime.    [provider]  oxyCODONE (OXY IR/ROXICODONE) 5 MG immediate release tablet Take 1 tablet (5 mg total) by mouth every 6 (six) hours as needed for severe pain. 05/31/21   Meuth, Brooke A, PA-C  polyethylene glycol (MIRALAX / GLYCOLAX) 17 g packet Take 17 g by mouth daily as needed for mild constipation. 05/31/21   Meuth, Brooke A, PA-C  senna-docusate (SENOKOT-S) 8.6-50 MG tablet Take 1 tablet by mouth 2 (two) times daily between meals as needed for moderate constipation. 05/31/21   Mercy Riding, MD  traZODone (DESYREL) 50 MG tablet Take 50-100 mg by mouth See admin instructions. Takes at bedtime. Dose depends on how she's feeling.    [provider]    Family History Family History  Problem Relation Age of Onset   Graves' disease Mother    Depression Father    Bone cancer Maternal Grandfather    Heart disease Paternal Grandmother     Social History Social History   Tobacco Use   Smoking status: Never   Smokeless tobacco: Never  Vaping Use   Vaping Use: Never used  Substance Use Topics   Alcohol use: Not Currently   Drug use: Not Currently     Allergies   Apple, Pear, Penicillins, and Clindamycin/lincomycin   Review of Systems Review of Systems  Constitutional:  Negative for chills and fever.  Gastrointestinal:  Negative for abdominal pain, nausea and vomiting.  Skin:  Positive for color change and wound.  All other systems reviewed and are negative.   Physical Exam Triage Vital Signs ED Triage Vitals  Enc Vitals Group     BP 06/10/21 1415 132/83      Pulse Rate 06/10/21 1415 88     Resp 06/10/21 1415 18     Temp 06/10/21 1415 98.4 F (36.9 C)     Temp src --      SpO2 06/10/21 1415 98 %     Weight --      Height --      Head Circumference --      Peak Flow --      Pain Score 06/10/21 1413 3     Pain Loc --  Pain Edu? --      Excl. in [place]? --    No data found.  Updated Vital Signs BP 132/83    Pulse 88    Temp 98.4 F (36.9 C)    Resp 18    LMP 05/14/2021 (Approximate)    SpO2 98%   Visual Acuity Right Eye Distance:   Left Eye Distance:   Bilateral Distance:    Right Eye Near:   Left Eye Near:    Bilateral Near:     Physical Exam Vitals and nursing note reviewed.  Constitutional:      General: She is not in acute distress.    Appearance: She is well-developed.  HENT:     Mouth/Throat:     Mouth: Mucous membranes are moist.  Cardiovascular:     Rate and Rhythm: Normal rate and regular rhythm.     Heart sounds: Normal heart sounds.  Pulmonary:     Effort: Pulmonary effort is normal. No respiratory distress.     Breath sounds: Normal breath sounds.  Abdominal:     General: Bowel sounds are normal.     Palpations: Abdomen is soft.     Tenderness: There is no abdominal tenderness.  Musculoskeletal:     Cervical back: Neck supple.  Skin:    General: Skin is warm and dry.     Findings: Erythema and lesion present.     Comments: Open surgical wound with scant bloody drainage. See pictures.   Neurological:     Mental Status: She is alert.  Psychiatric:        Mood and Affect: Mood normal.        Behavior: Behavior normal.        UC Treatments / Results  Labs (all labs ordered are listed, but only abnormal results are displayed) Labs Reviewed - No data to display  EKG   Radiology No results found.  Procedures Procedures (including critical care time)  Medications Ordered in UC Medications - No data to display  Initial Impression / Assessment and Plan / UC Course  I have reviewed the  triage vital signs and the nursing notes.  Pertinent labs & imaging results that were available during my care of the patient were reviewed by me and considered in my medical decision making (see chart for details).  Surgical wound dehiscence and infection.  Treating with doxycycline.  Instructed patient to keep the area covered with nonadherent dressing.  Instructed her to call her surgeon's office today to discuss with her surgeon.  Strict ED precautions discussed.  She agrees to plan of care.    Final Clinical Impressions(s) / UC Diagnoses   Final diagnoses:  Surgical wound dehiscence, initial encounter  Surgical wound infection     Discharge Instructions      Take the doxycycline as directed.  Follow up with your surgeon on Monday.  Go to the emergency department if your symptoms worsen.         ED Prescriptions     Medication Sig Dispense Auth. Provider   doxycycline (VIBRAMYCIN) 100 MG capsule Take 1 capsule (100 mg total) by mouth 2 (two) times daily for 7 days. 14 capsule Sharion Balloon, NP      PDMP not reviewed this encounter.   Sharion Balloon, NP 06/10/21 1505

## 2021-06-14 ENCOUNTER — Encounter: Payer: Self-pay | Admitting: Physician Assistant

## 2021-06-14 ENCOUNTER — Other Ambulatory Visit: Payer: Self-pay

## 2021-06-14 ENCOUNTER — Ambulatory Visit: Payer: BC Managed Care – PPO | Admitting: Physician Assistant

## 2021-06-14 VITALS — BP 132/69 | HR 92 | Resp 16 | Wt 242.2 lb

## 2021-06-14 DIAGNOSIS — R238 Other skin changes: Secondary | ICD-10-CM | POA: Diagnosis not present

## 2021-06-14 DIAGNOSIS — M7989 Other specified soft tissue disorders: Secondary | ICD-10-CM

## 2021-06-14 DIAGNOSIS — R7303 Prediabetes: Secondary | ICD-10-CM | POA: Diagnosis not present

## 2021-06-14 DIAGNOSIS — Z09 Encounter for follow-up examination after completed treatment for conditions other than malignant neoplasm: Secondary | ICD-10-CM | POA: Diagnosis not present

## 2021-06-14 MED ORDER — METFORMIN HCL ER 750 MG PO TB24: 750.0000 mg | ORAL_TABLET | Freq: Two times a day (BID) | ORAL | 0 refills | Status: DC

## 2021-06-14 NOTE — Progress Notes (Signed)
?  ? ? ?Established patient visit ? ? ?Patient: Yolanda Tyler   DOB: Jan 06, 1986   36 y.o. Female  MRN: [id number] ?Visit Date: 06/14/2021 ? ?Today's healthcare provider: Debera Lat, PA-C  ? ?Chief Complaint  ?Patient presents with  ? Follow-up  ? ?Subjective  ?  ?HPI  ?Follow up for redness and swelling of upper arm ? ?The patient was last seen for this 1 weeks ago. ?Changes made at last visit include labs ordered. US Venous Upper Right Occlusive thrombosis of the cephalic vein in the proximal to distal ?forearm. No other venous thrombosis ? ?She reports good compliance with treatment. ?She feels that condition is Improved. ?She is not having side effects. ? ?Denies having wound drainage, redness, fever abdominal pain or other symptoms. ?Had a course of antibiotics per her visit to ED on Sunday. Per previous visit, patient was advised to go to ED if symptoms worsen. ? ?-----------------------------------------------------------------------------------------  ? ?Medications: ?Outpatient Medications Prior to Visit  ?Medication Sig  ? acetaminophen (TYLENOL) 325 MG tablet Take 2 tablets (650 mg total) by mouth every 6 (six) hours as needed for mild pain.  ? albuterol (VENTOLIN HFA) 108 (90 Base) MCG/ACT inhaler INHALE 2 PUFFS BY MOUTH EVERY 6 HOURS AS NEEDED FOR WHEEZE OR SHORTNESS OF BREATH  ? buPROPion (WELLBUTRIN XL) 150 MG 24 hr tablet Take 150 mg by mouth daily.  ? Cholecalciferol (CVS VIT D 5000 HIGH-POTENCY PO) Take 5,000 Units by mouth at bedtime.  ? doxycycline (VIBRAMYCIN) 100 MG capsule Take 1 capsule (100 mg total) by mouth 2 (two) times daily for 7 days.  ? DULoxetine (CYMBALTA) 20 MG capsule Take 40 mg by mouth 2 (two) times daily.  ? ferrous sulfate 325 (65 FE) MG EC tablet Take 1 tablet (325 mg total) by mouth 2 (two) times daily.  ? hydrocortisone 2.5 % lotion Apply 1 application topically daily as needed (rash).  ? ibuprofen (ADVIL) 600 MG tablet Take 1 tablet (600 mg total) by mouth every 6 (six)  hours as needed for moderate pain.  ? ketoconazole (NIZORAL) 2 % shampoo Apply 1 application topically once a week.  ? lamoTRIgine (LAMICTAL) 200 MG tablet Take 200 mg by mouth daily.  ? metFORMIN (GLUCOPHAGE XR) 750 MG 24 hr tablet Take 1 tablet (750 mg total) by mouth daily with breakfast. (Patient taking differently: Take 750 mg by mouth 2 (two) times daily with a meal.)  ? methylphenidate (RITALIN) 20 MG tablet Take 20 mg by mouth daily.  ? Multiple Vitamin (MULTIVITAMIN WITH MINERALS) TABS tablet Take 1 tablet by mouth at bedtime.  ? oxyCODONE (OXY IR/ROXICODONE) 5 MG immediate release tablet Take 1 tablet (5 mg total) by mouth every 6 (six) hours as needed for severe pain.  ? polyethylene glycol (MIRALAX / GLYCOLAX) 17 g packet Take 17 g by mouth daily as needed for mild constipation.  ? senna-docusate (SENOKOT-S) 8.6-50 MG tablet Take 1 tablet by mouth 2 (two) times daily between meals as needed for moderate constipation.  ? traZODone (DESYREL) 50 MG tablet Take 50-100 mg by mouth See admin instructions. Takes at bedtime. Dose depends on how she's feeling.  ? carboxymethylcellulose (REFRESH PLUS) 0.5 % SOLN 1 drop at bedtime as needed. (Patient not taking: Reported on 06/14/2021)  ? ?No facility-administered medications prior to visit.  ? ? ?Review of Systems  ?Constitutional: Negative.   ?Respiratory: Negative.    ?Cardiovascular: Negative.   ?Gastrointestinal: Negative.   ?Musculoskeletal: Negative.   ?Skin: Negative.   ?Psychiatric/Behavioral:  Negative.    ? ? ?  Objective  ?  ?BP 132/69   Pulse 92   Resp 16   Wt 242 lb 3.2 oz (109.9 kg)   SpO2 100%   BMI 40.30 kg/m?  ? ? ?Physical Exam ?Constitutional:   ?   Appearance: Normal appearance. She is obese.  ?HENT:  ?   Head: Normocephalic and atraumatic.  ?Cardiovascular:  ?   Rate and Rhythm: Normal rate.  ?   Pulses: Normal pulses.  ?Pulmonary:  ?   Effort: Pulmonary effort is normal.  ?Abdominal:  ?   General: Abdomen is flat. Bowel sounds are normal.   ?   Palpations: Abdomen is soft.  ?Skin: ?   General: Skin is warm.  ?   Findings: No bruising, erythema, lesion or rash.  ?   Comments: the surgical area covered with nonadherent dressing, no drainage noticed on dressing, no erythema, drainage or infection noticed around the other laparoscopic incisions   ?Neurological:  ?   Mental Status: She is alert.  ?  ? ? Assessment & Plan  ?  ? ?   ?1. Prediabetes ?Last a1C was 5.7 2 mo ago ?Requested a med refill for metformin for 2 weeks. Has been taking 1.5 tab twice daily to control her symptoms. No side effects with increased dose. ?- metFORMIN (GLUCOPHAGE XR) 750 MG 24 hr tablet; Take 1 tablet (750 mg total) by mouth 2 (two) times daily with a meal.  Dispense: 30 tablet; Refill: 0 ? ?2. Redness and swelling of upper arm ?resolved ? ?3. Follow-up examination after gastrointestinal surgery ?Wounds healing well. Finished a course of antibiotics, per ED provider. ? ? FU with Merita Norton, her PCP ?  ?The patient was advised to call back or seek an in-person evaluation if the symptoms worsen or if the condition fails to improve as anticipated. ? ?I discussed the assessment and treatment plan with the patient. The patient was provided an opportunity to ask questions and all were answered. The patient agreed with the plan and demonstrated an understanding of the instructions. ? ?The entirety of the information documented in the History of Present Illness, Review of Systems and Physical Exam were personally obtained by me. Portions of this information were initially documented by the CMA and reviewed by me for thoroughness and accuracy.   ? ?[employer] as a Neurosurgeon for [employer], PA-C.,have documented all relevant documentation on the behalf of Debera Lat, PA-C,as directed by  [employer], PA-C while in the presence of [employer], PA-C.  ? ?Debera Lat, PA-C  ?[facility] ?[phone] (phone) ?[phone] (fax) ? ?[facility] ?

## 2021-06-25 ENCOUNTER — Other Ambulatory Visit: Payer: Self-pay | Admitting: Physician Assistant

## 2021-06-25 DIAGNOSIS — R7303 Prediabetes: Secondary | ICD-10-CM

## 2021-07-05 ENCOUNTER — Other Ambulatory Visit: Payer: Self-pay | Admitting: Physician Assistant

## 2021-07-05 DIAGNOSIS — R7303 Prediabetes: Secondary | ICD-10-CM

## 2021-07-06 ENCOUNTER — Telehealth: Payer: Self-pay | Admitting: *Deleted

## 2021-07-06 NOTE — Telephone Encounter (Signed)
Received a duplicate refill request for the Metformin ER 750 mg.   Called [facility] [phone] spoke with pharmacist.   Reason for duplicate was no refills remained.   Message sent to [facility] for refill. ?

## 2021-07-06 NOTE — Telephone Encounter (Signed)
Requested medication (s) are due for refill today:   Yes   No refills remain ? ?Requested medication (s) are on the active medication list:   Yes ? ?Future visit scheduled:   Yes in 1 wk with Tally Joe 07/16/2021 ? ? ?Last ordered: 06/26/2021 #30, 0 refills ? ?Returned because per Ostwalt's note pt is taking 1.5 tablets twice a day and doing ok.   Instructed to f/u with Tally Joe.  LOV 06/14/2021.  No refills remain ? ?Requested Prescriptions  ?Pending Prescriptions Disp Refills  ? metFORMIN (GLUCOPHAGE-XR) 750 MG 24 hr tablet [Pharmacy Med Name: METFORMIN HCL ER 750 MG TABLET] 30 tablet 0  ?  Sig: TAKE 1 TABLET (750 MG TOTAL) BY MOUTH 2 (TWO) TIMES DAILY WITH A MEAL.  ?  ? Endocrinology:  Diabetes - Biguanides Passed - 07/05/2021 11:34 AM  ?  ?  Passed - Cr in normal range and within 360 days  ?  Creatinine, Ser  ?Date Value Ref Range Status  ?06/07/2021 0.80 0.57 - 1.00 mg/dL Final  ?  ?  ?  ?  Passed - HBA1C is between 0 and 7.9 and within 180 days  ?  Hgb A1c MFr Bld  ?Date Value Ref Range Status  ?04/04/2021 5.7 (H) 4.8 - 5.6 % Final  ?  Comment:  ?           Prediabetes: 5.7 - 6.4 ?         Diabetes: >6.4 ?         Glycemic control for adults with diabetes: <7.0 ?  ?  ?  ?  ?  Passed - eGFR in normal range and within 360 days  ?  GFR calc Af Amer  ?Date Value Ref Range Status  ?12/29/2019 116 >59 mL/min/1.73 Final  ?  Comment:  ?  **[facility] currently reports eGFR in compliance with the current** ?  recommendations of the [employer]. [facility] will ?  update reporting as new guidelines are published from the NKF-ASN ?  Task force. ?  ? ?GFR, Estimated  ?Date Value Ref Range Status  ?05/31/2021 >60 >60 mL/min Final  ?  Comment:  ?  (NOTE) ?Calculated using the CKD-EPI Creatinine Equation (2021) ?  ? ?eGFR  ?Date Value Ref Range Status  ?06/07/2021 99 >59 mL/min/1.73 Final  ?  ?  ?  ?  Passed - B12 Level in normal range and within 720 days  ?  Vitamin B-12  ?Date Value Ref Range Status  ?05/31/2021  245 180 - 914 pg/mL Final  ?  Comment:  ?  (NOTE) ?This assay is not validated for testing neonatal or ?myeloproliferative syndrome specimens for Vitamin B12 levels. ?Performed at [facility], [address] ?[place], [place] [address] ?  ?  ?  ?  ?  Passed - Valid encounter within last 6 months  ?  Recent Outpatient Visits   ? ?      ? 3 weeks ago Prediabetes  ? [facility], [place], [place]  ? 4 weeks ago Redness and swelling of upper arm  ? [facility] [place], Mar[place], PA-C  ? 3 months ago Annual physical exam  ? [facility] Gwyneth Sprout, FNP  ? 9 months ago Prediabetes  ? [facility] [place], Dionne Bucy, MD  ? 1 year ago Prediabetes  ? [facility] [place], [place], [place]  ? ?  ?  ?Future Appointments   ? ?        ?  In 1 week Gwyneth Sprout, [place], [facility]  ? ?  ? ?  ?  ?  Passed - CBC within normal limits and completed in the last 12 months  ?  WBC  ?Date Value Ref Range Status  ?06/07/2021 7.5 3.4 - 10.8 x10E3/uL Final  ?05/31/2021 14.1 (H) 4.0 - 10.5 K/uL Final  ? ?RBC  ?Date Value Ref Range Status  ?06/07/2021 4.56 3.77 - 5.28 x10E6/uL Final  ?05/31/2021 3.76 (L) 3.87 - 5.11 MIL/uL Final  ? ?RBC.  ?Date Value Ref Range Status  ?05/31/2021 3.69 (L) 3.87 - 5.11 MIL/uL Final  ? ?Hemoglobin  ?Date Value Ref Range Status  ?06/07/2021 12.9 11.1 - 15.9 g/dL Final  ? ?Hematocrit  ?Date Value Ref Range Status  ?06/07/2021 39.8 34.0 - 46.6 % Final  ? ?MCHC  ?Date Value Ref Range Status  ?06/07/2021 32.4 31.5 - 35.7 g/dL Final  ?05/31/2021 31.8 30.0 - 36.0 g/dL Final  ? ?MCH  ?Date Value Ref Range Status  ?06/07/2021 28.3 26.6 - 33.0 pg Final  ?05/31/2021 28.5 26.0 - 34.0 pg Final  ? ?MCV  ?Date Value Ref Range Status  ?06/07/2021 87 79 - 97 fL Final  ? ?No results found for: PLTCOUNTKUC, LABPLAT, [place] ?RDW  ?Date Value Ref Range Status  ?06/07/2021 13.1 11.7 - 15.4 % Final  ? ?  ?  ?  ? ?

## 2021-07-13 NOTE — Progress Notes (Signed)
?  ?[employer] as a Neurosurgeon for Jacky Kindle, FNP.,have documented all relevant documentation on the behalf of Jacky Kindle, FNP,as directed by  Jacky Kindle, FNP while in the presence of Jacky Kindle, FNP.  ? ?Established patient visit ? ? ?Patient: Yolanda Tyler   DOB: 15-May-1985   36 y.o. Female  MRN: [id number] ?Visit Date: 07/16/2021 ? ?Today's healthcare provider: Jacky Kindle, FNP  ? ?Re Introduced to nurse practitioner role and practice setting.  All questions answered.  Discussed provider/patient relationship and expectations. ? ?Chief Complaint  ?Patient presents with  ? Prediabetes  ? ?Subjective  ?  ?HPI  ?Prediabetes, Follow-up ? ?Lab Results  ?Component Value Date  ? HGBA1C 5.7 (H) 04/04/2021  ? HGBA1C 5.5 09/18/2020  ? HGBA1C 5.8 (A) 03/08/2020  ? GLUCOSE 90 06/07/2021  ? GLUCOSE 124 (H) 05/31/2021  ? GLUCOSE 107 (H) 05/30/2021  ? ? ?Last seen for for this1 months ago.  ?Management since that visit includes none. ?Current symptoms include none and have been unchanged. ? ?Prior visit with dietician: yes -  ?Current diet: well balanced ?Current exercise: cardiovascular workout on exercise equipment and walking ? ?Pertinent Labs: ?   ?Component Value Date/Time  ? CHOL 207 (H) 04/04/2021 1013  ? TRIG 81 05/30/2021 0527  ? CHOLHDL 2.9 04/04/2021 1013  ? CREATININE 0.80 06/07/2021 1021  ? ? ?Wt Readings from Last 3 Encounters:  ?07/16/21 247 lb 1.6 oz (112.1 kg)  ?06/14/21 242 lb 3.2 oz (109.9 kg)  ?06/07/21 241 lb 14.4 oz (109.7 kg)  ? ? ?-----------------------------------------------------------------------------------------  ? ?Medications: ?Outpatient Medications Prior to Visit  ?Medication Sig  ? albuterol (VENTOLIN HFA) 108 (90 Base) MCG/ACT inhaler INHALE 2 PUFFS BY MOUTH EVERY 6 HOURS AS NEEDED FOR WHEEZE OR SHORTNESS OF BREATH  ? buPROPion (WELLBUTRIN XL) 150 MG 24 hr tablet Take 150 mg by mouth daily.  ? carboxymethylcellulose (REFRESH PLUS) 0.5 % SOLN 1 drop at bedtime as  needed.  ? Cholecalciferol ([facility] VIT D 5000 HIGH-POTENCY PO) Take 5,000 Units by mouth at bedtime.  ? DULoxetine (CYMBALTA) 20 MG capsule Take 40 mg by mouth 2 (two) times daily.  ? ferrous sulfate 325 (65 FE) MG EC tablet Take 1 tablet (325 mg total) by mouth 2 (two) times daily.  ? lamoTRIgine (LAMICTAL) 200 MG tablet Take 200 mg by mouth daily.  ? methylphenidate (RITALIN LA) 30 MG 24 hr capsule Take 30 mg by mouth daily.  ? Multiple Vitamin (MULTIVITAMIN WITH MINERALS) TABS tablet Take 1 tablet by mouth at bedtime.  ? polyethylene glycol (MIRALAX / GLYCOLAX) 17 g packet Take 17 g by mouth daily as needed for mild constipation.  ? senna-docusate (SENOKOT-S) 8.6-50 MG tablet Take 1 tablet by mouth 2 (two) times daily between meals as needed for moderate constipation.  ? traZODone (DESYREL) 50 MG tablet Take 50-100 mg by mouth See admin instructions. Takes at bedtime. Dose depends on how she's feeling.  ? [DISCONTINUED] hydrocortisone 2.5 % lotion Apply 1 application topically daily as needed (rash).  ? [DISCONTINUED] ketoconazole (NIZORAL) 2 % shampoo Apply 1 application topically once a week.  ? [DISCONTINUED] metFORMIN (GLUCOPHAGE-XR) 750 MG 24 hr tablet TAKE 1 TABLET (750 MG TOTAL) BY MOUTH 2 (TWO) TIMES DAILY WITH A MEAL.  ? [DISCONTINUED] methylphenidate (RITALIN) 20 MG tablet Take 20 mg by mouth daily.  ? [DISCONTINUED] acetaminophen (TYLENOL) 325 MG tablet Take 2 tablets (650 mg total) by mouth every 6 (six) hours as needed  for mild pain.  ? [DISCONTINUED] ibuprofen (ADVIL) 600 MG tablet Take 1 tablet (600 mg total) by mouth every 6 (six) hours as needed for moderate pain.  ? [DISCONTINUED] oxyCODONE (OXY IR/ROXICODONE) 5 MG immediate release tablet Take 1 tablet (5 mg total) by mouth every 6 (six) hours as needed for severe pain.  ? ?No facility-administered medications prior to visit.  ? ? ?Review of Systems ? ? ?  Objective  ?  ?BP 127/86   Pulse 86   Temp 97.7 ?F (36.5 ?C) (Oral)   Resp 16   Wt 247  lb 1.6 oz (112.1 kg)   BMI 41.12 kg/m?  ? ? ?Physical Exam ?Vitals and nursing note reviewed.  ?Constitutional:   ?   General: She is not in acute distress. ?   Appearance: Normal appearance. She is obese. She is not ill-appearing, toxic-appearing or diaphoretic.  ?HENT:  ?   Head: Normocephalic and atraumatic.  ?Cardiovascular:  ?   Rate and Rhythm: Normal rate and regular rhythm.  ?   Pulses: Normal pulses.  ?   Heart sounds: Normal heart sounds. No murmur heard. ?  No friction rub. No gallop.  ?Pulmonary:  ?   Effort: Pulmonary effort is normal. No respiratory distress.  ?   Breath sounds: Normal breath sounds. No stridor. No wheezing, rhonchi or rales.  ?Chest:  ?   Chest wall: No tenderness.  ?Abdominal:  ?   General: Bowel sounds are normal.  ?   Palpations: Abdomen is soft.  ?Musculoskeletal:     ?   General: No swelling, tenderness, deformity or signs of injury. Normal range of motion.  ?   Right lower leg: No edema.  ?   Left lower leg: No edema.  ?Skin: ?   General: Skin is warm and dry.  ?   Capillary Refill: Capillary refill takes less than 2 seconds.  ?   Coloration: Skin is not jaundiced or pale.  ?   Findings: No bruising, erythema, lesion or rash.  ?Neurological:  ?   General: No focal deficit present.  ?   Mental Status: She is alert and oriented to person, place, and time. Mental status is at baseline.  ?   Cranial Nerves: No cranial nerve deficit.  ?   Sensory: No sensory deficit.  ?   Motor: No weakness.  ?   Coordination: Coordination normal.  ?Psychiatric:     ?   Mood and Affect: Mood normal.     ?   Behavior: Behavior normal.     ?   Thought Content: Thought content normal.     ?   Judgment: Judgment normal.  ? ? ?No results found for any visits on 07/16/21. ? Assessment & Plan  ?  ? ?Problem List Items Addressed This Visit   ? ?  ? Musculoskeletal and Integument  ? Atopic dermatitis  ?  Chronic, stable ?Waxes/wanes ?Facial involvement and scalp; previously seen at derm ?Request for refills  of current medications ? ?  ?  ? Relevant Medications  ? ketoconazole (NIZORAL) 2 % shampoo  ? hydrocortisone 2.5 % lotion  ?  ? Other  ? Prediabetes - Primary  ?  Chronic, previously stable  ?Last A1c 5.7% ?Continue 750 XR Metformin BID to assist ?Continue to recommend balanced, lower carb meals. Smaller meal size, adding snacks. Choosing water as drink of choice and increasing purposeful exercise. ?6 month f/u recommended  ? ?  ?  ? Relevant Medications  ?  metFORMIN (GLUCOPHAGE-XR) 750 MG 24 hr tablet  ? Other Relevant Orders  ? Hemoglobin A1c  ? ? ? ?Return in about 6 months (around 01/15/2022) for chonic disease management.  ?   ? ?I, Jacky Kindle, FNP, have reviewed all documentation for this visit. The documentation on 07/16/21 for the exam, diagnosis, procedures, and orders are all accurate and complete. ? ? ?Jacky Kindle, FNP  ? Family Practice ?[phone] (phone) ?[phone] (fax) ? ?[facility] ?

## 2021-07-14 ENCOUNTER — Other Ambulatory Visit: Payer: Self-pay | Admitting: Family Medicine

## 2021-07-14 DIAGNOSIS — R7303 Prediabetes: Secondary | ICD-10-CM

## 2021-07-16 ENCOUNTER — Encounter: Payer: Self-pay | Admitting: Family Medicine

## 2021-07-16 ENCOUNTER — Ambulatory Visit: Payer: BC Managed Care – PPO | Admitting: Family Medicine

## 2021-07-16 VITALS — BP 127/86 | HR 86 | Temp 97.7°F | Resp 16 | Wt 247.1 lb

## 2021-07-16 DIAGNOSIS — L209 Atopic dermatitis, unspecified: Secondary | ICD-10-CM | POA: Diagnosis not present

## 2021-07-16 DIAGNOSIS — R7303 Prediabetes: Secondary | ICD-10-CM | POA: Diagnosis not present

## 2021-07-16 MED ORDER — HYDROCORTISONE 2.5 % EX LOTN: 1.0000 "application " | TOPICAL_LOTION | Freq: Every day | CUTANEOUS | 3 refills | Status: DC | PRN

## 2021-07-16 MED ORDER — KETOCONAZOLE 2 % EX SHAM: 1.0000 "application " | MEDICATED_SHAMPOO | CUTANEOUS | 3 refills | Status: DC

## 2021-07-16 MED ORDER — METFORMIN HCL ER 750 MG PO TB24: 750.0000 mg | ORAL_TABLET | Freq: Two times a day (BID) | ORAL | 3 refills | Status: DC

## 2021-07-16 NOTE — Assessment & Plan Note (Signed)
Chronic, previously stable  ?Last A1c 5.7% ?Continue 750 XR Metformin BID to assist ?Continue to recommend balanced, lower carb meals. Smaller meal size, adding snacks. Choosing water as drink of choice and increasing purposeful exercise. ?6 month f/u recommended  ?

## 2021-07-16 NOTE — Assessment & Plan Note (Signed)
>>  ASSESSMENT AND PLAN FOR PREDIABETES WRITTEN ON 07/16/2021 10:20 AM BY PAYNE, ELISE T, FNP  Chronic, previously stable  Last A1c 5.7% Continue 750 XR Metformin BID to assist Continue to recommend balanced, lower carb meals. Smaller meal size, adding snacks. Choosing water as drink of choice and increasing purposeful exercise. 6 month f/u recommended

## 2021-07-16 NOTE — Assessment & Plan Note (Signed)
Chronic, stable ?Waxes/wanes ?Facial involvement and scalp; previously seen at derm ?Request for refills of current medications ?

## 2021-07-17 LAB — HEMOGLOBIN A1C
Est. average glucose Bld gHb Est-mCnc: 111 mg/dL
Hgb A1c MFr Bld: 5.5 % (ref 4.8–5.6)

## 2021-07-31 ENCOUNTER — Ambulatory Visit (INDEPENDENT_AMBULATORY_CARE_PROVIDER_SITE_OTHER): Payer: BC Managed Care – PPO

## 2021-07-31 ENCOUNTER — Other Ambulatory Visit (HOSPITAL_COMMUNITY)
Admission: RE | Admit: 2021-07-31 | Discharge: 2021-07-31 | Disposition: A | Discharge status: Home / Self Care | Payer: BC Managed Care – PPO | Source: Ambulatory Visit | Attending: Obstetrics and Gynecology | Admitting: Obstetrics and Gynecology

## 2021-07-31 VITALS — BP 125/81 | HR 87 | Wt 244.0 lb

## 2021-07-31 DIAGNOSIS — B9689 Other specified bacterial agents as the cause of diseases classified elsewhere: Secondary | ICD-10-CM

## 2021-07-31 DIAGNOSIS — B3731 Acute candidiasis of vulva and vagina: Secondary | ICD-10-CM

## 2021-07-31 DIAGNOSIS — N760 Acute vaginitis: Secondary | ICD-10-CM | POA: Insufficient documentation

## 2021-07-31 NOTE — Progress Notes (Signed)
SUBJECTIVE:  ?36 y.o. female complains of itching ,vaginal discharge since last Thursday ?Denies abnormal vaginal bleeding or significant pelvic pain or ?fever. No UTI symptoms. Denies history of known exposure to STD. ? ?No LMP recorded. ? ?OBJECTIVE:  ?She appears well, afebrile. ?Urine dipstick: not done. ? ?ASSESSMENT:  ?Vaginal Discharge  ?Vaginal Itching  ? ? ?PLAN:  ?GC, chlamydia, trichomonas, BVAG, CVAG probe sent to lab. ?Treatment: To be determined once lab results are received ?ROV prn if symptoms persist or worsen.  ?Pt follow up as needed.  ?

## 2021-08-01 LAB — CERVICOVAGINAL ANCILLARY ONLY
Bacterial Vaginitis (gardnerella): POSITIVE — AB
Candida Glabrata: NEGATIVE
Candida Vaginitis: POSITIVE — AB
Comment: NEGATIVE
Comment: NEGATIVE
Comment: NEGATIVE

## 2021-08-01 MED ORDER — FLUCONAZOLE 150 MG PO TABS: 150.0000 mg | ORAL_TABLET | Freq: Once | ORAL | 0 refills | Status: AC

## 2021-08-01 MED ORDER — METRONIDAZOLE 500 MG PO TABS: 500.0000 mg | ORAL_TABLET | Freq: Two times a day (BID) | ORAL | 0 refills | Status: AC

## 2021-08-01 NOTE — Addendum Note (Signed)
Addended by: Radene Gunning A on: 08/01/2021 02:16 PM ? ? Modules accepted: Orders ? ?

## 2021-08-10 ENCOUNTER — Telehealth: Payer: Self-pay

## 2021-08-10 NOTE — Telephone Encounter (Signed)
Return call to pt regarding her message to front desk that she wanted STD screening on her swab from 07/31/21. Pt was asked when worked up any STD's screening desired and declined at the time.  ?Pt advised she will have to retest/ make an appointment for STD screening.  ? ?

## 2021-10-16 ENCOUNTER — Telehealth: Payer: BC Managed Care – PPO | Admitting: Physician Assistant

## 2021-10-16 DIAGNOSIS — J208 Acute bronchitis due to other specified organisms: Secondary | ICD-10-CM

## 2021-10-16 DIAGNOSIS — B9689 Other specified bacterial agents as the cause of diseases classified elsewhere: Secondary | ICD-10-CM

## 2021-10-16 DIAGNOSIS — J4521 Mild intermittent asthma with (acute) exacerbation: Secondary | ICD-10-CM

## 2021-10-16 MED ORDER — PREDNISONE 10 MG PO TABS: 40.0000 mg | ORAL_TABLET | Freq: Every day | ORAL | 0 refills | Status: AC

## 2021-10-16 MED ORDER — AZITHROMYCIN 250 MG PO TABS: ORAL_TABLET | ORAL | 0 refills | Status: AC

## 2021-10-16 MED ORDER — ALBUTEROL SULFATE HFA 108 (90 BASE) MCG/ACT IN AERS: 2.0000 | INHALATION_SPRAY | Freq: Four times a day (QID) | RESPIRATORY_TRACT | 0 refills | Status: DC | PRN

## 2021-10-16 NOTE — Patient Instructions (Signed)
Yolanda Tyler, thank you for joining Piedad Climes, PA-C for today's virtual visit.  While this provider is not your primary care provider (PCP), if your PCP is located in our provider database this encounter information will be shared with them immediately following your visit.  Consent: (Patient) Yolanda Tyler provided verbal consent for this virtual visit at the beginning of the encounter.  Current Medications:  Current Outpatient Medications:    albuterol (VENTOLIN HFA) 108 (90 Base) MCG/ACT inhaler, INHALE 2 PUFFS BY MOUTH EVERY 6 HOURS AS NEEDED FOR WHEEZE OR SHORTNESS OF BREATH, Disp: 18 each, Rfl: 0   buPROPion (WELLBUTRIN XL) 150 MG 24 hr tablet, Take 150 mg by mouth daily., Disp: , Rfl:    carboxymethylcellulose (REFRESH PLUS) 0.5 % SOLN, 1 drop at bedtime as needed., Disp: , Rfl:    Cholecalciferol ([facility] VIT D 5000 HIGH-POTENCY PO), Take 5,000 Units by mouth at bedtime., Disp: , Rfl:    DULoxetine (CYMBALTA) 20 MG capsule, Take 40 mg by mouth 2 (two) times daily., Disp: , Rfl:    ferrous sulfate 325 (65 FE) MG EC tablet, Take 1 tablet (325 mg total) by mouth 2 (two) times daily., Disp: 180 tablet, Rfl: 1   hydrocortisone 2.5 % lotion, Apply 1 application. topically daily as needed (rash)., Disp: 118 mL, Rfl: 3   ketoconazole (NIZORAL) 2 % shampoo, Apply 1 application. topically once a week., Disp: 120 mL, Rfl: 3   lamoTRIgine (LAMICTAL) 200 MG tablet, Take 200 mg by mouth daily., Disp: , Rfl:    metFORMIN (GLUCOPHAGE-XR) 750 MG 24 hr tablet, Take 1 tablet (750 mg total) by mouth 2 (two) times daily with a meal., Disp: 180 tablet, Rfl: 3   methylphenidate (RITALIN LA) 30 MG 24 hr capsule, Take 30 mg by mouth daily., Disp: , Rfl:    Multiple Vitamin (MULTIVITAMIN WITH MINERALS) TABS tablet, Take 1 tablet by mouth at bedtime., Disp: , Rfl:    polyethylene glycol (MIRALAX / GLYCOLAX) 17 g packet, Take 17 g by mouth daily as needed for mild constipation., Disp: 14 each, Rfl: 0    senna-docusate (SENOKOT-S) 8.6-50 MG tablet, Take 1 tablet by mouth 2 (two) times daily between meals as needed for moderate constipation., Disp: , Rfl: 0   traZODone (DESYREL) 50 MG tablet, Take 50-100 mg by mouth See admin instructions. Takes at bedtime. Dose depends on how she's feeling., Disp: , Rfl:    Medications ordered in this encounter:  No orders of the defined types were placed in this encounter.    *If you need refills on other medications prior to your next appointment, please contact your pharmacy*  Follow-Up: Call back or seek an in-person evaluation if the symptoms worsen or if the condition fails to improve as anticipated.  Other Instructions Please keep well-hydrated and get plenty of rest. Ok to continue Tylenol and Ibuprofen for throat pain. Salt water gargles can also be beneficial. Take the antibiotic as directed and the 3-day burst of prednisone. I have sent in a refill of your albuterol to use as directed, when needed. Start OTC Mucinex-DM to think congestion and calm the cough further.  I recommend OTC Vitamin D3 1000 units daily and Vitamin C 1000 mg daily for immune support.   Follow-up in person if symptoms are not resolving, or if you note any new or worsening symptoms despite treatment.   If you have been instructed to have an in-person evaluation today at a local Urgent Care facility, please use  the link below. It will take you to a list of all of our available [facility], including address, phone number and hours of operation. Please do not delay care.  [facility]  If you or a family member do not have a primary care provider, use the link below to schedule a visit and establish care. When you choose a [facility] primary care physician or advanced practice provider, you gain a long-term partner in health. Find a Primary Care Provider  Learn more about [facility]'s in-office and virtual care options: [facility] - Get Care  Now

## 2021-10-16 NOTE — Progress Notes (Signed)
Virtual Visit Consent   Yolanda Tyler, you are scheduled for a virtual visit with a [facility] provider today. Just as with appointments in the office, your consent must be obtained to participate. Your consent will be active for this visit and any virtual visit you may have with one of our providers in the next 365 days. If you have a MyChart account, a copy of this consent can be sent to you electronically.  As this is a virtual visit, video technology does not allow for your provider to perform a traditional examination. This may limit your provider's ability to fully assess your condition. If your provider identifies any concerns that need to be evaluated in person or the need to arrange testing (such as labs, EKG, etc.), we will make arrangements to do so. Although advances in technology are sophisticated, we cannot ensure that it will always work on either your end or our end. If the connection with a video visit is poor, the visit may have to be switched to a telephone visit. With either a video or telephone visit, we are not always able to ensure that we have a secure connection.  By engaging in this virtual visit, you consent to the provision of healthcare and authorize for your insurance to be billed (if applicable) for the services provided during this visit. Depending on your insurance coverage, you may receive a charge related to this service.  I need to obtain your verbal consent now. Are you willing to proceed with your visit today? Yolanda Tyler has provided verbal consent on 10/16/2021 for a virtual visit (video or telephone). Piedad Climes, [place]  Date: 10/16/2021 8:59 AM  Virtual Visit via Video Note   I, Piedad Climes, PA-C, attempted to connect with Yolanda Tyler; MRN [id number] on 10/16/21 via [employer] to complete a video urgent care visit. The patient was unable to successfully connect to the video platform. As such, the patient was contacted by this provider  via phone to complete the encounter.   Location: Patient: Virtual Visit Location Patient: Home Provider: Virtual Visit Location Provider: Home Office   I discussed the limitations of evaluation and management by telemedicine and the availability of in person appointments. The patient expressed understanding and agreed to proceed.    History of Present Illness: Yolanda Tyler is a 36 y.o. who identifies as a female who was assigned female at birth, and is being seen today for sore throat for the past week. Initially with mild sinus pressure and ear pressure. Throat has continued to progress, worse in the morning and bilateral. Has been taking OTC pain relievers which help but do not resolve the sore throat. This has not worsened but is still persistent. Notes now over the past 3 days having substantial worsening of her URI symptoms now with substantial congestion and cough with wheezing. Notes at night time harder for her to lay down because of her wheezing.   HPI: HPI  Problems:  Patient Active Problem List   Diagnosis Date Noted   Atopic dermatitis 07/16/2021   Leukocytosis 05/31/2021   Elevated liver enzymes 05/30/2021   Iron deficiency anemia 05/30/2021   Cholelithiasis 05/29/2021   Acute biliary pancreatitis 05/28/2021   Chronic asthma    Annual physical exam 04/04/2021   Bruises easily 04/04/2021   Anxiety and depression 09/18/2020   Chronic idiopathic constipation 09/18/2020   Avitaminosis D 09/18/2020   Prediabetes 02/03/2020   Hyperlipidemia 02/03/2020   Elevated BP without diagnosis  of hypertension 02/03/2020   Morbid obesity (HCC) 12/29/2019   Birth control counseling 05/06/2018    Allergies:  Allergies  Allergen Reactions   Apple Itching and Swelling    Tolerates if cooked   Pear Itching and Swelling    Tolerates if cooked   Penicillins Hives   Clindamycin/Lincomycin Rash   Medications:  Current Outpatient Medications:    albuterol (VENTOLIN HFA) 108 (90 Base)  MCG/ACT inhaler, Inhale 2 puffs into the lungs every 6 (six) hours as needed for wheezing or shortness of breath., Disp: 8 g, Rfl: 0   azithromycin (ZITHROMAX) 250 MG tablet, Take 2 tablets on day 1, then 1 tablet daily on days 2 through 5, Disp: 6 tablet, Rfl: 0   predniSONE (DELTASONE) 10 MG tablet, Take 4 tablets (40 mg total) by mouth daily with breakfast for 3 days., Disp: 12 tablet, Rfl: 0   buPROPion (WELLBUTRIN XL) 150 MG 24 hr tablet, Take 150 mg by mouth daily., Disp: , Rfl:    carboxymethylcellulose (REFRESH PLUS) 0.5 % SOLN, 1 drop at bedtime as needed., Disp: , Rfl:    Cholecalciferol ([employer] VIT D 5000 HIGH-POTENCY PO), Take 5,000 Units by mouth at bedtime., Disp: , Rfl:    DULoxetine (CYMBALTA) 20 MG capsule, Take 40 mg by mouth 2 (two) times daily., Disp: , Rfl:    ferrous sulfate 325 (65 FE) MG EC tablet, Take 1 tablet (325 mg total) by mouth 2 (two) times daily., Disp: 180 tablet, Rfl: 1   hydrocortisone 2.5 % lotion, Apply 1 application. topically daily as needed (rash)., Disp: 118 mL, Rfl: 3   ketoconazole (NIZORAL) 2 % shampoo, Apply 1 application. topically once a week., Disp: 120 mL, Rfl: 3   lamoTRIgine (LAMICTAL) 200 MG tablet, Take 200 mg by mouth daily., Disp: , Rfl:    metFORMIN (GLUCOPHAGE-XR) 750 MG 24 hr tablet, Take 1 tablet (750 mg total) by mouth 2 (two) times daily with a meal., Disp: 180 tablet, Rfl: 3   methylphenidate (RITALIN LA) 30 MG 24 hr capsule, Take 30 mg by mouth daily., Disp: , Rfl:    Multiple Vitamin (MULTIVITAMIN WITH MINERALS) TABS tablet, Take 1 tablet by mouth at bedtime., Disp: , Rfl:    polyethylene glycol (MIRALAX / GLYCOLAX) 17 g packet, Take 17 g by mouth daily as needed for mild constipation., Disp: 14 each, Rfl: 0   senna-docusate (SENOKOT-S) 8.6-50 MG tablet, Take 1 tablet by mouth 2 (two) times daily between meals as needed for moderate constipation., Disp: , Rfl: 0   traZODone (DESYREL) 50 MG tablet, Take 50-100 mg by mouth See admin  instructions. Takes at bedtime. Dose depends on how she's feeling., Disp: , Rfl:   Observations/Objective: No labored breathing. Speech is clear and coherent with logical content.  Patient is alert and oriented at baseline.   Assessment and Plan: 1. Acute bacterial bronchitis - azithromycin (ZITHROMAX) 250 MG tablet; Take 2 tablets on day 1, then 1 tablet daily on days 2 through 5  Dispense: 6 tablet; Refill: 0 - albuterol (VENTOLIN HFA) 108 (90 Base) MCG/ACT inhaler; Inhale 2 puffs into the lungs every 6 (six) hours as needed for wheezing or shortness of breath.  Dispense: 8 g; Refill: 0 - predniSONE (DELTASONE) 10 MG tablet; Take 4 tablets (40 mg total) by mouth daily with breakfast for 3 days.  Dispense: 12 tablet; Refill: 0  2. Mild intermittent asthma with exacerbation - albuterol (VENTOLIN HFA) 108 (90 Base) MCG/ACT inhaler; Inhale 2 puffs into the lungs  every 6 (six) hours as needed for wheezing or shortness of breath.  Dispense: 8 g; Refill: 0  Concern of developing bacterial bronchitis and asthma exacerbation.. Rx azithromycin and 3-day burst of prednisone.  Increase fluids.  Rest.  Saline nasal spray.  Probiotic.  Mucinex as directed.  Humidifier in bedroom. Gave refill of her albuterol inhaler.  Call or return to clinic if symptoms are not improving.   Follow Up Instructions: I discussed the assessment and treatment plan with the patient. The patient was provided an opportunity to ask questions and all were answered. The patient agreed with the plan and demonstrated an understanding of the instructions.  A copy of instructions were sent to the patient via MyChart unless otherwise noted below.   The patient was advised to call back or seek an in-person evaluation if the symptoms worsen or if the condition fails to improve as anticipated.  Time:  I spent 10 minutes with the patient via telehealth technology discussing the above problems/concerns.    Piedad Climes, PA-C

## 2021-12-06 IMAGING — CR DG CHEST 2V
1 series · 2 of 2 positions shown · non-contrast
Comparison: None

CLINICAL DATA: History of latent tuberculosis

EXAM:
CHEST - 2 VIEW

[Series 1: dg chest 2 view · 0.14mm/px · 2 of 2 slices shown]
[im 1/2]
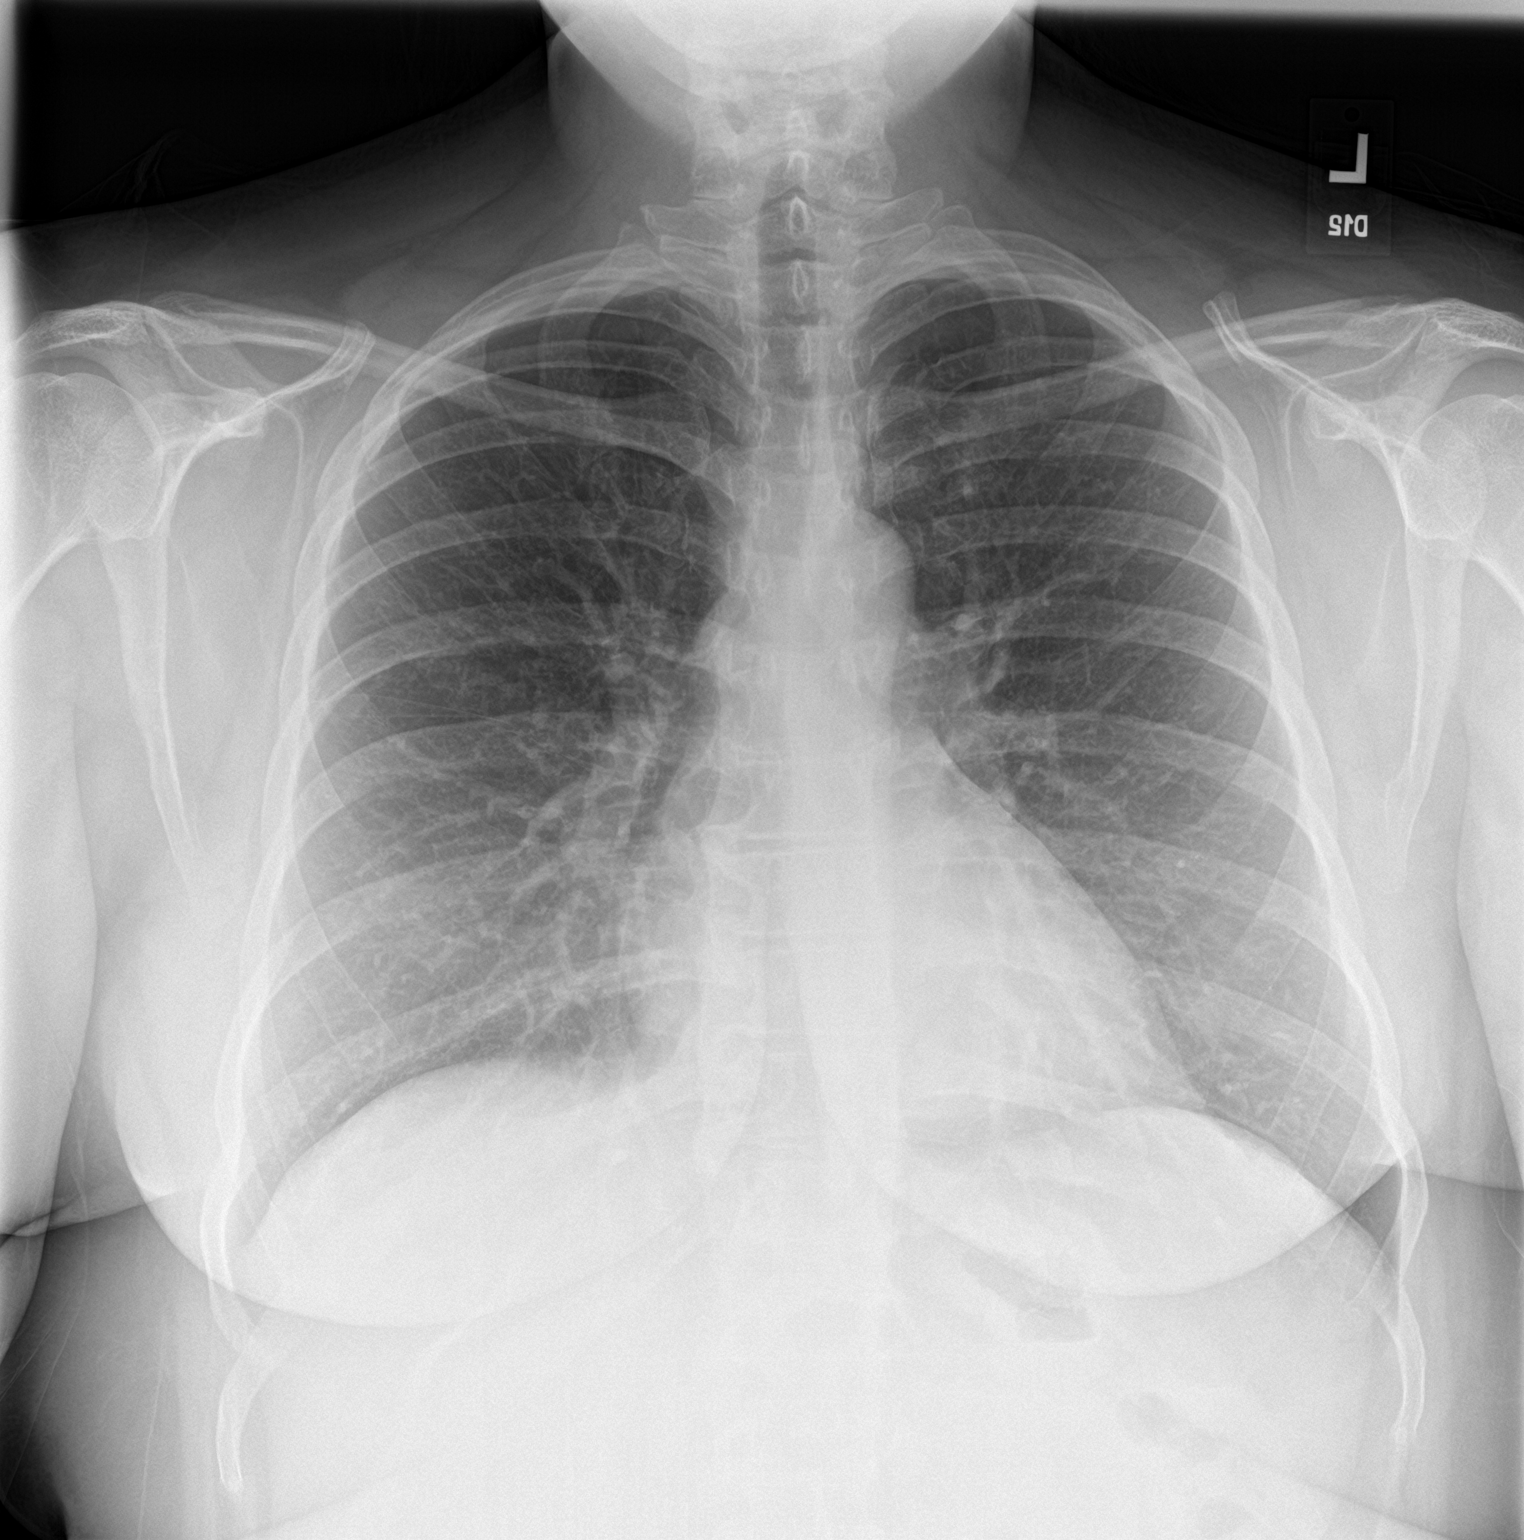
[im 2/2]
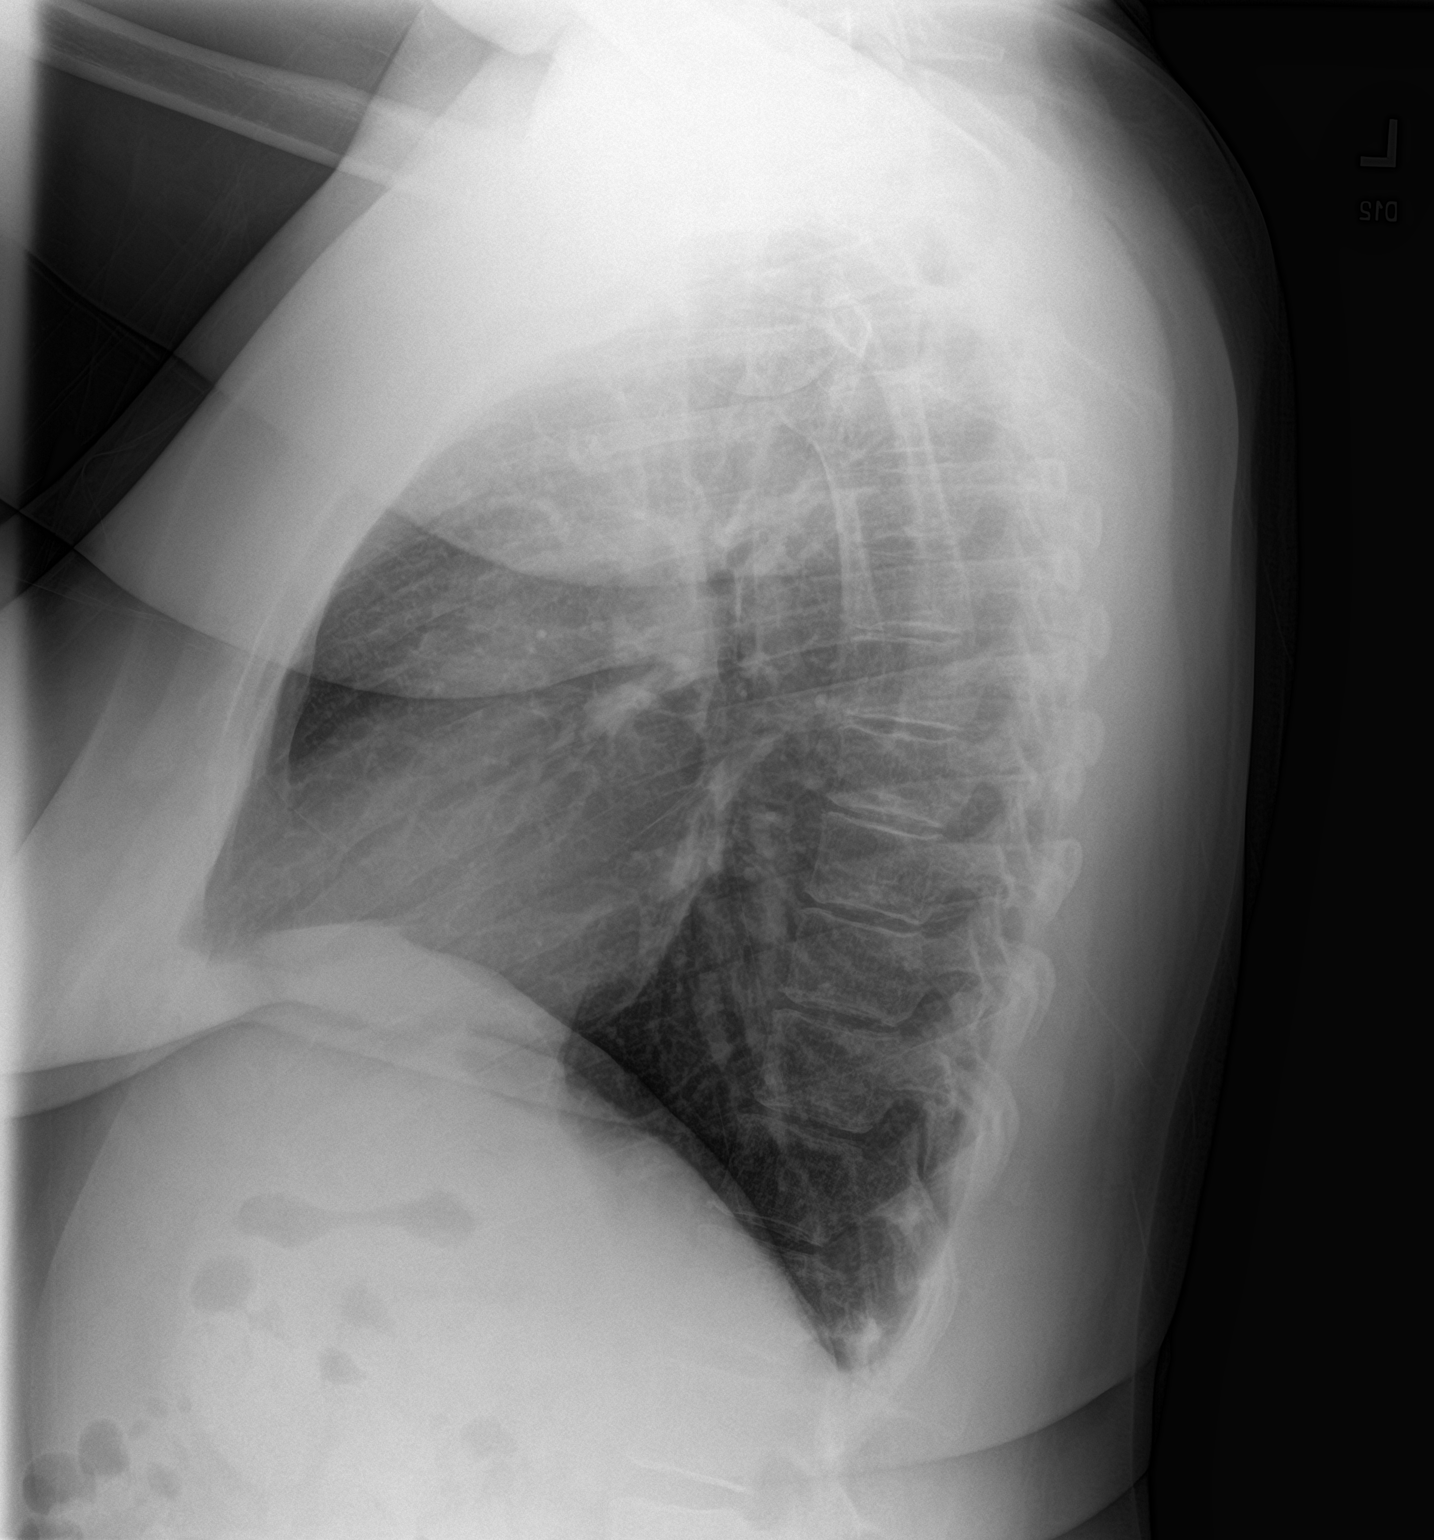

[2 of 2 positions shown; findings below may reference images not displayed]

FINDINGS: No scarring, architectural distortion or other features of prior
granulomatous disease. No consolidation, features of edema,
pneumothorax, or effusion. Pulmonary vascularity is normally
distributed. The cardiomediastinal contours are unremarkable. No
acute osseous or soft tissue abnormality.
IMPRESSION: No acute cardiopulmonary abnormality. No radiographic sequela of
prior granulomatous disease.

## 2022-04-04 ENCOUNTER — Telehealth: Payer: BC Managed Care – PPO | Admitting: Physician Assistant

## 2022-04-04 DIAGNOSIS — J208 Acute bronchitis due to other specified organisms: Secondary | ICD-10-CM | POA: Diagnosis not present

## 2022-04-04 DIAGNOSIS — B9689 Other specified bacterial agents as the cause of diseases classified elsewhere: Secondary | ICD-10-CM | POA: Diagnosis not present

## 2022-04-04 MED ORDER — PREDNISONE 20 MG PO TABS: 40.0000 mg | ORAL_TABLET | Freq: Every day | ORAL | 0 refills | Status: DC

## 2022-04-04 MED ORDER — DOXYCYCLINE HYCLATE 100 MG PO TABS: 100.0000 mg | ORAL_TABLET | Freq: Two times a day (BID) | ORAL | 0 refills | Status: DC

## 2022-04-04 NOTE — Progress Notes (Signed)
We are sorry that you are not feeling well.  Here is how we plan to help!  Based on your presentation I believe you most likely have A cough due to bacteria.  When patients have a fever and a productive cough with a change in color or increased sputum production, we are concerned about bacterial bronchitis.  If left untreated it can progress to pneumonia.  If your symptoms do not improve with your treatment plan it is important that you contact your provider.   I have prescribed Doxycycline 100 mg twice a day for 7 days     In addition you may use A non-prescription cough medication called Robitussin DAC. Take 2 teaspoons every 8 hours or Delsym: take 2 teaspoons every 12 hours. OR A non-prescription cough medication called Mucinex DM: take 2 tablets every 12 hours.  Prednisone 20mg  Take 2 tablets (40mg ) daily for 5 days  From your responses in the eVisit questionnaire you describe inflammation in the upper respiratory tract which is causing a significant cough.  This is commonly called Bronchitis and has four common causes:   Allergies Viral Infections Acid Reflux Bacterial Infection Allergies, viruses and acid reflux are treated by controlling symptoms or eliminating the cause. An example might be a cough caused by taking certain blood pressure medications. You stop the cough by changing the medication. Another example might be a cough caused by acid reflux. Controlling the reflux helps control the cough.  USE OF BRONCHODILATOR ("RESCUE") INHALERS: There is a risk from using your bronchodilator too frequently.  The risk is that over-reliance on a medication which only relaxes the muscles surrounding the breathing tubes can reduce the effectiveness of medications prescribed to reduce swelling and congestion of the tubes themselves.  Although you feel brief relief from the bronchodilator inhaler, your asthma may actually be worsening with the tubes becoming more swollen and filled with mucus.  This  can delay other crucial treatments, such as oral steroid medications. If you need to use a bronchodilator inhaler daily, several times per day, you should discuss this with your provider.  There are probably better treatments that could be used to keep your asthma under control.     HOME CARE Only take medications as instructed by your medical team. Complete the entire course of an antibiotic. Drink plenty of fluids and get plenty of rest. Avoid close contacts especially the very young and the elderly Cover your mouth if you cough or cough into your sleeve. Always remember to wash your hands A steam or ultrasonic humidifier can help congestion.   GET HELP RIGHT AWAY IF: You develop worsening fever. You become short of breath You cough up blood. Your symptoms persist after you have completed your treatment plan MAKE SURE YOU  Understand these instructions. Will watch your condition. Will get help right away if you are not doing well or get worse.    Thank you for choosing an e-visit.  Your e-visit answers were reviewed by a board certified advanced clinical practitioner to complete your personal care plan. Depending upon the condition, your plan could have included both over the counter or prescription medications.  Please review your pharmacy choice. Make sure the pharmacy is open so you can pick up prescription now. If there is a problem, you may contact your provider through [employer] and have the prescription routed to another pharmacy.  Your safety is important to [place]. If you have drug allergies check your prescription carefully.   For the next  24 hours you can use MyChart to ask questions about today's visit, request a non-urgent call back, or ask for a work or school excuse. You will get an email in the next two days asking about your experience. I hope that your e-visit has been valuable and will speed your recovery.  I have spent 5 minutes in review of e-visit  questionnaire, review and updating patient chart, medical decision making and response to patient.   Mar Daring, PA-C

## 2022-04-18 ENCOUNTER — Encounter: Payer: Self-pay | Admitting: Nurse Practitioner

## 2022-04-18 ENCOUNTER — Ambulatory Visit: Payer: BC Managed Care – PPO | Admitting: Nurse Practitioner

## 2022-04-18 VITALS — BP 122/84 | HR 91 | Temp 98.6°F | Ht 66.0 in | Wt 256.6 lb

## 2022-04-18 DIAGNOSIS — Z0000 Encounter for general adult medical examination without abnormal findings: Secondary | ICD-10-CM

## 2022-04-18 DIAGNOSIS — D649 Anemia, unspecified: Secondary | ICD-10-CM

## 2022-04-18 DIAGNOSIS — R197 Diarrhea, unspecified: Secondary | ICD-10-CM

## 2022-04-18 DIAGNOSIS — E559 Vitamin D deficiency, unspecified: Secondary | ICD-10-CM

## 2022-04-18 DIAGNOSIS — R7303 Prediabetes: Secondary | ICD-10-CM | POA: Diagnosis not present

## 2022-04-18 DIAGNOSIS — F331 Major depressive disorder, recurrent, moderate: Secondary | ICD-10-CM

## 2022-04-18 DIAGNOSIS — D72829 Elevated white blood cell count, unspecified: Secondary | ICD-10-CM

## 2022-04-18 DIAGNOSIS — R748 Abnormal levels of other serum enzymes: Secondary | ICD-10-CM

## 2022-04-18 DIAGNOSIS — Z23 Encounter for immunization: Secondary | ICD-10-CM | POA: Diagnosis not present

## 2022-04-18 DIAGNOSIS — E785 Hyperlipidemia, unspecified: Secondary | ICD-10-CM

## 2022-04-18 DIAGNOSIS — K219 Gastro-esophageal reflux disease without esophagitis: Secondary | ICD-10-CM

## 2022-04-18 NOTE — Patient Instructions (Addendum)
It was a pleasure seeing you today! Thank you for trusting me with your care.   I will be in touch with you about your labs.   I want you to try taking pepcid 20mg  twice a day to see if this helps the nausea sensation you are experiencing.      If labs were collected today, you will see the results as soon as they become available in [facility]. I will review these labs once I have received all of the results and send you comments and recommendations, if any. If you have specific concerns, we can set up an appointment (virtual or in person) to go over details and come up with a plan together.   If you have received any referrals today, the office where the referral was made will be in contact with you to set up your appointment.   If you have received orders for imaging today, the imaging office will contact you to schedule this. Please note that some imaging requires a prior authorization from insurance, so an order is not a guarantee this will be covered by your insurance, but I want to assure you we will do our best to get this covered and if it is not, you will be notified and we can come up with an alternative plan.   If you take regular prescription medications, please contact your pharmacy for routine refill requests. They will send this directly to [place].  If you were ordered new medication as a part of your examination and treatment today, please contact your pharmacy to determine the status. Many prescriptions require a prior authorization and the pharmacy will contact us to get this started. This may take a few days for approval or denial. If the medication is denied, we will work with you to try alternative medications.   If you have any questions or concerns, please do not hesitate to contact the office via telephone or [place].  MyChart messages are received by the [facility] staff during regular business hours Monday through Friday and we do our best to respond in a timely manner.  If your  request requires an appointment, the staff will gladly help set that up so that we have the time dedicated to ensure that your questions are appropriately answered.

## 2022-04-18 NOTE — Progress Notes (Signed)
[name], DNP, AGNP-c [facility] [address] [place], [place] [address] Main Office [phone]  BP 122/84   Pulse 91   Temp 98.6 F (37 C)   Ht 5\' 6"  (1.676 m)   Wt 256 lb 9.6 oz (116.4 kg)   LMP 03/29/2022   BMI 41.42 kg/m    Subjective:    Patient ID: Yolanda Tyler, female    DOB: 11/24/1985, 37 y.o.   MRN: [id number]  HPI: Yolanda Tyler is a 37 y.o. female presenting on 04/18/2022 for comprehensive medical examination.   She tells me that she is having intermittent nausea everyday, but feeling it just in her throat. She also has diarrhea daily without cramping, but urgency present and incomplete emptying.  Historically she has had steady constipation her entire life.  She is not having any blood in her stool, no rapid weight loss, fevers, night sweats.  She did have her gallbladder removed in Feb 2023, but did not have any GI symptoms afterwards.  She has not had any dietary changes, new foods, new vitamins.  Feels hot all the time- depression- doesn't stay asleep- night sweats-  Her chest is breaking out with tiny papules with clindamycin and doxycycline   Takes trazodone for sleep. Prescribed gabapentin but doesn't take it.   Thyroid does feel enlarged She was referred by 03-09-1993 IMMUNIZATIONS:   {Flu:28474:o:"Flu vaccine completed elsewhere this season"} {Prevnar 13:28477:o:"Prevnar 13 N/A for this patient"} {Prevnar 20:28476:o:"Prevnar 20 N/A for this patient"} {Pneumovax 23:28475:o:"Pneumovax 23 N/A for this patient"} {Vac Shingrix:28478:o:"Shingrix N/A for this patient"} {HPV:28479:o:"HPV N/A for this patient"} {Tetanus:28480:o:"Tetanus completed in the last 10 years"}  HEALTH MAINTENANCE: Pap Smear {HM Status:28583:o} Mammogram {HM Status:28583:o} Colon Cancer Screening {HM Status:28583:o} Bone Density {HM Status:28583:o} STI Testing {HM Status:28583:o}  She reports regular vision exams q1-5y: {YES/NO:21197::"Yes  "} She reports regular dental exams q 59m:  {YES/NO:21197::"Yes "} {Daily diet habits:20576::"The patient eats a regular, healthy diet."} She endorses exercise and/or activity of: {Blank single:19197::"***"}  She currently: {SESOCIALHISTORY:28582}  Current medical concerns include:{Blank single:19197::"none","***"}   {Ros; complete female:19594::"A comprehensive review of systems was negative."}  Most Recent Depression Screen:     04/18/2022    9:39 AM 07/16/2021    9:38 AM 04/04/2021    9:36 AM 09/18/2020    8:27 AM 03/21/2020    9:04 AM  Depression screen PHQ 2/9  Decreased Interest 3 0 3 3 0  Down, Depressed, Hopeless 3 1 3 3  0  PHQ - 2 Score 6 1 6 6  0  Altered sleeping 3 3 1 2    Tired, decreased energy 3 2 3 3    Change in appetite 3 0 3 2   Feeling bad or failure about yourself  3 2 3 3    Trouble concentrating 3 1 2 3    Moving slowly or fidgety/restless 0 0 0 0   Suicidal thoughts 0 0 0 0   PHQ-9 Score 21 9 18 19    Difficult doing work/chores Extremely dIfficult Somewhat difficult Extremely dIfficult Very difficult    Most Recent Anxiety Screen:      No data to display         Most Recent Fall Screen:    04/18/2022    9:39 AM 04/04/2021    9:37 AM 09/18/2020    8:27 AM 03/21/2020    9:05 AM 03/08/2020    8:53 AM  Fall Risk   Falls in the past year? 0 1 0 0 0  Number falls in past  yr: 0 0 0 0 0  Injury with Fall? 0 1 0 0 0  Risk for fall due to : No Fall Risks  No Fall Risks  No Fall Risks  Follow up Falls evaluation completed  Falls evaluation completed  Falls evaluation completed    Past medical history, surgical history, medications, allergies, family history and social history reviewed with patient today and changes made to appropriate areas of the chart.  Past Medical History:  Past Medical History:  Diagnosis Date  . Allergy   . Anxiety   . Asthma   . Depression   . History of colposcopy   . Mental disorder    depression and anxiety  . Vaginal Pap  smear, abnormal    Medications:  Current Outpatient Medications on File Prior to Visit  Medication Sig  . carboxymethylcellulose (REFRESH PLUS) 0.5 % SOLN 1 drop at bedtime as needed.  . Cholecalciferol ([employer] VIT D 5000 HIGH-POTENCY PO) Take 5,000 Units by mouth at bedtime.  . citalopram (CELEXA) 40 MG tablet Take 40 mg by mouth daily.  . hydrocortisone 2.5 % lotion Apply 1 application. topically daily as needed (rash).  Marland Kitchen ketoconazole (NIZORAL) 2 % shampoo Apply 1 application. topically once a week.  . lamoTRIgine (LAMICTAL) 200 MG tablet Take 200 mg by mouth daily.  . metFORMIN (GLUCOPHAGE-XR) 750 MG 24 hr tablet Take 1 tablet (750 mg total) by mouth 2 (two) times daily with a meal.  . Multiple Vitamin (MULTIVITAMIN WITH MINERALS) TABS tablet Take 1 tablet by mouth at bedtime.  . traZODone (DESYREL) 50 MG tablet Take 50-100 mg by mouth See admin instructions. Takes at bedtime. Dose depends on how she's feeling.  Marland Kitchen albuterol (VENTOLIN HFA) 108 (90 Base) MCG/ACT inhaler Inhale 2 puffs into the lungs every 6 (six) hours as needed for wheezing or shortness of breath. (Patient not taking: Reported on 04/18/2022)  . buPROPion (WELLBUTRIN XL) 150 MG 24 hr tablet Take 150 mg by mouth daily.  Marland Kitchen doxycycline (VIBRA-TABS) 100 MG tablet Take 1 tablet (100 mg total) by mouth 2 (two) times daily.  . ferrous sulfate 325 (65 FE) MG EC tablet Take 1 tablet (325 mg total) by mouth 2 (two) times daily.  . methylphenidate (RITALIN LA) 30 MG 24 hr capsule Take 30 mg by mouth daily.  . polyethylene glycol (MIRALAX / GLYCOLAX) 17 g packet Take 17 g by mouth daily as needed for mild constipation.  . predniSONE (DELTASONE) 20 MG tablet Take 2 tablets (40 mg total) by mouth daily with breakfast.  . senna-docusate (SENOKOT-S) 8.6-50 MG tablet Take 1 tablet by mouth 2 (two) times daily between meals as needed for moderate constipation.   No current facility-administered medications on file prior to visit.   Surgical  History:  Past Surgical History:  Procedure Laterality Date  . BUNIONECTOMY    . CHOLECYSTECTOMY N/A 05/30/2021   Procedure: LAPAROSCOPIC CHOLECYSTECTOMY WITH INTRAOPERATIVE CHOLANGIOGRAM;  Surgeon: Griselda Miner, MD;  Location: [facility];  Service: General;  Laterality: N/A;  . WISDOM TOOTH EXTRACTION     Allergies:  Allergies  Allergen Reactions  . Apple Itching and Swelling    Tolerates if cooked  . Pear Itching and Swelling    Tolerates if cooked  . Penicillins Hives  . Clindamycin/Lincomycin Rash   Family History:  Family History  Problem Relation Age of Onset  . Graves' disease Mother   . Depression Father   . Bone cancer Maternal Grandfather   . Heart disease Paternal Grandmother  Objective:    BP 122/84   Pulse 91   Temp 98.6 F (37 C)   Ht 5\' 6"  (1.676 m)   Wt 256 lb 9.6 oz (116.4 kg)   LMP 03/29/2022   BMI 41.42 kg/m   Wt Readings from Last 3 Encounters:  04/18/22 256 lb 9.6 oz (116.4 kg)  07/31/21 244 lb (110.7 kg)  07/16/21 247 lb 1.6 oz (112.1 kg)    Physical Exam  Results for orders placed or performed in visit on 07/31/21  Cervicovaginal ancillary only( [place])  Result Value Ref Range   Bacterial Vaginitis (gardnerella) Positive (A)    Candida Vaginitis Positive (A)    Candida Glabrata Negative    Comment      Normal Reference Range Bacterial Vaginosis - Negative   Comment Normal Reference Range Candida Species - Negative    Comment Normal Reference Range Candida Galbrata - Negative          Assessment & Plan:   Problem List Items Addressed This Visit   None      Follow up plan: No follow-ups on file.  NEXT PREVENTATIVE PHYSICAL DUE IN 1 YEAR.  PATIENT COUNSELING PROVIDED FOR ALL ADULT PATIENTS:  Consume a well balanced diet low in saturated fats, cholesterol, and moderation in carbohydrates.   This can be as simple as monitoring portion sizes and cutting back on sugary beverages such as soda and juice to start with.     Daily water consumption of at least 64 ounces.  Physical activity at least 180 minutes per week, if just starting out.   This can be as simple as taking the stairs instead of the elevator and walking 2-3 laps around the office  purposefully every day.   STD protection, partner selection, and regular testing if high risk.  Limited consumption of alcoholic beverages if alcohol is consumed.  For women, I recommend no more than 7 alcoholic beverages per week, spread out throughout the week.  Avoid "binge" drinking or consuming large quantities of alcohol in one setting.   Please let me know if you feel you may need help with reduction or quitting alcohol consumption.   Avoidance of nicotine, if used.  Please let me know if you feel you may need help with reduction or quitting nicotine use.   Daily mental health attention.  This can be in the form of 5 minute daily meditation, prayer, journaling, yoga, reflection, etc.   Purposeful attention to your emotions and mental state can significantly improve your overall wellbeing and Health.  Please know that I am here to help you with all of your health care goals and am happy to work with you to find a solution that works best for you.  The greatest advice I have received with any changes in life are to take it one step at a time, that even means if all you can focus on is the next 60 seconds, then do that and celebrate your victories.  With any changes in life, you will have set backs, and that is OK. The important thing to remember is, if you have a set back, it is not a failure, it is an opportunity to try again!  Health Maintenance Recommendations Screening Testing Mammogram Every 1 -2 years based on history and risk factors Starting at age 80 Pap Smear Ages 21-39 every 3 years Ages 71-65 every 5 years with HPV testing More frequent testing may be required based on results and history Colon Cancer Screening  Every 1-10 years based on  test performed, risk factors, and history Starting at age 85 Bone Density Screening Every 2-10 years based on history Starting at age 16 for women Recommendations for men differ based on medication usage, history, and risk factors AAA Screening One time ultrasound Men 44-23 years old who have every smoked Lung Cancer Screening Low Dose Lung CT every 12 months Age 45-80 years with a 30 pack-year smoking history who still smoke or who have quit within the last 15 years  Screening Labs Routine  Labs: Complete Blood Count (CBC), Complete Metabolic Panel (CMP), Cholesterol (Lipid Panel) Every 6-12 months based on history and medications May be recommended more frequently based on current conditions or previous results Hemoglobin A1c Lab Every 3-12 months based on history and previous results Starting at age 49 or earlier with diagnosis of diabetes, high cholesterol, BMI >26, and/or risk factors Frequent monitoring for patients with diabetes to ensure blood sugar control Thyroid Panel (TSH w/ T3 & T4) Every 6 months based on history, symptoms, and risk factors May be repeated more often if on medication HIV One time testing for all patients 30 and older May be repeated more frequently for patients with increased risk factors or exposure Hepatitis C One time testing for all patients 33 and older May be repeated more frequently for patients with increased risk factors or exposure Gonorrhea, Chlamydia Every 12 months for all sexually active persons 13-24 years Additional monitoring may be recommended for those who are considered high risk or who have symptoms PSA Men 25-25 years old with risk factors Additional screening may be recommended from age 137-69 based on risk factors, symptoms, and history  Vaccine Recommendations Tetanus Booster All adults every 10 years Flu Vaccine All patients 6 months and older every year COVID Vaccine All patients 12 years and older Initial dosing  with booster May recommend additional booster based on age and health history HPV Vaccine 2 doses all patients age 13-26 Dosing may be considered for patients over 26 Shingles Vaccine (Shingrix) 2 doses all adults 60 years and older Pneumonia (Pneumovax 23) All adults 37 years and older May recommend earlier dosing based on health history Pneumonia (Prevnar 66) All adults 68 years and older Dosed 1 year after Pneumovax 23  Additional Screening, Testing, and Vaccinations may be recommended on an individualized basis based on family history, health history, risk factors, and/or exposure.

## 2022-04-19 LAB — IRON,TIBC AND FERRITIN PANEL
Ferritin: 183 ng/mL — ABNORMAL HIGH (ref 15–150)
Iron Saturation: 25 % (ref 15–55)
Iron: 85 ug/dL (ref 27–159)
Total Iron Binding Capacity: 337 ug/dL (ref 250–450)
UIBC: 252 ug/dL (ref 131–425)

## 2022-04-20 LAB — ALLERGENS(10) FOODS
Allergen Corn, IgE: <0.1 kU/L
Egg, Whole IgE: <0.1 kU/L
F045-IgE Yeast: <0.1 kU/L
F081-IgE Cheese, Cheddar Type: <0.1 kU/L
F082-IgE Cheese, Mold Type: <0.1 kU/L
Malt: <0.1 kU/L
Milk IgE: <0.1 kU/L
Peanut IgE: <0.1 kU/L
Soybean IgE: <0.1 kU/L
Wheat IgE: <0.1 kU/L

## 2022-04-20 LAB — CBC WITH DIFFERENTIAL/PLATELET
Basophils Absolute: 0.1 10*3/uL (ref 0.0–0.2)
Basos: 1 %
EOS (ABSOLUTE): 0.1 10*3/uL (ref 0.0–0.4)
Eos: 2 %
Hematocrit: 38.5 % (ref 34.0–46.6)
Hemoglobin: 13.1 g/dL (ref 11.1–15.9)
Immature Grans (Abs): 0 10*3/uL (ref 0.0–0.1)
Immature Granulocytes: 1 %
Lymphocytes Absolute: 2.2 10*3/uL (ref 0.7–3.1)
Lymphs: 33 %
MCH: 29.1 pg (ref 26.6–33.0)
MCHC: 34 g/dL (ref 31.5–35.7)
MCV: 86 fL (ref 79–97)
Monocytes Absolute: 0.5 10*3/uL (ref 0.1–0.9)
Monocytes: 8 %
Neutrophils Absolute: 3.7 10*3/uL (ref 1.4–7.0)
Neutrophils: 55 %
Platelets: 332 10*3/uL (ref 150–450)
RBC: 4.5 x10E6/uL (ref 3.77–5.28)
RDW: 12 % (ref 11.7–15.4)
WBC: 6.6 10*3/uL (ref 3.4–10.8)

## 2022-04-20 LAB — COMPREHENSIVE METABOLIC PANEL
ALT: 49 IU/L — ABNORMAL HIGH (ref 0–32)
AST: 23 IU/L (ref 0–40)
Albumin/Globulin Ratio: 2 (ref 1.2–2.2)
Albumin: 4.5 g/dL (ref 3.9–4.9)
Alkaline Phosphatase: 75 IU/L (ref 44–121)
BUN/Creatinine Ratio: 12 (ref 9–23)
BUN: 8 mg/dL (ref 6–20)
Bilirubin Total: 0.7 mg/dL (ref 0.0–1.2)
CO2: 22 mmol/L (ref 20–29)
Calcium: 9.5 mg/dL (ref 8.7–10.2)
Chloride: 101 mmol/L (ref 96–106)
Creatinine, Ser: 0.67 mg/dL (ref 0.57–1.00)
Globulin, Total: 2.3 g/dL (ref 1.5–4.5)
Glucose: 92 mg/dL (ref 70–99)
Potassium: 4.2 mmol/L (ref 3.5–5.2)
Sodium: 138 mmol/L (ref 134–144)
Total Protein: 6.8 g/dL (ref 6.0–8.5)
eGFR: 117 mL/min/{1.73_m2} (ref >59)

## 2022-04-20 LAB — LIPID PANEL
Chol/HDL Ratio: 3.2 ratio (ref 0.0–4.4)
Cholesterol, Total: 205 mg/dL — ABNORMAL HIGH (ref 100–199)
HDL: 65 mg/dL (ref >39)
LDL Chol Calc (NIH): 119 mg/dL — ABNORMAL HIGH (ref 0–99)
Triglycerides: 118 mg/dL (ref 0–149)
VLDL Cholesterol Cal: 21 mg/dL (ref 5–40)

## 2022-04-20 LAB — HEMOGLOBIN A1C
Est. average glucose Bld gHb Est-mCnc: 128 mg/dL
Hgb A1c MFr Bld: 6.1 % — ABNORMAL HIGH (ref 4.8–5.6)

## 2022-04-20 LAB — TSH: TSH: 0.73 u[IU]/mL (ref 0.450–4.500)

## 2022-04-20 LAB — VITAMIN D 25 HYDROXY (VIT D DEFICIENCY, FRACTURES): Vit D, 25-Hydroxy: 57.4 ng/mL (ref 30.0–100.0)

## 2022-04-20 LAB — T4, FREE: Free T4: 1.24 ng/dL (ref 0.82–1.77)

## 2022-04-24 ENCOUNTER — Encounter: Payer: Self-pay | Admitting: Nurse Practitioner

## 2022-04-24 DIAGNOSIS — K219 Gastro-esophageal reflux disease without esophagitis: Secondary | ICD-10-CM | POA: Insufficient documentation

## 2022-04-24 DIAGNOSIS — R197 Diarrhea, unspecified: Secondary | ICD-10-CM

## 2022-04-24 HISTORY — DX: Diarrhea, unspecified: R19.7

## 2022-04-24 NOTE — Assessment & Plan Note (Signed)
Repeat labs today for monitoring.  

## 2022-04-24 NOTE — Assessment & Plan Note (Signed)
Chronic. Currently managed with trazodone at bedtime, Lamictal, and citalopram. Mood is stable. No alarm symptoms present at this time. Recommend continue current regimen and close monitoring.

## 2022-04-24 NOTE — Assessment & Plan Note (Signed)
Chronic. No symptoms at this time. She is taking metformin. We will monitor labs today for evaluation of control.

## 2022-04-24 NOTE — Assessment & Plan Note (Signed)
Historic diagnosis. No currently on statin therapy. Will monitor labs today and determine if further work up is needed. Diet and exercise recommendations provided.

## 2022-04-24 NOTE — Assessment & Plan Note (Signed)
BMI 41.42 today. We will monitor labs. Diet and exercise recommendations provided.

## 2022-04-24 NOTE — Assessment & Plan Note (Signed)

## 2022-04-24 NOTE — Assessment & Plan Note (Signed)
No alarm symptoms. Consider possible IBS as causative factor. She does endorse concerns for food allergies so we will test for that today as this certainly could be contributing. Will consider GI referral if symptoms persist.

## 2022-04-24 NOTE — Assessment & Plan Note (Signed)
Labs today for monitoring. Currently on 5000iu daily. Will make changes to plan of care as necessary based on findings.

## 2022-04-24 NOTE — Assessment & Plan Note (Signed)
>>  ASSESSMENT AND PLAN FOR PREDIABETES WRITTEN ON 04/24/2022 11:21 AM BY Lief Palmatier E, NP  Chronic. No symptoms at this time. She is taking metformin. We will monitor labs today for evaluation of control.

## 2022-04-24 NOTE — Assessment & Plan Note (Signed)
Repeat labs today for evaluation. No alarm symptoms present.

## 2022-04-24 NOTE — Assessment & Plan Note (Signed)
Chronic with intermittent nausea. At this time I recommend she take famotidine as needed to see if this is helpful with her symptoms of nausea. No alarm symptoms present.

## 2022-04-25 ENCOUNTER — Ambulatory Visit: Payer: BC Managed Care – PPO | Admitting: Nurse Practitioner

## 2022-04-25 VITALS — BP 130/78 | HR 78 | Wt 254.2 lb

## 2022-04-25 DIAGNOSIS — E785 Hyperlipidemia, unspecified: Secondary | ICD-10-CM | POA: Diagnosis not present

## 2022-04-25 DIAGNOSIS — M6289 Other specified disorders of muscle: Secondary | ICD-10-CM | POA: Diagnosis not present

## 2022-04-25 DIAGNOSIS — B379 Candidiasis, unspecified: Secondary | ICD-10-CM

## 2022-04-25 DIAGNOSIS — K5904 Chronic idiopathic constipation: Secondary | ICD-10-CM

## 2022-04-25 DIAGNOSIS — E1165 Type 2 diabetes mellitus with hyperglycemia: Secondary | ICD-10-CM

## 2022-04-25 MED ORDER — OZEMPIC (0.25 OR 0.5 MG/DOSE) 2 MG/3ML ~~LOC~~ SOPN: 0.5000 mg | PEN_INJECTOR | SUBCUTANEOUS | 1 refills | Status: DC

## 2022-04-25 MED ORDER — ONDANSETRON 8 MG PO TBDP: 8.0000 mg | ORAL_TABLET | Freq: Three times a day (TID) | ORAL | 3 refills | Status: DC | PRN

## 2022-04-25 MED ORDER — FLUCONAZOLE 150 MG PO TABS: ORAL_TABLET | ORAL | 2 refills | Status: DC

## 2022-04-25 NOTE — Progress Notes (Signed)
Yolanda Keeler, DNP, AGNP-c Yolanda Tyler  [address] [place], [place] [address] [phone]  ESTABLISHED PATIENT- Chronic Health and/or Follow-Up Visit  Blood pressure 130/78, pulse 78, weight 254 lb 3.2 oz (115.3 kg), last menstrual period 03/29/2022, SpO2 98 %.    Yolanda Tyler is a 37 y.o. year old female presenting today for evaluation and management of the following: Pelvic Floor Dysfunction vs. IBD Stina reports concerns with pelvic floor dysfunction. She reports chronic constipation and stress incontinence. She denies any symptoms of dysuria, back pain, pelvic pain. She tells me the constipation has been lifelong, but the SI is fairly new in that it has started in the last few months. She has also had a transition from daily constipation to periods of loose stools with "squishy sacks" noted in her stool with type 5 stool on the bristol stool scale.  She is interested in trying pelvic floor PT to see if this helps.   Yeast Overgrowth She also expresses concern with yeast overgrowth in her gut and on her skin. She has a history of chronic yeast infections vaginally without symptoms and has chronic treatment for fungal skin infections. Most recently she has noted breakouts on her chest when she takes oral doxycyline or clindamycin of small papules. She is concerned that this could be a cause of many of her concerns, specifically with her gut health.   Diabetes She has a long standing history of hyperglycemia with increasing BG levels despite metformin use. At this time her average blood sugars are 128 in the setting of daily high dose metformin, diet, and exercise. Her labs indicate that she has transitioned to diabetes and further treatment may be warranted. Given her BMI, she is interested in medication alternatives that may be helpful for management of both weight and blood sugars.     All ROS negative with exception of what is listed above.   PHYSICAL  EXAM Physical Exam Vitals and nursing note reviewed.  Constitutional:      General: She is not in acute distress.    Appearance: Normal appearance.  HENT:     Head: Normocephalic.  Eyes:     Extraocular Movements: Extraocular movements intact.     Conjunctiva/sclera: Conjunctivae normal.     Pupils: Pupils are equal, round, and reactive to light.  Neck:     Vascular: No carotid bruit.  Cardiovascular:     Rate and Rhythm: Normal rate and regular rhythm.     Pulses: Normal pulses.     Heart sounds: Normal heart sounds. No murmur heard. Pulmonary:     Effort: Pulmonary effort is normal.     Breath sounds: Normal breath sounds. No wheezing.  Abdominal:     General: Bowel sounds are normal. There is no distension.     Palpations: Abdomen is soft.     Tenderness: There is no abdominal tenderness. There is no guarding.  Musculoskeletal:        General: Normal range of motion.     Cervical back: Normal range of motion and neck supple.     Right lower leg: No edema.     Left lower leg: No edema.  Lymphadenopathy:     Cervical: No cervical adenopathy.  Skin:    General: Skin is warm and dry.     Capillary Refill: Capillary refill takes less than 2 seconds.  Neurological:     General: No focal deficit present.     Mental Status: She is alert and oriented to person,  place, and time.  Psychiatric:        Mood and Affect: Mood normal.        Behavior: Behavior normal.        Thought Content: Thought content normal.        Judgment: Judgment normal.     PLAN Problem List Items Addressed This Visit     Morbid obesity ([place])    Chronic with comorbidities of DM and HLD. She would greatly benefit from weight management to also help with her PFD and constipation. Will start semaglutide for blood glucose and chronic condition management. Diet and exercise reviewed. Will plan to follow up in 3 months or sooner if needed.       Relevant Medications   Semaglutide,0.25 or 0.5MG /DOS,  (OZEMPIC, 0.25 OR 0.5 MG/DOSE,) 2 MG/3ML SOPN   Hyperlipidemia    Chronic. In the setting of DM and obesity, tight control is recommended. Treatment with semaglutide started today. Diet and exercise recommendations provided. Will monitor closely. Plan to recheck labs in 6 months.       Relevant Medications   Semaglutide,0.25 or 0.5MG /DOS, (OZEMPIC, 0.25 OR 0.5 MG/DOSE,) 2 MG/3ML SOPN   Chronic idiopathic constipation    Long standing concern with intermittent loose stools present recently. I suspect a component of IBS, but based on her description, weak pelvic floor muscles are likely contributing to her difficulty with the passage of stool. Discussion of pelvic floor therapy and she would like to try this. Will send referral. GI evaluation may be needed in the future if symptoms do not improve.       Pelvic floor dysfunction in female - Primary    Symptoms appear to be consistent with PVD. I do feel that PT would be helpful for management for her. Will send referral today. We will also work on [employer] management with new medications. No signs of infection present at this time.       Relevant Orders   Ambulatory referral to Physical Therapy   Type 2 diabetes mellitus with hyperglycemia, without long-term current use of insulin (HCC)    History of chronic elevated BG levels with medication therapy. Unfortunately, her BG levels are increasing despite treatment and indicate transition to DM. I do feel that she would greatly benefit from medication to help with BG and weight management in effort to gain improved control of BG levels. We will plan to start ozempic for management today and monitor closely. Discussion on ways to help with side effects and best practices for optimal outcomes. F/U in 3 months.       Relevant Medications   Semaglutide,0.25 or 0.5MG /DOS, (OZEMPIC, 0.25 OR 0.5 MG/DOSE,) 2 MG/3ML SOPN   ondansetron (ZOFRAN-ODT) 8 MG disintegrating tablet   Candida infection    Symptoms  consistent with candidal overgrowth. In the setting of diabetes, this is understandable. Working to improve control of BG levels. Will start long term diflucan treatment to rid infection. If no improvement of symptoms, recommend culture.       Relevant Medications   fluconazole (DIFLUCAN) 150 MG tablet    No follow-ups on file.   Yolanda Keeler, DNP, AGNP-c 04/25/2022  3:46 PM

## 2022-05-01 ENCOUNTER — Other Ambulatory Visit: Payer: Self-pay | Admitting: Family Medicine

## 2022-05-01 DIAGNOSIS — L209 Atopic dermatitis, unspecified: Secondary | ICD-10-CM

## 2022-05-01 NOTE — Telephone Encounter (Signed)
Requested medication (s) are due for refill today: for review  Requested medication (s) are on the active medication list: yes    Last refill: 07/16/21  120  3 refills  Future visit scheduled no  Notes to clinic:Not delegated. Unsure if pt is still with [facility], did not reach pt for clarification.  Requested Prescriptions  Pending Prescriptions Disp Refills   ketoconazole (NIZORAL) 2 % shampoo [Pharmacy Med Name: KETOCONAZOLE 2% SHAMPOO] 120 mL 3    Sig: Apply 1 application. topically once a week.     Not Delegated - Over the Counter: OTC 2 Failed - 05/01/2022  1:29 AM      Failed - This refill cannot be delegated      Passed - Valid encounter within last 12 months    Recent Outpatient Visits           9 months ago Prediabetes   [facility] Gwyneth Sprout, FNP   10 months ago Prediabetes   [facility] [place], [place], PA-C   10 months ago Redness and swelling of upper arm   [facility] [place], [place], PA-C   1 year ago Annual physical exam   [facility] Gwyneth Sprout, [place]   1 year ago Prediabetes   [facility], Dionne Bucy, MD

## 2022-05-06 ENCOUNTER — Encounter: Payer: Self-pay | Admitting: Nurse Practitioner

## 2022-05-06 DIAGNOSIS — M6289 Other specified disorders of muscle: Secondary | ICD-10-CM | POA: Insufficient documentation

## 2022-05-06 DIAGNOSIS — B379 Candidiasis, unspecified: Secondary | ICD-10-CM

## 2022-05-06 DIAGNOSIS — E1165 Type 2 diabetes mellitus with hyperglycemia: Secondary | ICD-10-CM | POA: Insufficient documentation

## 2022-05-06 HISTORY — DX: Candidiasis, unspecified: B37.9

## 2022-05-06 NOTE — Assessment & Plan Note (Signed)
Chronic. In the setting of DM and obesity, tight control is recommended. Treatment with semaglutide started today. Diet and exercise recommendations provided. Will monitor closely. Plan to recheck labs in 6 months.

## 2022-05-06 NOTE — Assessment & Plan Note (Signed)
Symptoms consistent with candidal overgrowth. In the setting of diabetes, this is understandable. Working to improve control of BG levels. Will start long term diflucan treatment to rid infection. If no improvement of symptoms, recommend culture.

## 2022-05-06 NOTE — Assessment & Plan Note (Signed)
History of chronic elevated BG levels with medication therapy. Unfortunately, her BG levels are increasing despite treatment and indicate transition to DM. I do feel that she would greatly benefit from medication to help with BG and weight management in effort to gain improved control of BG levels. We will plan to start ozempic for management today and monitor closely. Discussion on ways to help with side effects and best practices for optimal outcomes. F/U in 3 months.

## 2022-05-06 NOTE — Assessment & Plan Note (Signed)
Chronic with comorbidities of DM and HLD. She would greatly benefit from weight management to also help with her PFD and constipation. Will start semaglutide for blood glucose and chronic condition management. Diet and exercise reviewed. Will plan to follow up in 3 months or sooner if needed.

## 2022-05-06 NOTE — Assessment & Plan Note (Signed)
Symptoms appear to be consistent with PVD. I do feel that PT would be helpful for management for her. Will send referral today. We will also work on [employer] management with new medications. No signs of infection present at this time.

## 2022-05-06 NOTE — Assessment & Plan Note (Signed)
Long standing concern with intermittent loose stools present recently. I suspect a component of IBS, but based on her description, weak pelvic floor muscles are likely contributing to her difficulty with the passage of stool. Discussion of pelvic floor therapy and she would like to try this. Will send referral. GI evaluation may be needed in the future if symptoms do not improve.

## 2022-05-24 ENCOUNTER — Other Ambulatory Visit: Payer: Self-pay | Admitting: Family Medicine

## 2022-05-24 DIAGNOSIS — R7303 Prediabetes: Secondary | ICD-10-CM

## 2022-06-06 NOTE — Therapy (Signed)
OUTPATIENT PHYSICAL THERAPY FEMALE PELVIC EVALUATION   Patient Name: Yolanda Tyler MRN: [id number] DOB:February 11, 1986, 37 y.o., female Today's Date: 06/07/2022  END OF SESSION:  PT End of Session - 06/07/22 1149     Visit Number 1    Date for PT Re-Evaluation 08/30/22    Authorization Type BCBS    PT Start Time 1015    PT Stop Time 1100    PT Time Calculation (min) 45 min    Activity Tolerance Patient tolerated treatment well    Behavior During Therapy [facility] for tasks assessed/performed             Past Medical History:  Diagnosis Date   Allergy    Anxiety    Asthma    Cholelithiasis 05/29/2021   Depression    History of colposcopy    Mental disorder    depression and anxiety   Vaginal Pap smear, abnormal    Past Surgical History:  Procedure Laterality Date   BUNIONECTOMY     CHOLECYSTECTOMY N/A 05/30/2021   Procedure: LAPAROSCOPIC CHOLECYSTECTOMY WITH INTRAOPERATIVE CHOLANGIOGRAM;  Surgeon: Jovita Kussmaul, MD;  Location: [facility];  Service: General;  Laterality: N/A;   [place] EXTRACTION     Patient Active Problem List   Diagnosis Date Noted   Pelvic floor dysfunction in female 05/06/2022   Type 2 diabetes mellitus with hyperglycemia, without long-term current use of insulin ([place]) 05/06/2022   Candida infection 05/06/2022   Diarrhea 04/24/2022   Gastroesophageal reflux disease 04/24/2022   Major depressive disorder, recurrent, moderate (HCC) 04/18/2022   Atopic dermatitis 07/16/2021   Leukocytosis 05/31/2021   Elevated liver enzymes 05/30/2021   Iron deficiency anemia 05/30/2021   Chronic asthma    Encounter for annual physical exam 04/04/2021   Anxiety and depression 09/18/2020   Chronic idiopathic constipation 09/18/2020   Avitaminosis D 09/18/2020   Prediabetes 02/03/2020   Hyperlipidemia 02/03/2020   Elevated BP without diagnosis of hypertension 02/03/2020   Morbid obesity ([place]) 12/29/2019    PCP: Orma Render, NP   REFERRING PROVIDER: Orma Render, NP   REFERRING DIAG: M62.89 (ICD-10-CM) - Pelvic floor dysfunction in female   THERAPY DIAG:  Cramp and spasm  Unspecified lack of coordination  Muscle weakness (generalized)  Rationale for Evaluation and Treatment: Rehabilitation  ONSET DATE: 2021  SUBJECTIVE:                                                                                                                                                                                           SUBJECTIVE STATEMENT: Chronic constipation her whole life and stress incontinence became an issue 3  years ago.  Fluid intake: Yes: water, soda and coffee    PAIN:  Are you having pain? No  PRECAUTIONS: None  WEIGHT BEARING RESTRICTIONS: No  FALLS:  Has patient fallen in last 6 months? No  LIVING ENVIRONMENT: Lives with: lives with their family   OCCUPATION: office work  PLOF: Independent  PATIENT GOALS: not pee when sneeze or cough  PERTINENT HISTORY:  Diabetes; cholecystectomy 05/30/21;   BOWEL MOVEMENT: Pain with bowel movement: No Type of bowel movement:Type (Bristol Stool Scale) type 6, Frequency 3-4 days, Strain Yes, and Splinting no Fully empty rectum: No Leakage: No Fiber supplement: No  URINATION: Pain with urination: No Fully empty bladder: Yes:   Stream: Strong Urgency: Yes: when she is taking her pants off to go to the bathroom Frequency: average Leakage: Coughing and Sneezing, jump roping, lifting weights Pads: No  INTERCOURSE: No pain  PREGNANCY: No pregnancies   OBJECTIVE:   DIAGNOSTIC FINDINGS:  None   COGNITION: Overall cognitive status: Within functional limits for tasks assessed     SENSATION: Light touch: Appears intact Proprioception: Appears intact   POSTURE: No Significant postural limitations  PELVIC ALIGNMENT:  LUMBARAROM/PROM: Lumbar ROM is full   LOWER EXTREMITY ROM: Bilateral hip ROM is full   LOWER EXTREMITY MMT: Bilateral hip strength  5/5   PALPATION:   General  slight tenderness along the right side of the abdomen, fascial restrictions of the lower abdomen; difficulty with contracting lower abdomen;                 External Perineal Exam perineal body, outside of anus, unable to push therapist finger out of the rectum                             Internal Pelvic Floor tightness in the puborectalis, iliococcygeus, obturator internist  Patient confirms identification and approves PT to assess internal pelvic floor and treatment Yes  PELVIC MMT:   MMT eval  Vaginal 2/5  Internal Anal Sphincter 2/5  External Anal Sphincter 2/5  Puborectalis 2/5  (Blank rows = not tested)        TONE: increased  PROLAPSE: none  TODAY'S TREATMENT:                                                                                                                              DATE: 06/07/22  EVAL finished the eval   PATIENT EDUCATION:  06/07/22 Education details: Access Code: [email or web address] Person educated: Patient Education method: Explanation, Demonstration, Tactile cues, Verbal cues, and Handouts Education comprehension: verbalized understanding, returned demonstration, verbal cues required, tactile cues required, and needs further education  HOME EXERCISE PROGRAM: 06/07/22 Access Code: [email or web address] URL: [email or web address] Date: 06/07/2022 Prepared by: Earlie Counts  Exercises - Supine Diaphragmatic Breathing  - 2 x daily - 7 x weekly - 1 sets - 10 reps - Seated Diaphragmatic Breathing  - 2 x daily -  7 x weekly - 1 sets - 10 reps  ASSESSMENT:  CLINICAL IMPRESSION: Patient is a 37 y.o. female who was seen today for physical therapy evaluation and treatment for pelvic floor dysfunction. Patient reports constipation her whole life. Her stress incontinence has started 3 years ago. She will leak with coughing, sneezing, jump rope and weight lifting. Patient has increased tone of the puborectalis, iliococcygeus, perineal  body. Pelvic floor strength is 2/5. The puborectalis does not move well with pelvic floor contraction. She is not able to push the therapist finger out of the rectum.  Patient will benefit from skilled therapy to improve pelvic floor relaxation to help with bowel movements and strength to reduce urinary leakage.   OBJECTIVE IMPAIRMENTS: decreased activity tolerance, decreased coordination, decreased endurance, decreased strength, increased fascial restrictions, and increased muscle spasms.   ACTIVITY LIMITATIONS: lifting, continence, and toileting  PARTICIPATION LIMITATIONS: community activity  PERSONAL FACTORS: Time since onset of injury/illness/exacerbation are also affecting patient's functional outcome.   REHAB POTENTIAL: Good  CLINICAL DECISION MAKING: Stable/uncomplicated  EVALUATION COMPLEXITY: Low   GOALS: Goals reviewed with patient? Yes  SHORT TERM GOALS: Target date: 07/03/22  Patient able to perform diaphragmatic breathing to perform a pelvic drop to assist with bowel movements.  Baseline: Goal status: INITIAL  2.  Patient independent with initial HEP for hip stretches and abdominal strength.  Baseline:  Goal status: INITIAL   LONG TERM GOALS: Target date: 08/30/22  Patient independent with advanced HEP for core and pelvic floor strength.  Baseline:  Goal status: INITIAL  2.  Patient is able to push the therapist finger out of the rectum to facilitate a bowel movement.  Baseline:  Goal status: INITIAL  3.  Patient pelvic floor strength is >/= 3/5 due to the ability to fully relax the pelvic floor and contract.  Baseline:  Goal status: INITIAL  4.  Patient is able to move the puborectalis forward with pelvic floor contraction due to the ability to relax.  Baseline:  Goal status: INITIAL  5.  Patient reports she is able to cough, sneeze, lift and jump rope without leaking urine due to increased in pelvic floor strength.  Baseline:  Goal status: INITIAL  6.   Patient reports she is able to push stool out without straining 50% due to improved coordination of pelvic floor.  Baseline:  Goal status: INITIAL  PLAN:  PT FREQUENCY: 1x/week  PT DURATION: 12 weeks  PLANNED INTERVENTIONS: Therapeutic exercises, Therapeutic activity, Neuromuscular re-education, Patient/Family education, Joint mobilization, Dry Needling, Biofeedback, and Manual therapy  PLAN FOR NEXT SESSION: internal work to the rectum to relax the puborectalis, diaphragmatic breathing with pelvic drop, toileting, hip stretches, manual work to abdomen   Earlie Counts, PT 06/07/22 11:50 AM

## 2022-06-07 ENCOUNTER — Ambulatory Visit: Payer: BC Managed Care – PPO | Attending: Nurse Practitioner | Admitting: Physical Therapy

## 2022-06-07 ENCOUNTER — Encounter: Payer: Self-pay | Admitting: Physical Therapy

## 2022-06-07 ENCOUNTER — Other Ambulatory Visit: Payer: Self-pay

## 2022-06-07 DIAGNOSIS — M6281 Muscle weakness (generalized): Secondary | ICD-10-CM

## 2022-06-07 DIAGNOSIS — R279 Unspecified lack of coordination: Secondary | ICD-10-CM | POA: Diagnosis not present

## 2022-06-07 DIAGNOSIS — M6289 Other specified disorders of muscle: Secondary | ICD-10-CM | POA: Diagnosis present

## 2022-06-07 DIAGNOSIS — R252 Cramp and spasm: Secondary | ICD-10-CM

## 2022-06-11 ENCOUNTER — Ambulatory Visit: Payer: BC Managed Care – PPO | Admitting: Physical Therapy

## 2022-06-20 ENCOUNTER — Encounter: Payer: Self-pay | Admitting: Nurse Practitioner

## 2022-06-20 DIAGNOSIS — E782 Mixed hyperlipidemia: Secondary | ICD-10-CM

## 2022-06-20 DIAGNOSIS — R030 Elevated blood-pressure reading, without diagnosis of hypertension: Secondary | ICD-10-CM

## 2022-06-20 DIAGNOSIS — R748 Abnormal levels of other serum enzymes: Secondary | ICD-10-CM

## 2022-06-20 DIAGNOSIS — E1165 Type 2 diabetes mellitus with hyperglycemia: Secondary | ICD-10-CM

## 2022-06-21 MED ORDER — SEMAGLUTIDE (1 MG/DOSE) 4 MG/3ML ~~LOC~~ SOPN: 1.0000 mg | PEN_INJECTOR | SUBCUTANEOUS | 3 refills | Status: DC

## 2022-07-08 ENCOUNTER — Ambulatory Visit: Payer: BC Managed Care – PPO | Attending: Nurse Practitioner | Admitting: Physical Therapy

## 2022-07-08 ENCOUNTER — Encounter: Payer: Self-pay | Admitting: Physical Therapy

## 2022-07-08 DIAGNOSIS — M6281 Muscle weakness (generalized): Secondary | ICD-10-CM | POA: Diagnosis present

## 2022-07-08 DIAGNOSIS — R252 Cramp and spasm: Secondary | ICD-10-CM | POA: Diagnosis present

## 2022-07-08 DIAGNOSIS — R279 Unspecified lack of coordination: Secondary | ICD-10-CM | POA: Diagnosis present

## 2022-07-08 NOTE — Patient Instructions (Signed)

## 2022-07-08 NOTE — Therapy (Signed)
OUTPATIENT PHYSICAL THERAPY TREATMENT NOTE   Patient Name: Yolanda Tyler MRN: [id number] DOB:[birth year], 37 y.o., female Today's Date: 07/08/2022  PCP: Tollie EthEarly, Sara E, NP  REFERRING PROVIDER: Tollie EthEarly, Sara E, NP   END OF SESSION:   PT End of Session - 07/08/22 1241     Visit Number 2    Date for PT Re-Evaluation 08/30/22    Authorization Type BCBS    PT Start Time 1230   came late   PT Stop Time 1310    PT Time Calculation (min) 40 min    Activity Tolerance Patient tolerated treatment well    [facility] for tasks assessed/performed             Past Medical History:  Diagnosis Date   Allergy    Anxiety    Asthma    Cholelithiasis 05/29/2021   Depression    History of colposcopy    Mental disorder    depression and anxiety   Vaginal Pap smear, abnormal    Past Surgical History:  Procedure Laterality Date   BUNIONECTOMY     CHOLECYSTECTOMY N/A 05/30/2021   Procedure: LAPAROSCOPIC CHOLECYSTECTOMY WITH INTRAOPERATIVE CHOLANGIOGRAM;  Surgeon: Griselda Mineroth, Paul III, MD;  Location: [facility];  Service: General;  Laterality: N/A;   WISDOM TOOTH EXTRACTION     Patient Active Problem List   Diagnosis Date Noted   Pelvic floor dysfunction in female 05/06/2022   Type 2 diabetes mellitus with hyperglycemia, without long-term current use of insulin 05/06/2022   Candida infection 05/06/2022   Diarrhea 04/24/2022   Gastroesophageal reflux disease 04/24/2022   Major depressive disorder, recurrent, moderate 04/18/2022   Atopic dermatitis 07/16/2021   Leukocytosis 05/31/2021   Elevated liver enzymes 05/30/2021   Iron deficiency anemia 05/30/2021   Chronic asthma    Encounter for annual physical exam 04/04/2021   Anxiety and depression 09/18/2020   Chronic idiopathic constipation 09/18/2020   Avitaminosis D 09/18/2020   Prediabetes 02/03/2020   Hyperlipidemia 02/03/2020   Elevated BP without diagnosis of hypertension 02/03/2020   Morbid obesity 12/29/2019    REFERRING DIAG: M62.89 (ICD-10-CM) - Pelvic floor dysfunction in female    THERAPY DIAG:  Cramp and spasm   Unspecified lack of coordination   Muscle weakness (generalized)   Rationale for Evaluation and Treatment: Rehabilitation   ONSET DATE: 2021   SUBJECTIVE:                                                                                                                                                                                            SUBJECTIVE STATEMENT: Chronic constipation her whole life and stress  incontinence became an issue 3 years ago.  Fluid intake: Yes: water, soda and coffee     PAIN:  Are you having pain? No   PRECAUTIONS: None   WEIGHT BEARING RESTRICTIONS: No   FALLS:  Has patient fallen in last 6 months? No   LIVING ENVIRONMENT: Lives with: lives with their family     OCCUPATION: office work   PLOF: Independent   PATIENT GOALS: not pee when sneeze or cough   PERTINENT HISTORY:  Diabetes; cholecystectomy 05/30/21;    BOWEL MOVEMENT: Pain with bowel movement: No Type of bowel movement:Type (Bristol Stool Scale) type 6, Frequency 3-4 days, Strain Yes, and Splinting no Fully empty rectum: No Leakage: No Fiber supplement: No   URINATION: Pain with urination: No Fully empty bladder: Yes:   Stream: Strong Urgency: Yes: when she is taking her pants off to go to the bathroom Frequency: average Leakage: Coughing and Sneezing, jump roping, lifting weights Pads: No   INTERCOURSE: No pain   PREGNANCY: No pregnancies     OBJECTIVE:    DIAGNOSTIC FINDINGS:  None     COGNITION: Overall cognitive status: Within functional limits for tasks assessed                          SENSATION: Light touch: Appears intact Proprioception: Appears intact     POSTURE: No Significant postural limitations   PELVIC ALIGNMENT:   LUMBARAROM/PROM: Lumbar ROM is full     LOWER EXTREMITY ROM: Bilateral hip ROM is full     LOWER EXTREMITY MMT:  Bilateral hip strength 5/5     PALPATION:   General  slight tenderness along the right side of the abdomen, fascial restrictions of the lower abdomen; difficulty with contracting lower abdomen;                  External Perineal Exam perineal body, outside of anus, unable to push therapist finger out of the rectum                             Internal Pelvic Floor tightness in the puborectalis, iliococcygeus, obturator internist   Patient confirms identification and approves PT to assess internal pelvic floor and treatment Yes   PELVIC MMT:   MMT eval  Vaginal 2/5  Internal Anal Sphincter 2/5  External Anal Sphincter 2/5  Puborectalis 2/5  (Blank rows = not tested)         TONE: increased   PROLAPSE: none   TODAY'S TREATMENT:      07/08/22 Manual: Internal pelvic floor techniques: No emotional/communication barriers or cognitive limitation. Patient is motivated to learn. Patient understands and agrees with treatment goals and plan. PT explains patient will be examined in standing, sitting, and lying down to see how their muscles and joints work. When they are ready, they will be asked to remove their underwear so PT can examine their perineum. The patient is also given the option of providing their own chaperone as one is not provided in our facility. The patient also has the right and is explained the right to defer or refuse any part of the evaluation or treatment including the internal exam. With the patient's consent, PT will use one gloved finger to gently assess the muscles of the pelvic floor, seeing how well it contracts and relaxes and if there is muscle symmetry. After, the patient will get dressed and PT and patient will  discuss exam findings and plan of care. PT and patient discuss plan of care, schedule, attendance policy and HEP activities. + Going through the rectum working on the puborectalis, along the iliococcygeus and using pelvic movement  Neuromuscular  re-education: Down training: Diaphragmatic breathing for the pelvic floor drop Diaphragmatic breathing to push the therapist finger out of the rectal canal.  Exercises: Stretches/mobility: Hamstring holding 30 sec bil.  Supine pull leg over the body holding 30 sec bil  Single knee to chest holding 15 sec bil.  Marjo Bicker pose but no real stretch Cat camel 10x Therapeutic activities: Functional strengthening activities: Education on correct toileting with knees above hips, diaphragmatic breathing and correct breath outwards      PATIENT EDUCATION:  07/08/22 Education details: Access Code: [email or web address], toileting techniques Person educated: Patient Education method: Explanation, Demonstration, Tactile cues, Verbal cues, and Handouts Education comprehension: verbalized understanding, returned demonstration, verbal cues required, tactile cues required, and needs further education   HOME EXERCISE PROGRAM: 07/08/22 Access Code: [email or web address] URL: [email or web address] Date: 07/08/2022 Prepared by: Eulis Foster  Exercises - Supine Diaphragmatic Breathing  - 2 x daily - 7 x weekly - 1 sets - 10 reps - Seated Diaphragmatic Breathing  - 2 x daily - 7 x weekly - 1 sets - 10 reps - Supine Hamstring Stretch  - 1 x daily - 7 x weekly - 1 sets - 2 reps - 30 hold - Supine Piriformis Stretch with Leg Straight  - 1 x daily - 7 x weekly - 1 sets - 2 reps - 30 sec hold - Cat Cow  - 1 x daily - 7 x weekly - 3 sets - 10 reps   ASSESSMENT:   CLINICAL IMPRESSION: Patient is a 37 y.o. female who was seen today for physical therapy  treatment for pelvic floor dysfunction.Patient was able to relax the pelvic floor with breath and it felt different. Patient will benefit from skilled therapy to improve pelvic floor relaxation to help with bowel movements and strength to reduce urinary leakage.    OBJECTIVE IMPAIRMENTS: decreased activity tolerance, decreased coordination, decreased endurance, decreased  strength, increased fascial restrictions, and increased muscle spasms.    ACTIVITY LIMITATIONS: lifting, continence, and toileting   PARTICIPATION LIMITATIONS: community activity   PERSONAL FACTORS: Time since onset of injury/illness/exacerbation are also affecting patient's functional outcome.    REHAB POTENTIAL: Good   CLINICAL DECISION MAKING: Stable/uncomplicated   EVALUATION COMPLEXITY: Low     GOALS: Goals reviewed with patient? Yes   SHORT TERM GOALS: Target date: 07/03/22   Patient able to perform diaphragmatic breathing to perform a pelvic drop to assist with bowel movements.  Baseline: Goal status: Met 07/07/21   2.  Patient independent with initial HEP for hip stretches and abdominal strength.  Baseline:  Goal status: INITIAL     LONG TERM GOALS: Target date: 08/30/22   Patient independent with advanced HEP for core and pelvic floor strength.  Baseline:  Goal status: INITIAL   2.  Patient is able to push the therapist finger out of the rectum to facilitate a bowel movement.  Baseline:  Goal status: INITIAL   3.  Patient pelvic floor strength is >/= 3/5 due to the ability to fully relax the pelvic floor and contract.  Baseline:  Goal status: INITIAL   4.  Patient is able to move the puborectalis forward with pelvic floor contraction due to the ability to relax.  Baseline:  Goal status: INITIAL   5.  Patient reports she is  able to cough, sneeze, lift and jump rope without leaking urine due to increased in pelvic floor strength.  Baseline:  Goal status: INITIAL   6.  Patient reports she is able to push stool out without straining 50% due to improved coordination of pelvic floor.  Baseline:  Goal status: INITIAL   PLAN:   PT FREQUENCY: 1x/week   PT DURATION: 12 weeks   PLANNED INTERVENTIONS: Therapeutic exercises, Therapeutic activity, Neuromuscular re-education, Patient/Family education, Joint mobilization, Dry Needling, Biofeedback, and Manual  therapy   PLAN FOR NEXT SESSION:  diaphragmatic breathing with pelvic drop, toileting,  manual work to abdomen   Eulis Foster, PT 07/08/22 1:17 PM

## 2022-07-11 ENCOUNTER — Encounter: Payer: Self-pay | Admitting: Nurse Practitioner

## 2022-07-11 ENCOUNTER — Ambulatory Visit: Payer: BC Managed Care – PPO | Admitting: Nurse Practitioner

## 2022-07-11 VITALS — BP 124/82 | HR 76 | Wt 249.0 lb

## 2022-07-11 DIAGNOSIS — F331 Major depressive disorder, recurrent, moderate: Secondary | ICD-10-CM

## 2022-07-11 DIAGNOSIS — E1165 Type 2 diabetes mellitus with hyperglycemia: Secondary | ICD-10-CM

## 2022-07-11 DIAGNOSIS — R030 Elevated blood-pressure reading, without diagnosis of hypertension: Secondary | ICD-10-CM | POA: Diagnosis not present

## 2022-07-11 DIAGNOSIS — E785 Hyperlipidemia, unspecified: Secondary | ICD-10-CM | POA: Diagnosis not present

## 2022-07-11 DIAGNOSIS — I8390 Asymptomatic varicose veins of unspecified lower extremity: Secondary | ICD-10-CM

## 2022-07-11 MED ORDER — TIRZEPATIDE 2.5 MG/0.5ML ~~LOC~~ SOAJ: 2.5000 mg | SUBCUTANEOUS | 0 refills | Status: DC

## 2022-07-11 NOTE — Patient Instructions (Signed)
Start the Week of 07/22/2022 I have sent in the [facility] for you.

## 2022-07-11 NOTE — Progress Notes (Signed)
Yolanda Clamp, DNP, AGNP-c [facility]  [address] [place], [place] [address] [phone]  ESTABLISHED PATIENT- Chronic Health and/or Follow-Up Visit  Blood pressure 124/82, pulse 76, weight 249 lb (112.9 kg).    Yolanda Tyler is a 37 y.o. year old female presenting today for evaluation and management of chronic conditions.   Yolanda Tyler presents today with chief complaints of debilitating exhaustion and a worsening of depressive symptoms, which she attributes to the initiation of Ozempic. She reports that these symptoms began within the first two weeks of taking the 0.25 mg dose and progressively worsened, prompting her to discontinue the medication. Since stopping Ozempic, she has noticed a slight improvement in her condition.  The patient's therapist has raised concerns that Ozempic may be contributing to the exacerbation of her depression, noting that other patients have reported similar experiences. Additionally, the patient has shared her sister's experience with Invokana, which was beneficial for weight loss and reducing swelling associated with autoimmune arthritis, but also highlighted that her sister encountered similar adverse effects with both Ozempic and Wegovy.  Expressing frustration, the patient discusses the lack of comprehensive testing and information regarding the impact of Ozempic on individuals with depression. She has resolved to self-report her experience to the FDA. Furthermore, the patient suspects a genetic predisposition to adverse reactions to certain medications, including analgesics such as Vicodin and Percocet, which she shares with her sister.  The patient also reports that Ozempic failed to suppress her appetite and did not yield any positive effects. She is inquiring about the possibility of initiating treatment with [facility] and is seeking information on its potential side effects.   In addition, she mentions a hard bump on her leg, which  she has experienced previously and has attempted to self-treat by lancing with a pin, resulting in bleeding.  All ROS negative with exception of what is listed above.   PHYSICAL EXAM Physical Exam Vitals and nursing note reviewed.  Constitutional:      General: She is not in acute distress.    Appearance: Normal appearance.  HENT:     Head: Normocephalic.  Eyes:     Extraocular Movements: Extraocular movements intact.     Conjunctiva/sclera: Conjunctivae normal.     Pupils: Pupils are equal, round, and reactive to light.  Neck:     Vascular: No carotid bruit.  Cardiovascular:     Rate and Rhythm: Normal rate and regular rhythm.     Pulses: Normal pulses.     Heart sounds: Normal heart sounds. No murmur heard. Pulmonary:     Effort: Pulmonary effort is normal.     Breath sounds: Normal breath sounds. No wheezing.  Abdominal:     General: Bowel sounds are normal. There is no distension.     Palpations: Abdomen is soft.     Tenderness: There is no abdominal tenderness. There is no guarding.  Musculoskeletal:        General: Normal range of motion.     Cervical back: Normal range of motion and neck supple.     Right lower leg: No edema.     Left lower leg: No edema.  Lymphadenopathy:     Cervical: No cervical adenopathy.  Skin:    General: Skin is warm and dry.     Capillary Refill: Capillary refill takes less than 2 seconds.     Comments: superficial varicosity present to the calf with no signs of redness, heat, or significant swelling noted  Neurological:     General:  No focal deficit present.     Mental Status: She is alert and oriented to person, place, and time.  Psychiatric:        Mood and Affect: Mood normal.        Behavior: Behavior normal.        Thought Content: Thought content normal.        Judgment: Judgment normal.     PLAN Problem List Items Addressed This Visit     Major depressive disorder, recurrent, moderate    Exacerbation of depressive  symptoms following initiation of Ozempic for diabetes. She has since stopped this medication and her mood is improving slowly. At this time, I recommend avoid restarting semaglutide and allow for a washout period prior to starting another medication. She is interested in trying [facility]. I have warned her that this could result in the same reaction so monitor closely for any signs of worsening depression and stop immediately if these are noted.  Plan: - continue to seek counseling - continue with current treatment plan - monitor very closely for signs of depression with use of mounjaro and discontinue immediately if these present.       Type 2 diabetes mellitus with hyperglycemia, without long-term current use of insulin - Primary    Chronic DM previously managed with Ozempic. Unfortunately, she has had adverse effects of severe worsening of mood on the medication, which has led to her discontinuing less than a week ago. Fortunately, her mood does appear to be improving per her report.  Her sister had success on Invokana, we discussed the option of starting a trial of that once she has given the Ozempic a few weeks to get out of her system. We also discussed the option to trial Mounjaro to see if this affects her differently.  Plan: - wait two weeks and trial Mounjaro. If any worsening depression is noted, stop the medication immediately.        Relevant Medications   tirzepatide [facility]) 2.5 MG/0.5ML Pen   Asymptomatic varicose veins   Morbid obesity   Relevant Medications   tirzepatide (MOUNJARO) 2.5 MG/0.5ML Pen   Hyperlipidemia   Relevant Medications   tirzepatide (MOUNJARO) 2.5 MG/0.5ML Pen   Elevated BP without diagnosis of hypertension   Relevant Medications   tirzepatide (MOUNJARO) 2.5 MG/0.5ML Pen    No follow-ups on file.   Yolanda Clamp, DNP, AGNP-c 07/11/2022  9:50 AM

## 2022-07-15 ENCOUNTER — Ambulatory Visit: Payer: BC Managed Care – PPO | Admitting: Physical Therapy

## 2022-07-15 ENCOUNTER — Encounter: Payer: Self-pay | Admitting: Physical Therapy

## 2022-07-15 DIAGNOSIS — R252 Cramp and spasm: Secondary | ICD-10-CM

## 2022-07-15 DIAGNOSIS — R279 Unspecified lack of coordination: Secondary | ICD-10-CM

## 2022-07-15 DIAGNOSIS — M6281 Muscle weakness (generalized): Secondary | ICD-10-CM

## 2022-07-15 NOTE — Patient Instructions (Signed)

## 2022-07-15 NOTE — Therapy (Addendum)
OUTPATIENT PHYSICAL THERAPY TREATMENT NOTE   Patient Name: Yolanda Tyler MRN: [id number] DOB:[birth year], 37 y.o., female Today's Date: 07/15/2022  PCP:  Tollie Eth, NP  REFERRING PROVIDER:  Tollie Eth, NP   END OF SESSION:   PT End of Session - 07/15/22 1453     Visit Number 3    Date for PT Re-Evaluation 08/30/22    Authorization Type BC[employer]    PT Start Time 1445    PT Stop Time 1525    PT Time Calculation (min) 40 min    Activity Tolerance Patient tolerated treatment well    Behavior During Therapy [facility] for tasks assessed/performed             Past Medical History:  Diagnosis Date   Allergy    Anxiety    Asthma    Cholelithiasis 05/29/2021   Depression    History of colposcopy    Mental disorder    depression and anxiety   Vaginal Pap smear, abnormal    Past Surgical History:  Procedure Laterality Date   BUNIONECTOMY     CHOLECYSTECTOMY N/A 05/30/2021   Procedure: LAPAROSCOPIC CHOLECYSTECTOMY WITH INTRAOPERATIVE CHOLANGIOGRAM;  Surgeon: Griselda Miner, MD;  Location: [facility];  Service: General;  Laterality: N/A;   WISDOM TOOTH EXTRACTION     Patient Active Problem List   Diagnosis Date Noted   Pelvic floor dysfunction in female 05/06/2022   Type 2 diabetes mellitus with hyperglycemia, without long-term current use of insulin 05/06/2022   Candida infection 05/06/2022   Diarrhea 04/24/2022   Gastroesophageal reflux disease 04/24/2022   Major depressive disorder, recurrent, moderate 04/18/2022   Atopic dermatitis 07/16/2021   Leukocytosis 05/31/2021   Elevated liver enzymes 05/30/2021   Iron deficiency anemia 05/30/2021   Chronic asthma    Encounter for annual physical exam 04/04/2021   Anxiety and depression 09/18/2020   Chronic idiopathic constipation 09/18/2020   Avitaminosis D 09/18/2020   Prediabetes 02/03/2020   Hyperlipidemia 02/03/2020   Elevated BP without diagnosis of hypertension 02/03/2020   Morbid obesity 12/29/2019   REFERRING DIAG:  M62.89 (ICD-10-CM) - Pelvic floor dysfunction in female    THERAPY DIAG:  Cramp and spasm   Unspecified lack of coordination   Muscle weakness (generalized)   Rationale for Evaluation and Treatment: Rehabilitation   ONSET DATE: 2021   SUBJECTIVE:                                                                                                                                                                                            SUBJECTIVE STATEMENT: Chronic constipation her whole life and stress incontinence  became an issue 3 years ago.  Fluid intake: Yes: water, soda and coffee     PAIN:  Are you having pain? No   PRECAUTIONS: None   WEIGHT BEARING RESTRICTIONS: No   FALLS:  Has patient fallen in last 6 months? No   LIVING ENVIRONMENT: Lives with: lives with their family     OCCUPATION: office work   PLOF: Independent   PATIENT GOALS: not pee when sneeze or cough   PERTINENT HISTORY:  Diabetes; cholecystectomy 05/30/21;    BOWEL MOVEMENT: Pain with bowel movement: No Type of bowel movement:Type (Bristol Stool Scale) type 6, Frequency 3-4 days, Strain Yes, and Splinting no Fully empty rectum: No Leakage: No Fiber supplement: No   URINATION: Pain with urination: No Fully empty bladder: Yes:   Stream: Strong Urgency: Yes: when she is taking her pants off to go to the bathroom Frequency: average Leakage: Coughing and Sneezing, jump roping, lifting weights Pads: No   INTERCOURSE: No pain   PREGNANCY: No pregnancies     OBJECTIVE:    DIAGNOSTIC FINDINGS:  None     COGNITION: Overall cognitive status: Within functional limits for tasks assessed                          SENSATION: Light touch: Appears intact Proprioception: Appears intact     POSTURE: No Significant postural limitations   PELVIC ALIGNMENT:   LUMBARAROM/PROM: Lumbar ROM is full     LOWER EXTREMITY ROM: Bilateral hip ROM is full     LOWER EXTREMITY MMT: Bilateral hip  strength 5/5     PALPATION:   General  slight tenderness along the right side of the abdomen, fascial restrictions of the lower abdomen; difficulty with contracting lower abdomen;                  External Perineal Exam perineal body, outside of anus, unable to push therapist finger out of the rectum                             Internal Pelvic Floor tightness in the puborectalis, iliococcygeus, obturator internist   Patient confirms identification and approves PT to assess internal pelvic floor and treatment Yes   PELVIC MMT:   MMT eval  Vaginal 2/5  Internal Anal Sphincter 2/5  External Anal Sphincter 2/5  Puborectalis 2/5  (Blank rows = not tested)         TONE: increased   PROLAPSE: none   TODAY'S TREATMENT:      07/15/22 Manual: Internal pelvic floor techniques: No emotional/communication barriers or cognitive limitation. Patient is motivated to learn. Patient understands and agrees with treatment goals and plan. PT explains patient will be examined in standing, sitting, and lying down to see how their muscles and joints work. When they are ready, they will be asked to remove their underwear so PT can examine their perineum. The patient is also given the option of providing their own chaperone as one is not provided in our facility. The patient also has the right and is explained the right to defer or refuse any part of the evaluation or treatment including the internal exam. With the patient's consent, PT will use one gloved finger to gently assess the muscles of the pelvic floor, seeing how well it contracts and relaxes and if there is muscle symmetry. After, the patient will get dressed and PT and patient will discuss  exam findings and plan of care. PT and patient discuss plan of care, schedule, attendance policy and HEP activities.  Going through the rectum working on the puborectalis to elongate with meditation and breath Trigger Point Dry-Needling  Treatment instructions:  Expect mild to moderate muscle soreness. S/S of pneumothorax if dry needled over a lung field, and to seek immediate medical attention should they occur. Patient verbalized understanding of these instructions and education.  Patient Consent Given: Yes Education handout provided: Yes Muscles treated: puborectalis Electrical stimulation performed: No Parameters: N/A Treatment response/outcome: elongation of tissue and trigger points response  Neuromuscular re-education: Down training: Discussed with patient on down training the pelvic floor with toileting on you tube  Performing the diaphragmatic breathing on the toileting you tube and opening the pelvic floor Exercises: Strengthening: Nustep for 7 minutes level 5 while assessing patient    07/08/22 Manual: Internal pelvic floor techniques: No emotional/communication barriers or cognitive limitation. Patient is motivated to learn. Patient understands and agrees with treatment goals and plan. PT explains patient will be examined in standing, sitting, and lying down to see how their muscles and joints work. When they are ready, they will be asked to remove their underwear so PT can examine their perineum. The patient is also given the option of providing their own chaperone as one is not provided in our facility. The patient also has the right and is explained the right to defer or refuse any part of the evaluation or treatment including the internal exam. With the patient's consent, PT will use one gloved finger to gently assess the muscles of the pelvic floor, seeing how well it contracts and relaxes and if there is muscle symmetry. After, the patient will get dressed and PT and patient will discuss exam findings and plan of care. PT and patient discuss plan of care, schedule, attendance policy and HEP activities. + Going through the rectum working on the puborectalis, along the iliococcygeus and using pelvic movement  Neuromuscular  re-education: Down training: Diaphragmatic breathing for the pelvic floor drop Diaphragmatic breathing to push the therapist finger out of the rectal canal.  Exercises: Stretches/mobility: Hamstring holding 30 sec bil.  Supine pull leg over the body holding 30 sec bil  Single knee to chest holding 15 sec bil.  Marjo Bicker pose but no real stretch Cat camel 10x Therapeutic activities: Functional strengthening activities: Education on correct toileting with knees above hips, diaphragmatic breathing and correct breath outwards      PATIENT EDUCATION:  07/08/22 Education details: Access Code: [email or web address], toileting techniques Person educated: Patient Education method: Explanation, Demonstration, Tactile cues, Verbal cues, and Handouts Education comprehension: verbalized understanding, returned demonstration, verbal cues required, tactile cues required, and needs further education   HOME EXERCISE PROGRAM: 07/08/22 Access Code: [email or web address] URL: [email or web address] Date: 07/08/2022 Prepared by: Eulis Foster   Exercises - Supine Diaphragmatic Breathing  - 2 x daily - 7 x weekly - 1 sets - 10 reps - Seated Diaphragmatic Breathing  - 2 x daily - 7 x weekly - 1 sets - 10 reps - Supine Hamstring Stretch  - 1 x daily - 7 x weekly - 1 sets - 2 reps - 30 hold - Supine Piriformis Stretch with Leg Straight  - 1 x daily - 7 x weekly - 1 sets - 2 reps - 30 sec hold - Cat Cow  - 1 x daily - 7 x weekly - 3 sets - 10 reps   ASSESSMENT:   CLINICAL IMPRESSION: Patient is a 36  y.o. female who was seen today for physical therapy  treatment for pelvic floor dysfunction. Patient is not able to push the therapist finger out of the rectum. She is able to open the pelvic floor with diaphragmatic breathing. Her puborectalis continues to be tight. She responded well to the dry needling. Patient will benefit from skilled therapy to improve pelvic floor relaxation to help with bowel movements and strength to  reduce urinary leakage.    OBJECTIVE IMPAIRMENTS: decreased activity tolerance, decreased coordination, decreased endurance, decreased strength, increased fascial restrictions, and increased muscle spasms.    ACTIVITY LIMITATIONS: lifting, continence, and toileting   PARTICIPATION LIMITATIONS: community activity   PERSONAL FACTORS: Time since onset of injury/illness/exacerbation are also affecting patient's functional outcome.    REHAB POTENTIAL: Good   CLINICAL DECISION MAKING: Stable/uncomplicated   EVALUATION COMPLEXITY: Low     GOALS: Goals reviewed with patient? Yes   SHORT TERM GOALS: Target date: 07/03/22   Patient able to perform diaphragmatic breathing to perform a pelvic drop to assist with bowel movements.  Baseline: Goal status: Met 07/07/21   2.  Patient independent with initial HEP for hip stretches and abdominal strength.  Baseline:  Goal status: Met 07/15/22     LONG TERM GOALS: Target date: 08/30/22   Patient independent with advanced HEP for core and pelvic floor strength.  Baseline:  Goal status: INITIAL   2.  Patient is able to push the therapist finger out of the rectum to facilitate a bowel movement.  Baseline:  Goal status: INITIAL   3.  Patient pelvic floor strength is >/= 3/5 due to the ability to fully relax the pelvic floor and contract.  Baseline:  Goal status: INITIAL   4.  Patient is able to move the puborectalis forward with pelvic floor contraction due to the ability to relax.  Baseline:  Goal status: INITIAL   5.  Patient reports she is able to cough, sneeze, lift and jump rope without leaking urine due to increased in pelvic floor strength.  Baseline:  Goal status: INITIAL   6.  Patient reports she is able to push stool out without straining 50% due to improved coordination of pelvic floor.  Baseline:  Goal status: INITIAL   PLAN:   PT FREQUENCY: 1x/week   PT DURATION: 12 weeks   PLANNED INTERVENTIONS: Therapeutic exercises,  Therapeutic activity, Neuromuscular re-education, Patient/Family education, Joint mobilization, Dry Needling, Biofeedback, and Manual therapy   PLAN FOR NEXT SESSION:  diaphragmatic breathing with pelvic drop, toileting,  manual work to abdomen; dry needling to the puborectalis   Eulis Foster, PT 07/15/22 5:06 PM    PHYSICAL THERAPY DISCHARGE SUMMARY  Visits from Start of Care: 3  Current functional level related to goals / functional outcomes: See above. Patient did not return to therapy since 07/15/2022.    Remaining deficits: See above.    Education / Equipment: HEP   Patient agrees to discharge. Patient goals were not met. Patient is being discharged due to not returning since the last visit. Thank you for the referral. Eulis Foster, PT 09/26/22 8:29 AM

## 2022-07-17 DIAGNOSIS — I8390 Asymptomatic varicose veins of unspecified lower extremity: Secondary | ICD-10-CM | POA: Insufficient documentation

## 2022-07-17 NOTE — Assessment & Plan Note (Signed)
Chronic DM previously managed with Ozempic. Unfortunately, she has had adverse effects of severe worsening of mood on the medication, which has led to her discontinuing less than a week ago. Fortunately, her mood does appear to be improving per her report.  Her sister had success on Invokana, we discussed the option of starting a trial of that once she has given the Ozempic a few weeks to get out of her system. We also discussed the option to trial Mounjaro to see if this affects her differently.  Plan: - wait two weeks and trial Mounjaro. If any worsening depression is noted, stop the medication immediately.

## 2022-07-17 NOTE — Assessment & Plan Note (Signed)
Exacerbation of depressive symptoms following initiation of Ozempic for diabetes. She has since stopped this medication and her mood is improving slowly. At this time, I recommend avoid restarting semaglutide and allow for a washout period prior to starting another medication. She is interested in trying [facility]. I have warned her that this could result in the same reaction so monitor closely for any signs of worsening depression and stop immediately if these are noted.  Plan: - continue to seek counseling - continue with current treatment plan - monitor very closely for signs of depression with use of mounjaro and discontinue immediately if these present.

## 2022-08-07 ENCOUNTER — Encounter: Payer: BC Managed Care – PPO | Admitting: Physical Therapy

## 2022-08-12 ENCOUNTER — Other Ambulatory Visit: Payer: Self-pay | Admitting: Nurse Practitioner

## 2022-08-12 DIAGNOSIS — R030 Elevated blood-pressure reading, without diagnosis of hypertension: Secondary | ICD-10-CM

## 2022-08-12 DIAGNOSIS — E1165 Type 2 diabetes mellitus with hyperglycemia: Secondary | ICD-10-CM

## 2022-08-12 DIAGNOSIS — E785 Hyperlipidemia, unspecified: Secondary | ICD-10-CM

## 2022-08-22 ENCOUNTER — Encounter: Payer: Self-pay | Admitting: Nurse Practitioner

## 2022-08-22 DIAGNOSIS — E1169 Type 2 diabetes mellitus with other specified complication: Secondary | ICD-10-CM

## 2022-08-22 DIAGNOSIS — E1165 Type 2 diabetes mellitus with hyperglycemia: Secondary | ICD-10-CM

## 2022-08-22 DIAGNOSIS — R748 Abnormal levels of other serum enzymes: Secondary | ICD-10-CM

## 2022-08-22 DIAGNOSIS — R030 Elevated blood-pressure reading, without diagnosis of hypertension: Secondary | ICD-10-CM

## 2022-08-23 MED ORDER — MOUNJARO 5 MG/0.5ML ~~LOC~~ SOAJ: 5.0000 mg | SUBCUTANEOUS | 1 refills | Status: DC

## 2022-08-23 MED ORDER — TIRZEPATIDE 7.5 MG/0.5ML ~~LOC~~ SOAJ: 7.5000 mg | SUBCUTANEOUS | 1 refills | Status: DC

## 2022-08-23 MED ORDER — TIRZEPATIDE 10 MG/0.5ML ~~LOC~~ SOAJ
10.0000 mg | SUBCUTANEOUS | 1 refills | Status: DC
Start: 2022-08-23 — End: 2022-11-27

## 2022-09-02 ENCOUNTER — Ambulatory Visit: Payer: BC Managed Care – PPO | Admitting: Physical Therapy

## 2022-09-11 ENCOUNTER — Ambulatory Visit: Payer: BC Managed Care – PPO | Admitting: Physical Therapy

## 2022-10-20 ENCOUNTER — Other Ambulatory Visit: Payer: Self-pay | Admitting: Nurse Practitioner

## 2022-10-20 DIAGNOSIS — E1165 Type 2 diabetes mellitus with hyperglycemia: Secondary | ICD-10-CM

## 2022-10-20 DIAGNOSIS — R030 Elevated blood-pressure reading, without diagnosis of hypertension: Secondary | ICD-10-CM

## 2022-10-20 DIAGNOSIS — E1169 Type 2 diabetes mellitus with other specified complication: Secondary | ICD-10-CM

## 2022-10-20 DIAGNOSIS — R748 Abnormal levels of other serum enzymes: Secondary | ICD-10-CM

## 2022-11-01 ENCOUNTER — Encounter: Payer: Self-pay | Admitting: Nurse Practitioner

## 2022-11-01 DIAGNOSIS — E1165 Type 2 diabetes mellitus with hyperglycemia: Secondary | ICD-10-CM

## 2022-11-01 DIAGNOSIS — R748 Abnormal levels of other serum enzymes: Secondary | ICD-10-CM

## 2022-11-01 DIAGNOSIS — E1169 Type 2 diabetes mellitus with other specified complication: Secondary | ICD-10-CM

## 2022-11-01 DIAGNOSIS — R030 Elevated blood-pressure reading, without diagnosis of hypertension: Secondary | ICD-10-CM

## 2022-11-06 MED ORDER — TIRZEPATIDE 7.5 MG/0.5ML ~~LOC~~ SOAJ: 7.5000 mg | SUBCUTANEOUS | 1 refills | Status: DC

## 2022-11-11 ENCOUNTER — Telehealth: Payer: BC Managed Care – PPO | Admitting: Physician Assistant

## 2022-11-11 DIAGNOSIS — B9689 Other specified bacterial agents as the cause of diseases classified elsewhere: Secondary | ICD-10-CM

## 2022-11-11 DIAGNOSIS — J208 Acute bronchitis due to other specified organisms: Secondary | ICD-10-CM

## 2022-11-11 MED ORDER — AZITHROMYCIN 250 MG PO TABS
ORAL_TABLET | ORAL | 0 refills | Status: AC
Start: 2022-11-11 — End: 2022-11-16

## 2022-11-11 MED ORDER — PREDNISONE 10 MG (21) PO TBPK
ORAL_TABLET | ORAL | 0 refills | Status: DC
Start: 2022-11-11 — End: 2023-05-02

## 2022-11-11 MED ORDER — BENZONATATE 100 MG PO CAPS
100.0000 mg | ORAL_CAPSULE | Freq: Three times a day (TID) | ORAL | 0 refills | Status: DC | PRN
Start: 2022-11-11 — End: 2023-05-02

## 2022-11-11 NOTE — Progress Notes (Signed)
E-Visit for Cough  We are sorry that you are not feeling well.  Here is how we plan to help!  Based on your presentation I believe you most likely have A cough due to bacteria.  When patients have a fever and a productive cough with a change in color or increased sputum production, we are concerned about bacterial bronchitis.  If left untreated it can progress to pneumonia.  If your symptoms do not improve with your treatment plan it is important that you contact your provider.   I have prescribed Doxycycline 100 mg twice a day for 7 days     In addition you may use A non-prescription cough medication called Mucinex DM: take 2 tablets every 12 hours. and A prescription cough medication called Tessalon Perles 100mg . You may take 1-2 capsules every 8 hours as needed for your cough.  Prednisone 10 mg daily for 6 days (see taper instructions below)  Directions for 6 day taper: Day 1: 2 tablets before breakfast, 1 after both lunch & dinner and 2 at bedtime Day 2: 1 tab before breakfast, 1 after both lunch & dinner and 2 at bedtime Day 3: 1 tab at each meal & 1 at bedtime Day 4: 1 tab at breakfast, 1 at lunch, 1 at bedtime Day 5: 1 tab at breakfast & 1 tab at bedtime Day 6: 1 tab at breakfast  From your responses in the eVisit questionnaire you describe inflammation in the upper respiratory tract which is causing a significant cough.  This is commonly called Bronchitis and has four common causes:   Allergies Viral Infections Acid Reflux Bacterial Infection Allergies, viruses and acid reflux are treated by controlling symptoms or eliminating the cause. An example might be a cough caused by taking certain blood pressure medications. You stop the cough by changing the medication. Another example might be a cough caused by acid reflux. Controlling the reflux helps control the cough.  USE OF BRONCHODILATOR ("RESCUE") INHALERS: There is a risk from using your bronchodilator too frequently.  The risk is  that over-reliance on a medication which only relaxes the muscles surrounding the breathing tubes can reduce the effectiveness of medications prescribed to reduce swelling and congestion of the tubes themselves.  Although you feel brief relief from the bronchodilator inhaler, your asthma may actually be worsening with the tubes becoming more swollen and filled with mucus.  This can delay other crucial treatments, such as oral steroid medications. If you need to use a bronchodilator inhaler daily, several times per day, you should discuss this with your provider.  There are probably better treatments that could be used to keep your asthma under control.     HOME CARE Only take medications as instructed by your medical team. Complete the entire course of an antibiotic. Drink plenty of fluids and get plenty of rest. Avoid close contacts especially the very young and the elderly Cover your mouth if you cough or cough into your sleeve. Always remember to wash your hands A steam or ultrasonic humidifier can help congestion.   GET HELP RIGHT AWAY IF: You develop worsening fever. You become short of breath You cough up blood. Your symptoms persist after you have completed your treatment plan MAKE SURE YOU  Understand these instructions. Will watch your condition. Will get help right away if you are not doing well or get worse.    Thank you for choosing an e-visit.  Your e-visit answers were reviewed by a board certified advanced clinical practitioner  to complete your personal care plan. Depending upon the condition, your plan could have included both over the counter or prescription medications.  Please review your pharmacy choice. Make sure the pharmacy is open so you can pick up prescription now. If there is a problem, you may contact your provider through [employer] and have the prescription routed to another pharmacy.  Your safety is important to [place]. If you have drug allergies check your  prescription carefully.   For the next 24 hours you can use MyChart to ask questions about today's visit, request a non-urgent call back, or ask for a work or school excuse. You will get an email in the next two days asking about your experience. I hope that your e-visit has been valuable and will speed your recovery.  I have spent 5 minutes in review of e-visit questionnaire, review and updating patient chart, medical decision making and response to patient.   Margaretann Loveless, PA-C

## 2022-11-27 ENCOUNTER — Other Ambulatory Visit: Payer: Self-pay

## 2022-11-27 DIAGNOSIS — E785 Hyperlipidemia, unspecified: Secondary | ICD-10-CM

## 2022-11-27 DIAGNOSIS — R748 Abnormal levels of other serum enzymes: Secondary | ICD-10-CM

## 2022-11-27 DIAGNOSIS — R030 Elevated blood-pressure reading, without diagnosis of hypertension: Secondary | ICD-10-CM

## 2022-11-27 DIAGNOSIS — E1165 Type 2 diabetes mellitus with hyperglycemia: Secondary | ICD-10-CM

## 2022-11-27 MED ORDER — TIRZEPATIDE 10 MG/0.5ML ~~LOC~~ SOAJ
10.0000 mg | SUBCUTANEOUS | 1 refills | Status: DC
Start: 2022-11-27 — End: 2023-05-02

## 2022-12-04 ENCOUNTER — Telehealth: Payer: Self-pay

## 2022-12-04 NOTE — Telephone Encounter (Signed)
Key: [id number] PA Case ID #: [id number] Rx #: [id number] Status: sent iconSent to Plan today Drug: Greggory Keen 10MG /0.5ML pen-injectors Form: Ambulance person PA Form [phone] NCPDP)

## 2022-12-04 NOTE — Telephone Encounter (Signed)
Pt notified via phone. Approval faxed to [facility]

## 2022-12-04 NOTE — Telephone Encounter (Signed)
Key: [id number] PA Case ID #: [id number] Rx #: [id number] Outcome: Approved today by [employer] NCPDP 2017  Your PA request has been approved. Additional information will be provided in the approval communication.   Authorization Expiration Date: 12/02/2025 Drug: Greggory Keen 10MG /0.5ML pen-injectors Form: Ambulance person PA Form [phone] NCPDP)

## 2022-12-27 NOTE — Telephone Encounter (Signed)
PA approved.

## 2023-01-01 ENCOUNTER — Other Ambulatory Visit: Payer: Self-pay | Admitting: Nurse Practitioner

## 2023-01-01 DIAGNOSIS — L209 Atopic dermatitis, unspecified: Secondary | ICD-10-CM

## 2023-01-16 ENCOUNTER — Encounter: Payer: Self-pay | Admitting: Nurse Practitioner

## 2023-01-16 ENCOUNTER — Other Ambulatory Visit: Payer: Self-pay

## 2023-01-16 MED ORDER — TIRZEPATIDE 12.5 MG/0.5ML ~~LOC~~ SOAJ: 12.5000 mg | SUBCUTANEOUS | 0 refills | Status: DC

## 2023-04-12 ENCOUNTER — Other Ambulatory Visit: Payer: Self-pay | Admitting: Nurse Practitioner

## 2023-04-13 IMAGING — US US EXTREM  UP VENOUS*R*
1 series · 13 of 24 positions shown · non-contrast
Comparison: None.

CLINICAL DATA: Right arm pain and swelling. Right forearm IV
removed 2 days ago.



[Series 1: us venous img upper uni right (dvt) · portal-venous · 13 of 47 slices shown]
[im 1/47]
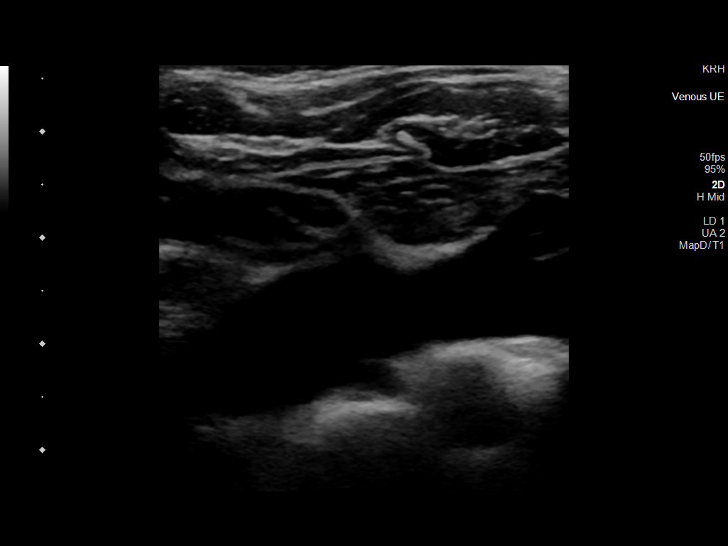
[im 5/47]
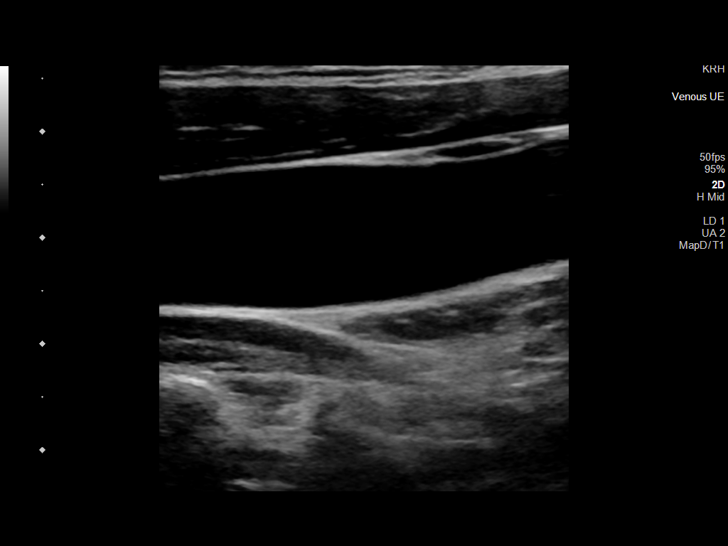
[im 9/47]
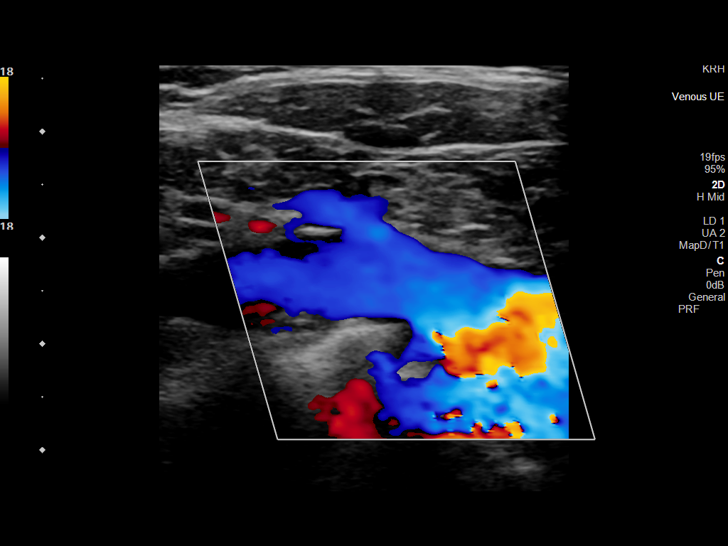
[im 13/47]
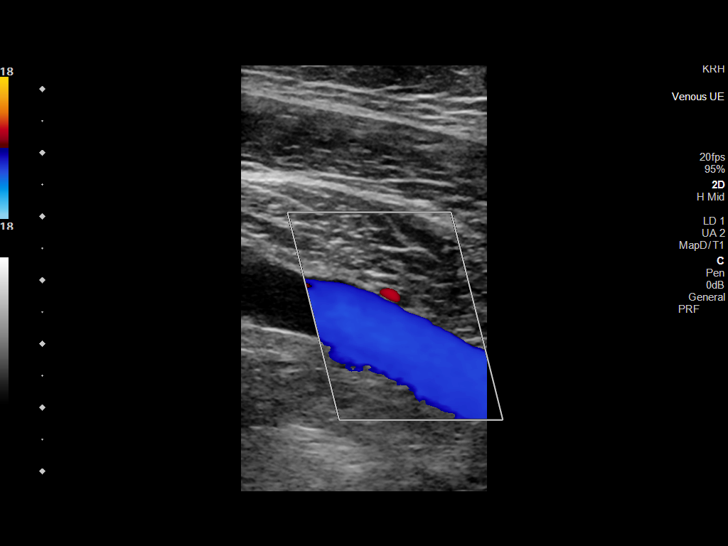
[im 17/47]
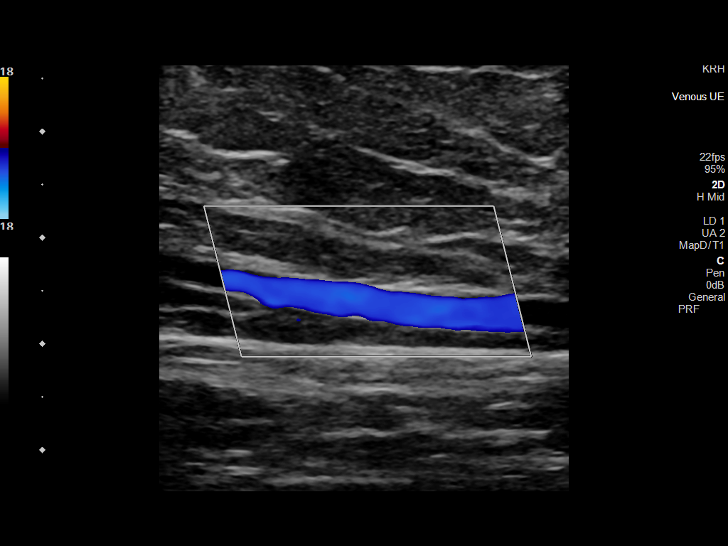
[im 21/47]
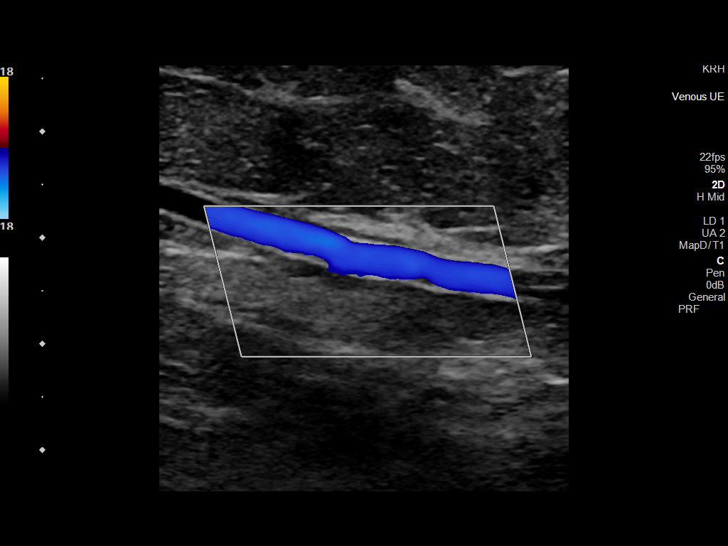
[im 25/47]
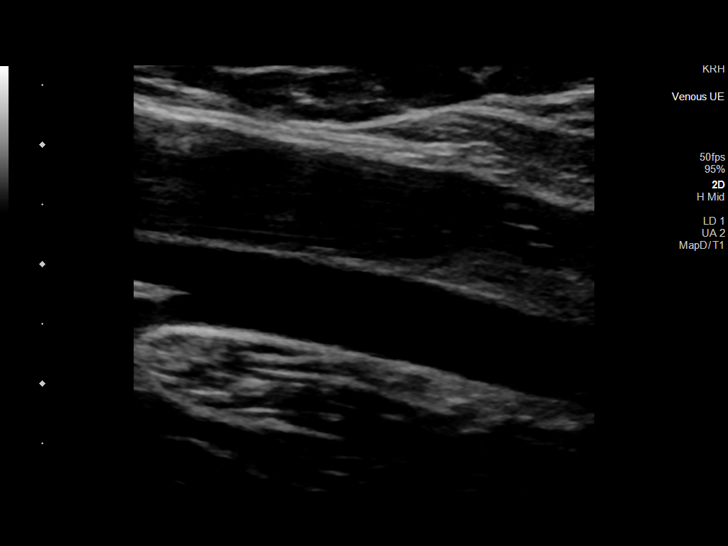
[im 27/47]
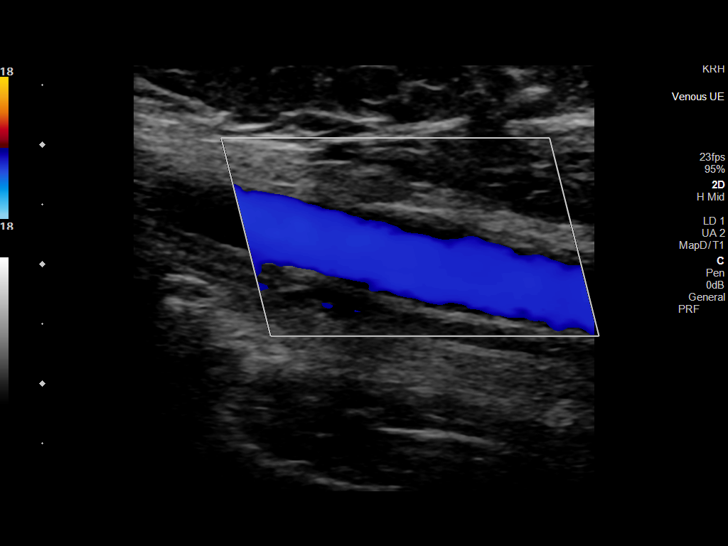
[im 31/47]
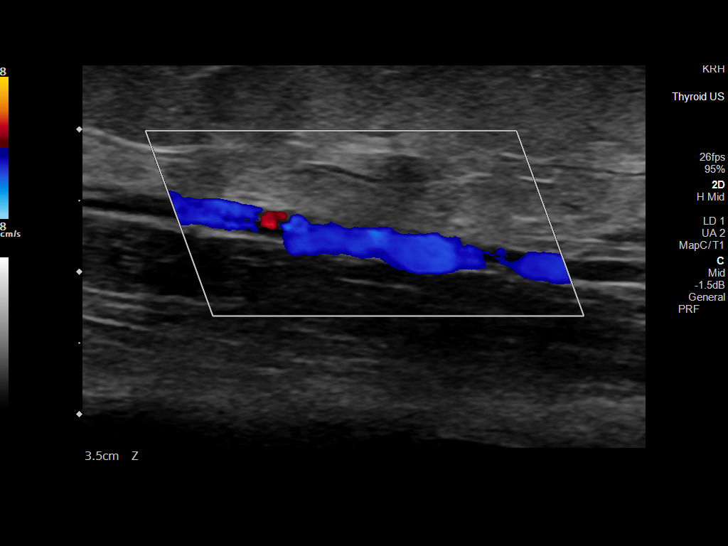
[im 35/47]
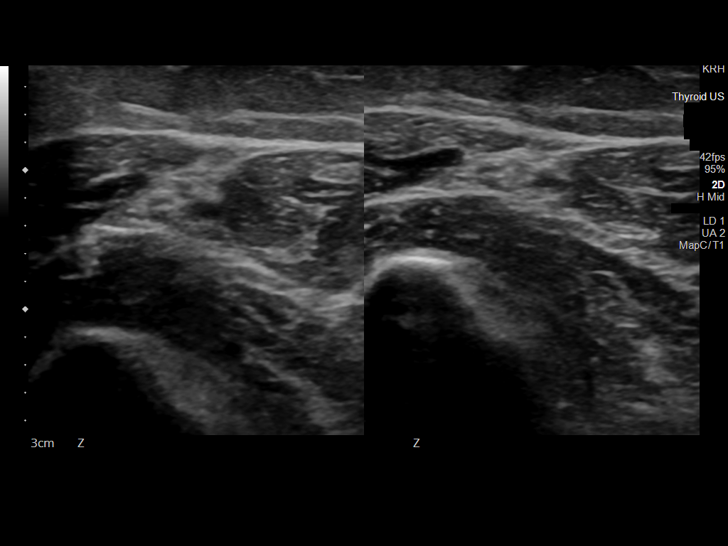
[im 39/47]
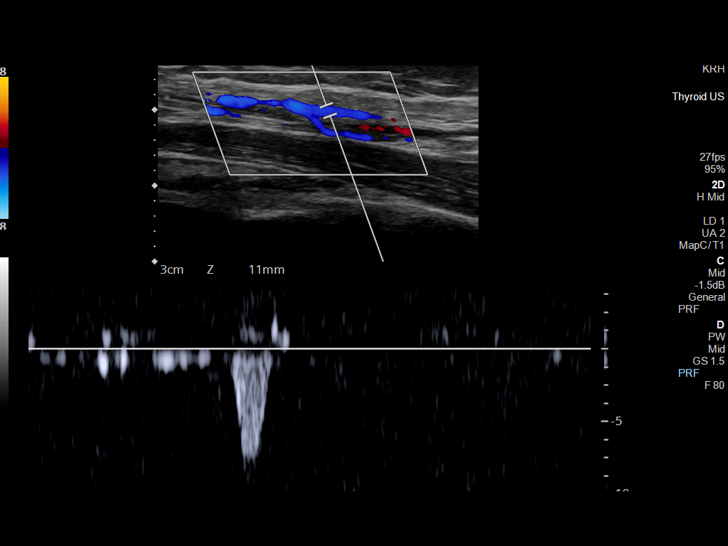
[im 43/47]
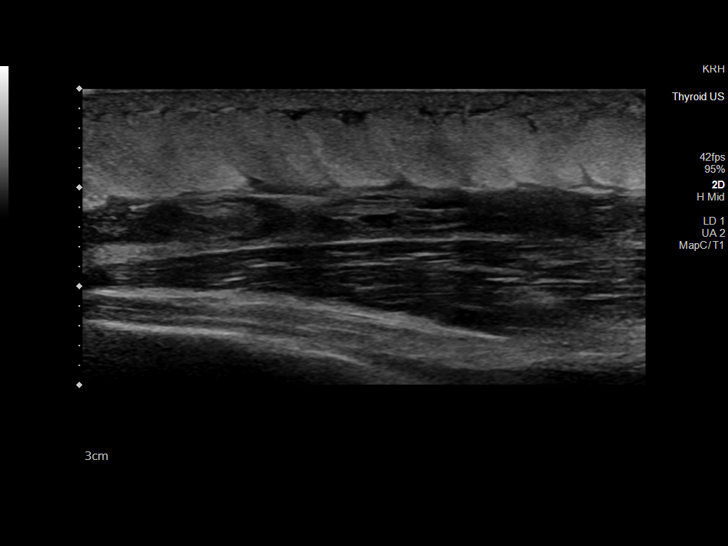
[im 47/47]
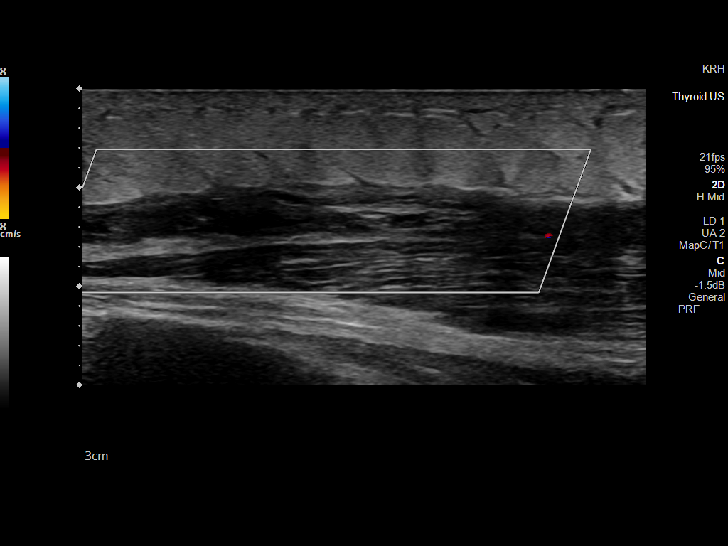

[13 of 24 positions shown; findings below may reference images not displayed]

FINDINGS: Contralateral Subclavian Vein: Respiratory phasicity is normal and
symmetric with the symptomatic side. No evidence of thrombus. Normal
compressibility.

Internal Jugular Vein: No evidence of thrombus. Normal
compressibility, respiratory phasicity and response to augmentation.

Subclavian Vein: No evidence of thrombus. Normal compressibility,
respiratory phasicity and response to augmentation.

Axillary Vein: No evidence of thrombus. Normal compressibility,
respiratory phasicity and response to augmentation.

Cephalic Vein: In the cephalic vein of the forearm, there is from
bow CIS and lack of compressibility. The cephalic vein the upper arm
through the elbow is widely patent and normally compressible.

Basilic Vein: No evidence of thrombus. Normal compressibility,
respiratory phasicity and response to augmentation.

Brachial Veins: No evidence of thrombus. Normal compressibility,
respiratory phasicity and response to augmentation.

Radial Veins: No evidence of thrombus. Normal compressibility,
respiratory phasicity and response to augmentation.

Ulnar Veins: No evidence of thrombus. Normal compressibility,
respiratory phasicity and response to augmentation.

Venous Reflux:  None visualized.

Other Findings:  None visualized.
IMPRESSION: Occlusive thrombosis of the cephalic vein in the proximal to distal
forearm. No other venous thrombosis.

## 2023-05-02 ENCOUNTER — Encounter: Payer: Self-pay | Admitting: Nurse Practitioner

## 2023-05-02 ENCOUNTER — Ambulatory Visit: Payer: 59 | Admitting: Nurse Practitioner

## 2023-05-02 VITALS — BP 124/82 | HR 91 | Wt 252.0 lb

## 2023-05-02 DIAGNOSIS — E1169 Type 2 diabetes mellitus with other specified complication: Secondary | ICD-10-CM

## 2023-05-02 DIAGNOSIS — R4189 Other symptoms and signs involving cognitive functions and awareness: Secondary | ICD-10-CM

## 2023-05-02 DIAGNOSIS — R748 Abnormal levels of other serum enzymes: Secondary | ICD-10-CM

## 2023-05-02 DIAGNOSIS — E1165 Type 2 diabetes mellitus with hyperglycemia: Secondary | ICD-10-CM | POA: Diagnosis not present

## 2023-05-02 DIAGNOSIS — F5101 Primary insomnia: Secondary | ICD-10-CM

## 2023-05-02 DIAGNOSIS — F32A Depression, unspecified: Secondary | ICD-10-CM

## 2023-05-02 DIAGNOSIS — Z23 Encounter for immunization: Secondary | ICD-10-CM | POA: Diagnosis not present

## 2023-05-02 DIAGNOSIS — F419 Anxiety disorder, unspecified: Secondary | ICD-10-CM | POA: Diagnosis not present

## 2023-05-02 DIAGNOSIS — E785 Hyperlipidemia, unspecified: Secondary | ICD-10-CM

## 2023-05-02 DIAGNOSIS — R030 Elevated blood-pressure reading, without diagnosis of hypertension: Secondary | ICD-10-CM

## 2023-05-02 MED ORDER — TEMAZEPAM 7.5 MG PO CAPS: 7.5000 mg | ORAL_CAPSULE | Freq: Every evening | ORAL | 2 refills | Status: DC | PRN

## 2023-05-02 NOTE — Assessment & Plan Note (Signed)
Difficulty staying asleep despite good sleep hygiene. Previous trazodone use was ineffective and caused brain fog. Potential mild sleep apnea considered. Discussed risks and benefits of various sleep medications, choosing Restoril due to lower risk of next-day fogginess and weight gain. - Prescribe Restoril (temazepam) - Order home sleep study - Increase magnesium to 500 mg

## 2023-05-02 NOTE — Patient Instructions (Signed)
We will check the labs today and see if there is anything that stands out as a big red flag for the medication not working.   I have sent in the order for the sleep study, we will see what that shows.   We will try the temazepam for sleep and see if you notice a difference on this.

## 2023-05-02 NOTE — Assessment & Plan Note (Signed)
Repeat labs today

## 2023-05-02 NOTE — Assessment & Plan Note (Signed)
No significant weight changes despite 20 weeks on Mounjaro and prior use of Ozempic. Possible contributing factors include stress, poor sleep, and lack of exercise. Discussed potential underlying conditions such as thyroid dysfunction or cortisol imbalance. Explained that stress and poor sleep can elevate cortisol levels, counteracting GLP-1 agonists. Discussed educational benefits of continuous glucose monitors for understanding blood sugar fluctuations. - Order A1c, thyroid function, and cortisol level tests - Consider continuous glucose monitor if covered by insurance - Continue Mounjaro as it is not causing adverse effects

## 2023-05-02 NOTE — Assessment & Plan Note (Signed)
Managed with Lamictal, Celexa, and Abilify. Recent medication adherence issues due to pharmacy error. Depression worsened when off Abilify. Emphasized importance of medication adherence and its impact on weight and mood. - Continue Lamictal, Celexa, and Abilify - Follow up with psychiatry as needed

## 2023-05-02 NOTE — Progress Notes (Signed)
Yolanda Clamp, DNP, AGNP-c [facility]  [address] [place], [place] [address] [phone]  ESTABLISHED PATIENT- Chronic Health and/or Follow-Up Visit  Blood pressure 124/82, pulse 91, weight 252 lb (114.3 kg), last menstrual period 04/05/2023.    Yolanda Tyler is a 38 y.o. year old female presenting today for evaluation and management of chronic conditions.   History of Present Illness The patient presents with lack of weight change despite medication use.  She has experienced no weight change despite being on Mounjaro for about the last year, similar to her previous experience with Ozempic. Her diet has been consistent and reduced since starting the medication, although she does report she consumed more sweets during Christmas. She generally does not eat breakfast and has not been exercising regularly, only walking occasionally.  She has not monitored her blood sugar with finger pricks recently and notes it has been a year since her last A1c test. No increased hunger, thirst, or urination. Her sister and mother have a history of thyroid issues. Her sister recently underwent gastric bypass surgery after failing Ozempic, but having success with Mounjaro until insurance no longer covered.   She reports high stress levels and poor sleep. She has attempted to discontinue trazodone, initially used to help with sleep, but it has not been effective in maintaining sleep. She describes being able to fall asleep but waking up multiple times during the night. Increasing the trazodone dose from 50 mg to 100 mg did not improve sleep and caused significant brain fog. She has tried magnesium at 400 mg without improvement in sleep maintenance. She reports waking multiple times every night with ability to fall back asleep, but only to re-awaken shortly thereafter.   She is currently on Lamictal, Celexa, and Abilify. She takes Celexa in the morning and the other medications at night.  She experienced a return of depressive symptoms after unintentionally discontinuing Abilify for a month due to a pharmacy error but has been back on it for about five weeks. She notes a possible weight gain during the period off Abilify and after stopping trazodone, although her brain fog improved after stopping trazodone.  She reports constant fatigue and never feeling rested. She has a history of jaw clenching and uses a night guard. She has not been evaluated for sleep apnea in the past but does report some snoring.    All ROS negative with exception of what is listed above.   PHYSICAL EXAM Physical Exam Vitals and nursing note reviewed.  Constitutional:      Appearance: Normal appearance. She is obese.  HENT:     Head: Normocephalic.  Eyes:     Pupils: Pupils are equal, round, and reactive to light.  Neck:     Vascular: No carotid bruit.  Cardiovascular:     Rate and Rhythm: Normal rate and regular rhythm.     Pulses: Normal pulses.     Heart sounds: Normal heart sounds.  Pulmonary:     Effort: Pulmonary effort is normal.     Breath sounds: Normal breath sounds.  Abdominal:     General: Bowel sounds are normal. There is no distension.     Palpations: Abdomen is soft.     Tenderness: There is no abdominal tenderness. There is no guarding.  Musculoskeletal:        General: Normal range of motion.     Cervical back: Normal range of motion.     Right lower leg: No edema.     Left lower  leg: No edema.  Skin:    General: Skin is warm.  Neurological:     General: No focal deficit present.     Mental Status: She is alert and oriented to person, place, and time.  Psychiatric:        Mood and Affect: Mood normal.      PLAN Problem List Items Addressed This Visit     Morbid obesity (HCC)   No significant weight changes despite 20 weeks on Mounjaro and prior use of Ozempic. Possible contributing factors include stress, poor sleep, and lack of exercise. Discussed potential  underlying conditions such as thyroid dysfunction or cortisol imbalance. Explained that stress and poor sleep can elevate cortisol levels, counteracting GLP-1 agonists. Discussed educational benefits of continuous glucose monitors for understanding blood sugar fluctuations. - Order A1c, thyroid function, and cortisol level tests - Consider continuous glucose monitor if covered by insurance - Continue Mounjaro as it is not causing adverse effects       Relevant Orders   Cortisol   Home sleep test   Hemoglobin A1c   CBC with Differential/Platelet   Comprehensive metabolic panel   TSH   T4, free   Insulin, random   Elevated BP without diagnosis of hypertension   Blood pressure stable with no concerns present today. Not currently on medication. Will continue to monitor.       Anxiety and depression   Managed with Lamictal, Celexa, and Abilify. Recent medication adherence issues due to pharmacy error. Depression worsened when off Abilify. Emphasized importance of medication adherence and its impact on weight and mood. - Continue Lamictal, Celexa, and Abilify - Follow up with psychiatry as needed      Elevated liver enzymes   Repeat labs today!!      Relevant Orders   Comprehensive metabolic panel   Primary insomnia   Difficulty staying asleep despite good sleep hygiene. Previous trazodone use was ineffective and caused brain fog. Potential mild sleep apnea considered. Discussed risks and benefits of various sleep medications, choosing Restoril due to lower risk of next-day fogginess and weight gain. - Prescribe Restoril (temazepam) - Order home sleep study - Increase magnesium to 500 mg      Relevant Medications   temazepam (RESTORIL) 7.5 MG capsule   Other Relevant Orders   Home sleep test   Type 2 diabetes mellitus with hyperglycemia, without long-term current use of insulin (HCC) - Primary   Relevant Orders   Cortisol   Hemoglobin A1c   CBC with Differential/Platelet    Comprehensive metabolic panel   TSH   T4, free   Insulin, random   Hyperlipidemia   Other Visit Diagnoses       Need for influenza vaccination       Relevant Orders   Flu vaccine trivalent PF, 6mos and older(Flulaval,Afluria,Fluarix,Fluzone) (Completed)     Hyperlipidemia associated with type 2 diabetes mellitus (HCC)         Brain fog       Relevant Medications   temazepam (RESTORIL) 7.5 MG capsule   Other Relevant Orders   Home sleep test     ADDENDUM: Patient requested hormone levels to be added to labs to determine if this could be a factor. Request to add estradiol and testosterone. Cortisol and insulin already part of the labs.   Return in about 6 months (around 10/30/2023) for Med Management 30.  SaraBeth Betha Shadix, DNP, AGNP-c Time: 45 minutes, >50% spent counseling, care coordination, chart review, and documentation.

## 2023-05-02 NOTE — Assessment & Plan Note (Signed)
Blood pressure stable with no concerns present today. Not currently on medication. Will continue to monitor.

## 2023-05-05 ENCOUNTER — Other Ambulatory Visit: Payer: 59

## 2023-05-06 ENCOUNTER — Other Ambulatory Visit: Payer: Self-pay | Admitting: Nurse Practitioner

## 2023-05-06 ENCOUNTER — Encounter: Payer: Self-pay | Admitting: Nurse Practitioner

## 2023-05-06 ENCOUNTER — Other Ambulatory Visit: Payer: Self-pay

## 2023-05-06 DIAGNOSIS — L209 Atopic dermatitis, unspecified: Secondary | ICD-10-CM

## 2023-05-06 LAB — HEMOGLOBIN A1C
Est. average glucose Bld gHb Est-mCnc: 120 mg/dL
Hgb A1c MFr Bld: 5.8 % — ABNORMAL HIGH (ref 4.8–5.6)

## 2023-05-06 LAB — CBC WITH DIFFERENTIAL/PLATELET
Basophils Absolute: 0.1 10*3/uL (ref 0.0–0.2)
Basos: 1 %
EOS (ABSOLUTE): 0.2 10*3/uL (ref 0.0–0.4)
Eos: 3 %
Hematocrit: 40.6 % (ref 34.0–46.6)
Hemoglobin: 13.1 g/dL (ref 11.1–15.9)
Immature Grans (Abs): 0 10*3/uL (ref 0.0–0.1)
Immature Granulocytes: 0 %
Lymphocytes Absolute: 2 10*3/uL (ref 0.7–3.1)
Lymphs: 31 %
MCH: 28.1 pg (ref 26.6–33.0)
MCHC: 32.3 g/dL (ref 31.5–35.7)
MCV: 87 fL (ref 79–97)
Monocytes Absolute: 0.5 10*3/uL (ref 0.1–0.9)
Monocytes: 8 %
Neutrophils Absolute: 3.6 10*3/uL (ref 1.4–7.0)
Neutrophils: 57 %
Platelets: 347 10*3/uL (ref 150–450)
RBC: 4.66 x10E6/uL (ref 3.77–5.28)
RDW: 12.4 % (ref 11.7–15.4)
WBC: 6.4 10*3/uL (ref 3.4–10.8)

## 2023-05-06 LAB — COMPREHENSIVE METABOLIC PANEL
ALT: 23 IU/L (ref 0–32)
AST: 20 IU/L (ref 0–40)
Albumin: 4.4 g/dL (ref 3.9–4.9)
Alkaline Phosphatase: 68 IU/L (ref 44–121)
BUN/Creatinine Ratio: 15 (ref 9–23)
BUN: 12 mg/dL (ref 6–20)
Bilirubin Total: 0.3 mg/dL (ref 0.0–1.2)
CO2: 22 mmol/L (ref 20–29)
Calcium: 9.7 mg/dL (ref 8.7–10.2)
Chloride: 101 mmol/L (ref 96–106)
Creatinine, Ser: 0.82 mg/dL (ref 0.57–1.00)
Globulin, Total: 2.3 g/dL (ref 1.5–4.5)
Glucose: 95 mg/dL (ref 70–99)
Potassium: 4.2 mmol/L (ref 3.5–5.2)
Sodium: 137 mmol/L (ref 134–144)
Total Protein: 6.7 g/dL (ref 6.0–8.5)
eGFR: 95 mL/min/{1.73_m2} (ref >59)

## 2023-05-06 LAB — CORTISOL: Cortisol: 11.4 ug/dL (ref 6.2–19.4)

## 2023-05-06 LAB — T4, FREE: Free T4: 0.94 ng/dL (ref 0.82–1.77)

## 2023-05-06 LAB — INSULIN, RANDOM: INSULIN: 22.1 u[IU]/mL (ref 2.6–24.9)

## 2023-05-06 LAB — TSH: TSH: 1.24 u[IU]/mL (ref 0.450–4.500)

## 2023-05-06 MED ORDER — HYDROCORTISONE 2.5 % EX LOTN: 1.0000 | TOPICAL_LOTION | Freq: Every day | CUTANEOUS | 3 refills | Status: AC | PRN

## 2023-05-07 LAB — TESTOSTERONE,FREE AND TOTAL
Testosterone, Free: 1.4 pg/mL (ref 0.0–4.2)
Testosterone: 13 ng/dL (ref 8–60)

## 2023-05-07 LAB — SPECIMEN STATUS REPORT

## 2023-05-07 LAB — ESTRADIOL: Estradiol: 54.8 pg/mL

## 2023-05-08 ENCOUNTER — Encounter: Payer: Self-pay | Admitting: Nurse Practitioner

## 2023-05-08 ENCOUNTER — Other Ambulatory Visit: Payer: Self-pay

## 2023-05-08 MED ORDER — TIRZEPATIDE 15 MG/0.5ML ~~LOC~~ SOAJ: 15.0000 mg | SUBCUTANEOUS | 0 refills | Status: DC

## 2023-06-02 ENCOUNTER — Other Ambulatory Visit (HOSPITAL_COMMUNITY): Payer: Self-pay

## 2023-06-04 ENCOUNTER — Other Ambulatory Visit (HOSPITAL_COMMUNITY): Payer: Self-pay

## 2023-06-04 ENCOUNTER — Telehealth: Payer: Self-pay

## 2023-06-04 NOTE — Telephone Encounter (Signed)
 Pharmacy Patient Advocate Encounter   Received notification from CoverMyMeds that prior authorization for Temazepam 7.5MG  capsules is required/requested.   Insurance verification completed.   The patient is insured through [facility] .   Per test claim: PA required; PA submitted to above mentioned insurance via CoverMyMeds Key/confirmation #/EOC (Key: [id number])      Status is pending

## 2023-06-05 ENCOUNTER — Other Ambulatory Visit (HOSPITAL_COMMUNITY): Payer: Self-pay

## 2023-06-06 ENCOUNTER — Other Ambulatory Visit (HOSPITAL_COMMUNITY): Payer: Self-pay

## 2023-06-06 NOTE — Telephone Encounter (Signed)
 Hi Huntley Dec and Adam,                        I have received A denial from the patients Ins for Temazepam. The plan has denied the P/a due to their ''post Limits/ exceeding the #qty that Ins is willing to pay for) Insurance is only willing to cover #[phone]. However an Appeal can be (Attempted) to see if they are willing to cover #[id number]. Please advise.

## 2023-06-11 NOTE — Telephone Encounter (Signed)
 Please call Yolanda Tyler to let her know that her insurance will only cover 15 of the temazepam 7.5mg  per month. Hopefully this is effective enough to help with her sleep patterns. If she is unable to sleep without use, please let me know and we can attempt an appeal.   Please ask all sleep medications she has tried in the past if she would like an appeal.

## 2023-06-29 ENCOUNTER — Other Ambulatory Visit: Payer: Self-pay | Admitting: Nurse Practitioner

## 2023-06-29 DIAGNOSIS — L209 Atopic dermatitis, unspecified: Secondary | ICD-10-CM

## 2023-08-21 ENCOUNTER — Other Ambulatory Visit: Payer: Self-pay | Admitting: Nurse Practitioner

## 2023-08-29 ENCOUNTER — Other Ambulatory Visit: Payer: Self-pay | Admitting: Nurse Practitioner

## 2023-08-29 DIAGNOSIS — L209 Atopic dermatitis, unspecified: Secondary | ICD-10-CM

## 2023-10-27 ENCOUNTER — Other Ambulatory Visit: Payer: Self-pay | Admitting: Nurse Practitioner

## 2023-11-11 ENCOUNTER — Encounter: Payer: Self-pay | Admitting: Nurse Practitioner

## 2023-11-11 ENCOUNTER — Ambulatory Visit: Admitting: Nurse Practitioner

## 2023-11-11 VITALS — BP 124/84 | HR 80 | Wt 261.2 lb

## 2023-11-11 DIAGNOSIS — F419 Anxiety disorder, unspecified: Secondary | ICD-10-CM | POA: Diagnosis not present

## 2023-11-11 DIAGNOSIS — F5101 Primary insomnia: Secondary | ICD-10-CM

## 2023-11-11 DIAGNOSIS — E1165 Type 2 diabetes mellitus with hyperglycemia: Secondary | ICD-10-CM

## 2023-11-11 DIAGNOSIS — R5382 Chronic fatigue, unspecified: Secondary | ICD-10-CM | POA: Diagnosis not present

## 2023-11-11 DIAGNOSIS — R600 Localized edema: Secondary | ICD-10-CM | POA: Insufficient documentation

## 2023-11-11 DIAGNOSIS — R2242 Localized swelling, mass and lump, left lower limb: Secondary | ICD-10-CM | POA: Insufficient documentation

## 2023-11-11 DIAGNOSIS — F32A Depression, unspecified: Secondary | ICD-10-CM

## 2023-11-11 NOTE — Assessment & Plan Note (Signed)
 Weight gain despite Mounjaro  for over a year. Reports fullness and decreased hunger, but no weight loss. Engages in CrossFit thrice weekly. Possible factors: insufficient caloric intake, sleep disturbances, enzyme deficiency affecting medication metabolism. No desire for weight loss surgery. 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 - Advise increasing protein intake in the morning, possibly using Premier Protein as a coffee creamer.

## 2023-11-11 NOTE — Assessment & Plan Note (Signed)
 Intermittent swelling of feet, especially after travel or consuming processed foods. Likely due to fluid retention from dietary sodium and prolonged sitting. No signs of thrombosis or infection. History of blood clot post-gallbladder surgery. - Advise increasing water intake during travel. - Recommend wearing compression stockings during travel.

## 2023-11-11 NOTE — Progress Notes (Signed)
 Yolanda Doing, DNP, AGNP-c [facility]  [address] [place], [place] [address] [phone]  ESTABLISHED PATIENT- Chronic Health and/or Follow-Up Visit  Blood pressure 124/84, pulse 80, weight 261 lb 3.2 oz (118.5 kg).    Yolanda Tyler is a 38 y.o. year old female presenting today for evaluation and management of chronic conditions.   She has been experiencing weight gain despite being on Mounjaro  for over a year. She started attending CrossFit sessions three times a week in February, although her routine was less regular over the summer due to vacation and work travel. Despite a significantly curbed appetite and eating less, she has not experienced weight loss. She typically does not eat until 1 or 2 PM, starting her day with a cup of coffee. She skipped a week of Mounjaro  due to a prescription issue and noticed increased hunger during that time. Her last blood work showed a slightly elevated A1c, though it had decreased from previous levels.  She experienced severe acid reflux and vomiting after consuming homemade meatballs and fries, with visible stomach contents 24 hours later. This incident occurred after a gym session, and she notes that such symptoms are unusual for her.  She expresses constant fatigue, describing herself as 'a hundred percent tired all of the time.' She has not undergone a sleep test due to insurance coverage issues. She has a history of depression and has historically used sleep as a coping mechanism. Recently she has found it difficult to sleep at night. Her psychiatrist recently changed her medication from temazepam  to doxepin, due to increased risks of dementia with temazepam . She finds the doxepin helpful. She also takes methylphenidate for focus but reports no increase in energy. She has tried Adderall without effect. No anxiety, occasional snoring, and waking up throughout the night. No excessive waking on her current medications.  She  experiences occasional swelling in her feet, particularly after traveling or consuming processed foods. She associates this with eating processed foods for extended periods. She experiences swelling in her hands during long walks or hikes. She has a history of a blood clot following gallbladder surgery.  She has a lump on her left anterior ankle described as a non-painful.   All ROS negative with exception of what is listed above.   PHYSICAL EXAM Physical Exam Vitals and nursing note reviewed.  Constitutional:      General: She is not in acute distress.    Appearance: Normal appearance. She is obese.  HENT:     Head: Normocephalic.  Eyes:     Conjunctiva/sclera: Conjunctivae normal.     Pupils: Pupils are equal, round, and reactive to light.  Neck:     Vascular: No carotid bruit.  Cardiovascular:     Rate and Rhythm: Normal rate and regular rhythm.     Pulses: Normal pulses.     Heart sounds: Normal heart sounds.  Pulmonary:     Effort: Pulmonary effort is normal.     Breath sounds: Normal breath sounds.  Musculoskeletal:     Right lower leg: No edema.     Left lower leg: No edema.  Skin:    General: Skin is warm and dry.     Capillary Refill: Capillary refill takes less than 2 seconds.      Neurological:     Mental Status: She is alert and oriented to person, place, and time.     Sensory: No sensory deficit.     Motor: No weakness.     Gait: Gait  normal.  Psychiatric:        Mood and Affect: Mood normal.        Behavior: Behavior normal.      PLAN Problem List Items Addressed This Visit     Morbid obesity (HCC)   Weight gain despite Mounjaro  for over a year. Reports fullness and decreased hunger, but no weight loss. Engages in CrossFit thrice weekly. Possible factors: insufficient caloric intake, sleep disturbances, enzyme deficiency affecting medication metabolism. No desire for weight loss surgery. Furniture conservator/restorer for insights on weight  gain while on Mounjaro . - Advise increasing protein intake in the morning, possibly using Premier Protein as a coffee creamer.      Anxiety and depression   Depression well-managed with current psychiatric medications. Previous trial of Ozempic  resulted in adverse effects on mental health- will avoid this.       Relevant Medications   doxepin (SINEQUAN) 10 MG capsule   Type 2 diabetes mellitus with hyperglycemia, without long-term current use of insulin  (HCC)   Chronic DM previously managed with Ozempic . Unfortunately, she has had adverse effects of severe worsening of mood on the medication. Since starting Mounjaro  her blood sugars have improved, but she continues to gain weight despite diet and exercise. I will discuss this finding with the pharmaceutical rep to determine if there is anything that can be done.       Primary insomnia   Chronic insomnia with poor sleep quality. Currently on doxepin, which is helpful. Previous use of temazepam , but stopped by psychiatry due to concerns for dementia risk. No significant improvement in energy levels with methylphenidate or Adderall.        Bilateral leg edema - Primary   Intermittent swelling of feet, especially after travel or consuming processed foods. Likely due to fluid retention from dietary sodium and prolonged sitting. No signs of thrombosis or infection. History of blood clot post-gallbladder surgery. - Advise increasing water intake during travel. - Recommend wearing compression stockings during travel.       Chronic fatigue   Persistent fatigue with no clear etiology. Diagnosis of exclusion. Insurance does not cover sleep study. High stress levels and poor sleep quality may contribute. Possible chronic fatigue syndrome, but no definitive diagnosis due to lack of exclusionary testing. Considered stimulant for management, but already on stimulant for ADHD with no improvement in symptoms. Recommend rest when needed. Will monitor labs  to determine if any causative factor can be discovered.        Lump of skin of lower extremity, left   Non-tender, mobile subcutaneous lump, likely a lipoma or cyst. No associated symptoms or rapid growth. - Monitor for changes in size or symptoms and report these immediately.  - Consider ultrasound if growing or changing.        Return if symptoms worsen or fail to improve.  SaraBeth Donal Lynam, DNP, AGNP-c Time: 44 minutes, >50% spent counseling, care coordination, chart review, and documentation.

## 2023-11-11 NOTE — Assessment & Plan Note (Signed)
 Persistent fatigue with no clear etiology. Diagnosis of exclusion. Insurance does not cover sleep study. High stress levels and poor sleep quality may contribute. Possible chronic fatigue syndrome, but no definitive diagnosis due to lack of exclusionary testing. Considered stimulant for management, but already on stimulant for ADHD with no improvement in symptoms. Recommend rest when needed. Will monitor labs to determine if any causative factor can be discovered.

## 2023-11-11 NOTE — Assessment & Plan Note (Addendum)
 Non-tender, mobile subcutaneous lump, likely a lipoma or cyst. No associated symptoms or rapid growth. - Monitor for changes in size or symptoms and report these immediately.  - Consider ultrasound if growing or changing.

## 2023-11-11 NOTE — Patient Instructions (Addendum)
 15-9mmHg is a good compression for stocking.  Premier Protein can be used in your coffee to help increase protein.     WEIGHT LOSS PLANNING  For best management of weight, it is vital to balance intake versus output. This means the number of calories burned per day must be less than the calories you take in with food and drink.   I recommend trying to follow a diet with the following: Calories: 1200-1500 calories per day Carbohydrates: 150-180 grams of carbohydrates per day  Why: Gives your body enough quick fuel for cells to maintain normal function without sending them into starvation mode.  Protein: At least 90 grams of protein per day- 30 grams with each meal Why: Protein takes longer and uses more energy than carbohydrates to break down for fuel. The carbohydrates in your meals serves as quick energy sources and proteins help use some of that extra quick energy to break down to produce long term energy. This helps you not feel hungry as quickly and protein breakdown burns calories.  Water: Drink AT LEAST 64 ounces of water per day  Why: Water is essential to healthy metabolism. Water helps to fill the stomach and keep you fuller longer. Water is required for healthy digestion and filtering of waste in the body.  Fat: Limit fats in your diet- when choosing fats, choose foods with lower fats content such as lean meats (chicken, fish, [place]).  Why: Increased fat intake leads to storage for later. Once you burn your carbohydrate energy, your body goes into fat and protein breakdown mode to help you loose weight.  Cholesterol: Fats and oils that are LIQUID at room temperature are best. Choose vegetable oils (olive oil, avocado oil, nuts). Avoid fats that are SOLID at room temperature (animal fats, processed meats). Healthy fats are often found in whole grains, beans, nuts, seeds, and berries.  Why: Elevated cholesterol levels lead to build up of cholesterol on the inside of your blood  vessels. This will eventually cause the blood vessels to become hard and can lead to high blood pressure and damage to your organs. When the blood flow is reduced, but the pressure is high from cholesterol buildup, parts of the cholesterol can break off and form clots that can go to the brain or heart leading to a stroke or heart attack.  Fiber: Increase amount of SOLUBLE the fiber in your diet. This helps to fill you up, lowers cholesterol, and helps with digestion. Some foods high in soluble fiber are oats, peas, beans, apples, carrots, barley, and citrus fruits.   Why: Fiber fills you up, helps remove excess cholesterol, and aids in healthy digestion which are all very important in weight management.   I recommend the following as a minimum activity routine: Purposeful walk or other physical activity at least 20 minutes every single day. This means purposefully taking a walk, jog, bike, swim, treadmill, elliptical, dance, etc.  This activity should be ABOVE your normal daily activities, such as walking at work. Goal exercise should be at least 150 minutes a week- work your way up to this.   Heart Rate: Your maximum exercise heart rate should be 220 - Your Age in Years. When exercising, get your heart rate up, but avoid going over the maximum targeted heart rate.  60-70% of your maximum heart rate is where you tend to burn the most fat. To find this number:  220 - Age In Years= Max HR  Max HR x 0.6 (or 0.7) =  Fat Burning HR The Fat Burning HR is your goal heart rate while working out to burn the most fat.  NEVER exercise to the point your feel lightheaded, weak, nauseated, dizzy. If you experience ANY of these symptoms- STOP exercise! Allow yourself to cool down and your heart rate to come down. Then restart slower next time.  If at ANY TIME you feel chest pain or chest pressure during exercise, STOP IMMEDIATELY and seek medical attention.

## 2023-11-11 NOTE — Assessment & Plan Note (Signed)
 Chronic insomnia with poor sleep quality. Currently on doxepin, which is helpful. Previous use of temazepam , but stopped by psychiatry due to concerns for dementia risk. No significant improvement in energy levels with methylphenidate or Adderall.

## 2023-11-11 NOTE — Assessment & Plan Note (Signed)
 Depression well-managed with current psychiatric medications. Previous trial of Ozempic  resulted in adverse effects on mental health- will avoid this.

## 2023-11-11 NOTE — Assessment & Plan Note (Signed)
 Chronic DM previously managed with Ozempic . Unfortunately, she has had adverse effects of severe worsening of mood on the medication. Since starting Mounjaro  her blood sugars have improved, but she continues to gain weight despite diet and exercise. I will discuss this finding with the pharmaceutical rep to determine if there is anything that can be done.

## 2023-12-02 ENCOUNTER — Other Ambulatory Visit: Payer: Self-pay | Admitting: Nurse Practitioner

## 2024-05-21 ENCOUNTER — Encounter: Payer: Self-pay | Admitting: Nurse Practitioner

## 2024-12-10 ENCOUNTER — Encounter: Payer: Self-pay | Admitting: Nurse Practitioner
